# Patient Record
Sex: Female | Born: 1965
Health system: Southern US, Community
[De-identification: ages and names within clinical notes are randomized; demographics above are authoritative.]

## PROBLEM LIST (undated history)

## (undated) DIAGNOSIS — D214 Benign neoplasm of connective and other soft tissue of abdomen: Secondary | ICD-10-CM

## (undated) DIAGNOSIS — E039 Hypothyroidism, unspecified: Secondary | ICD-10-CM

## (undated) DIAGNOSIS — D499 Neoplasm of unspecified behavior of unspecified site: Secondary | ICD-10-CM

## (undated) HISTORY — DX: Hypothyroidism, unspecified: E03.9

## (undated) HISTORY — PX: RESECTION SMALL BOWEL / CLOSURE ILEOSTOMY: SUR1248

## (undated) HISTORY — DX: Benign neoplasm of connective and other soft tissue of abdomen: D21.4

## (undated) HISTORY — PX: OTHER SURGICAL HISTORY: SHX169

## (undated) HISTORY — PX: TUBAL LIGATION: SHX77

## (undated) HISTORY — PX: OVARIAN CYST REMOVAL: SHX89

## (undated) HISTORY — DX: Neoplasm of unspecified behavior of unspecified site: D49.9

---

## 2003-08-30 HISTORY — PX: AUGMENTATION MAMMAPLASTY: SUR837

## 2018-05-01 ENCOUNTER — Encounter: Payer: Self-pay | Admitting: Nurse Practitioner

## 2018-05-01 ENCOUNTER — Ambulatory Visit: Payer: BLUE CROSS/BLUE SHIELD | Admitting: Nurse Practitioner

## 2018-05-01 VITALS — BP 108/68 | HR 72 | Resp 16 | Ht 63.0 in | Wt 131.2 lb

## 2018-05-01 DIAGNOSIS — F5102 Adjustment insomnia: Secondary | ICD-10-CM

## 2018-05-01 DIAGNOSIS — E039 Hypothyroidism, unspecified: Secondary | ICD-10-CM

## 2018-05-01 DIAGNOSIS — C49A3 Gastrointestinal stromal tumor of small intestine: Secondary | ICD-10-CM

## 2018-05-01 DIAGNOSIS — R112 Nausea with vomiting, unspecified: Secondary | ICD-10-CM

## 2018-05-01 MED ORDER — ONDANSETRON HCL 4 MG PO TABS: 4.0000 mg | ORAL_TABLET | Freq: Three times a day (TID) | ORAL | 3 refills | Status: DC | PRN

## 2018-05-01 MED ORDER — ALPRAZOLAM 1 MG PO TABS: 1.0000 mg | ORAL_TABLET | Freq: Every evening | ORAL | 3 refills | Status: DC | PRN

## 2018-05-01 NOTE — Progress Notes (Signed)
[facility] [place], [place] [address]  Internal MEDICINE  Office Visit Note  Patient Name: Yesenia Ward  [id number]  [id number]  Date of Service: 05/09/2018   Complaints/HPI Pt is here for establishment of PCP. Chief Complaint  Patient presents with  . New Patient (Initial Visit)    pt would like to get thyroids checked   The patient has history of hypothyroid. Was seeing endocrinologist. Was having labs done every 6 months. Is coming up to have labs drawn. Thyroid levels have been stable for past several years. Recently started taking Bijuva per her GYN. Has had improved symptoms and sleep since starting on this medication. Has been on this for past 2 months.  Biggest concern is difficulty sleeping. With new bijuva, valerian root, and unisom, she has been sleeping better the past few months. Will take alprazolam at night only if she is still awake 1.5 hours after trying to fall asleeo   Current Medication: Outpatient Encounter Medications as of 05/01/2018  Medication Sig  . acetaminophen (TYLENOL) 500 MG tablet Take 500 mg by mouth every 6 (six) hours as needed.  . ALPRAZolam (XANAX) 1 MG tablet Take 1 tablet (1 mg total) by mouth at bedtime as needed for anxiety.  Marland Kitchen doxylamine, Sleep, (UNISOM) 25 MG tablet Take 25 mg by mouth at bedtime as needed.  . Estradiol-Progesterone (BIJUVA) 1-100 MG CAPS Take by mouth.  . Fexofenadine-Pseudoephedrine (ALLEGRA-D 12 HOUR PO) Take by mouth.  . imatinib (GLEEVEC) 100 MG tablet Take 100 mg by mouth 2 (two) times daily. Take with meals and large glass of water.Caution:Chemotherapy  . methylPREDNISolone (MEDROL DOSEPAK) 4 MG TBPK tablet Take by mouth.  . ondansetron (ZOFRAN) 4 MG tablet Take 1 tablet (4 mg total) by mouth every 8 (eight) hours as needed for nausea or vomiting.  Cristino Martes Root 500 MG CAPS Take by mouth.  . [DISCONTINUED] ALPRAZolam (XANAX) 1 MG tablet Take 1 mg by mouth at bedtime as needed for anxiety.   . [DISCONTINUED] levothyroxine (SYNTHROID) 112 MCG tablet Take 112 mcg by mouth daily before breakfast.  . [DISCONTINUED] ondansetron (ZOFRAN) 4 MG tablet Take 4 mg by mouth every 8 (eight) hours as needed for nausea or vomiting.   No facility-administered encounter medications on file as of 05/01/2018.     Surgical History: Past Surgical History:  Procedure Laterality Date  . gist tumor remooval    . OVARIAN CYST REMOVAL Left   . TUBAL LIGATION      Medical History: Past Medical History:  Diagnosis Date  . GIST (gastrointestinal stromal tumor), non-malignant   . Hypothyroid   . Tumor     Family History: Family History  Problem Relation Age of Onset  . Diabetes Mother   . Coronary artery disease Mother   . Rectal cancer Paternal Aunt   . Cancer Paternal Aunt   . Cirrhosis Father   . Cancer Maternal Uncle     Social History   Socioeconomic History  . Marital status: Married    Spouse name: Not on file  . Number of children: Not on file  . Years of education: Not on file  . Highest education level: Not on file  Occupational History  . Not on file  Social Needs  . Financial resource strain: Not on file  . Food insecurity:    Worry: Not on file    Inability: Not on file  . Transportation needs:    Medical: Not on file    Non-medical:  Not on file  Tobacco Use  . Smoking status: Former Research scientist (life sciences)  . Smokeless tobacco: Never Used  Substance and Sexual Activity  . Alcohol use: Yes    Comment: occasionally  . Drug use: Never  . Sexual activity: Not on file  Lifestyle  . Physical activity:    Days per week: Not on file    Minutes per session: Not on file  . Stress: Not on file  Relationships  . Social connections:    Talks on phone: Not on file    Gets together: Not on file    Attends religious service: Not on file    Active member of club or organization: Not on file    Attends meetings of clubs or organizations: Not on file    Relationship status: Not on  file  . Intimate partner violence:    Fear of current or ex partner: Not on file    Emotionally abused: Not on file    Physically abused: Not on file    Forced sexual activity: Not on file  Other Topics Concern  . Not on file  Social History Narrative  . Not on file     Review of Systems  Constitutional: Negative for activity change, chills, fatigue and unexpected weight change.  HENT: Negative for congestion, postnasal drip, rhinorrhea, sneezing and sore throat.   Eyes: Negative.  Negative for redness.  Respiratory: Negative for cough, chest tightness, shortness of breath and wheezing.   Cardiovascular: Negative for chest pain and palpitations.  Gastrointestinal: Negative for abdominal pain, constipation, diarrhea, nausea and vomiting.  Endocrine: Negative for cold intolerance, heat intolerance, polydipsia, polyphagia and polyuria.       Chronic hypothyroid which has been well-managed.   Genitourinary: Negative.  Negative for dysuria and frequency.  Musculoskeletal: Negative for arthralgias, back pain, joint swelling and neck pain.  Skin: Negative for rash.  Allergic/Immunologic: Positive for environmental allergies.  Neurological: Negative for dizziness, tremors, numbness and headaches.  Hematological: Negative for adenopathy. Does not bruise/bleed easily.  Psychiatric/Behavioral: Positive for sleep disturbance. Negative for behavioral problems (Depression) and suicidal ideas. The patient is nervous/anxious.     Today's Vitals   05/01/18 1140  BP: 108/68  Pulse: 72  Resp: 16  SpO2: 99%  Weight: 131 lb 3.2 oz (59.5 kg)  Height: 5\' 3"  (1.6 m)    Physical Exam  Constitutional: She is oriented to person, place, and time. She appears well-developed and well-nourished. No distress.  HENT:  Head: Normocephalic and atraumatic.  Nose: Nose normal.  Mouth/Throat: Oropharynx is clear and moist. No oropharyngeal exudate.  Eyes: Pupils are equal, round, and reactive to light.  Conjunctivae and EOM are normal.  Neck: Normal range of motion. Neck supple. No JVD present. No tracheal deviation present. No thyromegaly present.  Cardiovascular: Normal rate, regular rhythm and normal heart sounds. Exam reveals no gallop and no friction rub.  No murmur heard. Pulmonary/Chest: Effort normal and breath sounds normal. No respiratory distress. She has no wheezes. She has no rales. She exhibits no tenderness.  Abdominal: Soft. Bowel sounds are normal.  Musculoskeletal: Normal range of motion.  Lymphadenopathy:    She has no cervical adenopathy.  Neurological: She is alert and oriented to person, place, and time. No cranial nerve deficit.  Skin: Skin is warm and dry. She is not diaphoretic.  Psychiatric: She has a normal mood and affect. Her behavior is normal. Judgment and thought content normal.  Nursing note and vitals reviewed.  Assessment/Plan: 1. Acquired hypothyroidism  Will check thyroid panel and adjust levothyroxine as indicated.   2. Adjustment insomnia May continue to take alprazolam 1mg  at bedtime, only when needed for insomnia. New prescription provided today.  - ALPRAZolam (XANAX) 1 MG tablet; Take 1 tablet (1 mg total) by mouth at bedtime as needed for anxiety.  Dispense: 30 tablet; Refill: 3  3. Nausea and vomiting, intractability of vomiting not specified, unspecified vomiting type Associated with taking Gleevac - continue zofran 4mg  tablets daily when needed for nausea. Refills provided today.  - ondansetron (ZOFRAN) 4 MG tablet; Take 1 tablet (4 mg total) by mouth every 8 (eight) hours as needed for nausea or vomiting.  Dispense: 30 tablet; Refill: 3  4. 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   General Counseling: larose batres understanding of the findings of todays visit and agrees with plan of treatment. I have discussed any further  diagnostic evaluation that may be needed or ordered today. We also reviewed her medications today. she has been encouraged to call the office with any questions or concerns that should arise related to todays visit.    Counseling:   This patient was seen by [place] with Dr Lavera Guise as a part of collaborative care agreement   Meds ordered this encounter  Medications  . ondansetron (ZOFRAN) 4 MG tablet    Sig: Take 1 tablet (4 mg total) by mouth every 8 (eight) hours as needed for nausea or vomiting.    Dispense:  30 tablet    Refill:  3    Order Specific Question:   Supervising Provider    Answer:   Lavera Guise [[id number]]  . ALPRAZolam (XANAX) 1 MG tablet    Sig: Take 1 tablet (1 mg total) by mouth at bedtime as needed for anxiety.    Dispense:  30 tablet    Refill:  3    Order Specific Question:   Supervising Provider    Answer:   Lavera Guise [[id number]]    Time spent: 20 Minutes

## 2018-05-04 ENCOUNTER — Other Ambulatory Visit: Payer: Self-pay | Admitting: Nurse Practitioner

## 2018-05-05 LAB — BASIC METABOLIC PANEL
BUN/Creatinine Ratio: 22 (ref 9–23)
BUN: 17 mg/dL (ref 6–24)
CO2: 22 mmol/L (ref 20–29)
CREATININE: 0.76 mg/dL (ref 0.57–1.00)
Calcium: 10 mg/dL (ref 8.7–10.2)
Chloride: 102 mmol/L (ref 96–106)
GFR calc Af Amer: 104 mL/min/{1.73_m2} (ref >59)
GFR calc non Af Amer: 90 mL/min/{1.73_m2} (ref >59)
GLUCOSE: 95 mg/dL (ref 65–99)
Potassium: 4.4 mmol/L (ref 3.5–5.2)
Sodium: 142 mmol/L (ref 134–144)

## 2018-05-05 LAB — CBC
HEMOGLOBIN: 13.4 g/dL (ref 11.1–15.9)
Hematocrit: 40.2 % (ref 34.0–46.6)
MCH: 31.3 pg (ref 26.6–33.0)
MCHC: 33.3 g/dL (ref 31.5–35.7)
MCV: 94 fL (ref 79–97)
Platelets: 306 10*3/uL (ref 150–450)
RBC: 4.28 x10E6/uL (ref 3.77–5.28)
RDW: 11.7 % — ABNORMAL LOW (ref 12.3–15.4)
WBC: 6.9 10*3/uL (ref 3.4–10.8)

## 2018-05-05 LAB — TSH: TSH: 1.33 u[IU]/mL (ref 0.450–4.500)

## 2018-05-05 LAB — T4, FREE: FREE T4: 1.49 ng/dL (ref 0.82–1.77)

## 2018-05-05 LAB — T4: T4, Total: 8.7 ug/dL (ref 4.5–12.0)

## 2018-05-08 ENCOUNTER — Other Ambulatory Visit: Payer: Self-pay

## 2018-05-09 ENCOUNTER — Telehealth: Payer: Self-pay

## 2018-05-09 ENCOUNTER — Other Ambulatory Visit: Payer: Self-pay

## 2018-05-09 ENCOUNTER — Encounter: Payer: Self-pay | Admitting: Nurse Practitioner

## 2018-05-09 ENCOUNTER — Other Ambulatory Visit: Payer: Self-pay | Admitting: Nurse Practitioner

## 2018-05-09 DIAGNOSIS — E039 Hypothyroidism, unspecified: Secondary | ICD-10-CM

## 2018-05-09 DIAGNOSIS — C49A3 Gastrointestinal stromal tumor of small intestine: Secondary | ICD-10-CM | POA: Insufficient documentation

## 2018-05-09 DIAGNOSIS — F5102 Adjustment insomnia: Secondary | ICD-10-CM | POA: Insufficient documentation

## 2018-05-09 DIAGNOSIS — R112 Nausea with vomiting, unspecified: Secondary | ICD-10-CM | POA: Insufficient documentation

## 2018-05-09 DIAGNOSIS — G4709 Other insomnia: Secondary | ICD-10-CM | POA: Insufficient documentation

## 2018-05-09 MED ORDER — LEVOTHYROXINE SODIUM 112 MCG PO TABS: 112.0000 ug | ORAL_TABLET | Freq: Every day | ORAL | 5 refills | Status: DC

## 2018-05-09 MED ORDER — LEVOTHYROXINE SODIUM 112 MCG PO TABS: 112.0000 ug | ORAL_TABLET | Freq: Every day | ORAL | 1 refills | Status: DC

## 2018-05-09 NOTE — Telephone Encounter (Signed)
Labs looked great. Thyroid panel perfect. Renewed her levothyroxine 118mcg and sent to her pharmacy. Recheck in 6 months.

## 2018-05-09 NOTE — Telephone Encounter (Signed)
PT ADVISED BY JODY ABOUT LABS RESULT LOOKS GOOD AND SEND HER MED TO [facility]

## 2018-05-09 NOTE — Progress Notes (Signed)
Labs looked great. Thyroid panel perfect. Renewed her levothyroxine 177mcg and sent to her pharmacy. Recheck in 6 months.

## 2018-06-01 ENCOUNTER — Ambulatory Visit (INDEPENDENT_AMBULATORY_CARE_PROVIDER_SITE_OTHER): Payer: BLUE CROSS/BLUE SHIELD

## 2018-06-01 DIAGNOSIS — Z23 Encounter for immunization: Secondary | ICD-10-CM

## 2018-06-11 ENCOUNTER — Telehealth: Payer: Self-pay | Admitting: Nurse Practitioner

## 2018-06-11 NOTE — Telephone Encounter (Signed)
PT IS WANTING A REFERRAL TO A GOOD RHEUMOTOLOGIST SHE STARTS WORKING IN TWO WEEKS FOR [employer] AND WOULD LIKE TO BE SEEN IF POSSIBLE BEFORE SHE STARTS WORKING. PATIENT WAS SEEN AT [facility] FOR A SPOT ON EYE THAT WAS CALLED SCLERITIS

## 2018-06-11 NOTE — Telephone Encounter (Signed)
Ok. Can you give this to beth. Let her know she needs referral to rheumatology. 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 thanks

## 2018-08-15 ENCOUNTER — Telehealth: Payer: Self-pay | Admitting: Nurse Practitioner

## 2018-08-15 NOTE — Telephone Encounter (Signed)
Putting lab slip checking bmp, cbc, iron panel with b12 and folate, magnesium, and phosphorus, on your desk.

## 2018-08-15 NOTE — Telephone Encounter (Signed)
She would like to have labs drawn , she is having weird warm sensations that started in foot then went away and now on cheekbone that goes and comes, she has had b12 injections in past and is wondering if this could be a symptom of deficiency

## 2018-08-21 ENCOUNTER — Other Ambulatory Visit: Payer: Self-pay | Admitting: Nurse Practitioner

## 2018-08-22 LAB — BASIC METABOLIC PANEL
BUN/Creatinine Ratio: 32 — ABNORMAL HIGH (ref 9–23)
BUN: 19 mg/dL (ref 6–24)
CO2: 22 mmol/L (ref 20–29)
Calcium: 9.1 mg/dL (ref 8.7–10.2)
Chloride: 102 mmol/L (ref 96–106)
Creatinine, Ser: 0.6 mg/dL (ref 0.57–1.00)
GFR calc Af Amer: 121 mL/min/{1.73_m2} (ref >59)
GFR calc non Af Amer: 105 mL/min/{1.73_m2} (ref >59)
Glucose: 83 mg/dL (ref 65–99)
Potassium: 4.3 mmol/L (ref 3.5–5.2)
Sodium: 137 mmol/L (ref 134–144)

## 2018-08-22 LAB — MAGNESIUM: Magnesium: 1.7 mg/dL (ref 1.6–2.3)

## 2018-08-22 LAB — IRON AND TIBC
IRON SATURATION: 21 % (ref 15–55)
Iron: 76 ug/dL (ref 27–159)
Total Iron Binding Capacity: 360 ug/dL (ref 250–450)
UIBC: 284 ug/dL (ref 131–425)

## 2018-08-22 LAB — CBC
Hematocrit: 38.4 % (ref 34.0–46.6)
Hemoglobin: 13 g/dL (ref 11.1–15.9)
MCH: 31.9 pg (ref 26.6–33.0)
MCHC: 33.9 g/dL (ref 31.5–35.7)
MCV: 94 fL (ref 79–97)
Platelets: 303 10*3/uL (ref 150–450)
RBC: 4.07 x10E6/uL (ref 3.77–5.28)
RDW: 12.5 % (ref 12.3–15.4)
WBC: 4.2 10*3/uL (ref 3.4–10.8)

## 2018-08-22 LAB — FERRITIN: Ferritin: 40 ng/mL (ref 15–150)

## 2018-08-22 LAB — B12 AND FOLATE PANEL
Folate: 7.8 ng/mL (ref >3.0)
Vitamin B-12: 480 pg/mL (ref 232–1245)

## 2018-08-22 LAB — PHOSPHORUS: Phosphorus: 3 mg/dL (ref 2.5–4.5)

## 2018-09-26 ENCOUNTER — Other Ambulatory Visit: Payer: Self-pay | Admitting: Nurse Practitioner

## 2018-09-26 DIAGNOSIS — Z20828 Contact with and (suspected) exposure to other viral communicable diseases: Secondary | ICD-10-CM

## 2018-09-26 MED ORDER — OSELTAMIVIR PHOSPHATE 75 MG PO CAPS: 75.0000 mg | ORAL_CAPSULE | Freq: Two times a day (BID) | ORAL | 0 refills | Status: DC

## 2018-09-26 NOTE — Progress Notes (Signed)
Patient's husband diagnosed with flu. Sent in tamiflu 75mg  twice daily for 5 days to [facility].

## 2018-10-12 ENCOUNTER — Ambulatory Visit: Payer: Self-pay | Admitting: Adult Health

## 2018-10-12 DIAGNOSIS — Z411 Encounter for cosmetic surgery: Secondary | ICD-10-CM

## 2018-10-15 ENCOUNTER — Other Ambulatory Visit: Payer: Self-pay

## 2018-10-15 DIAGNOSIS — E039 Hypothyroidism, unspecified: Secondary | ICD-10-CM

## 2018-10-15 MED ORDER — LEVOTHYROXINE SODIUM 112 MCG PO TABS: 112.0000 ug | ORAL_TABLET | Freq: Every day | ORAL | 0 refills | Status: DC

## 2018-10-25 ENCOUNTER — Ambulatory Visit: Payer: 59 | Admitting: Adult Health

## 2018-10-25 DIAGNOSIS — Z9229 Personal history of other drug therapy: Secondary | ICD-10-CM

## 2018-10-26 NOTE — Progress Notes (Signed)
Patient treated with Botox Cosmetic/ OnabotulinumtoxinA  Risks and Benefits explained to patient, Written consent obtained and signed.   Lot#: [id number] Exp: 03/2021  Glabellar: 6 Units Frontalis: 12 units Crows Feet: 0 Units Other: 4 Units  Total: 21 Units  Aftercare instructions given to patient.  Patient denied any questions.  Will follow up in 14 days for results review.

## 2018-11-01 ENCOUNTER — Ambulatory Visit: Payer: Self-pay | Admitting: Nurse Practitioner

## 2018-11-22 ENCOUNTER — Ambulatory Visit: Payer: 59 | Admitting: Nurse Practitioner

## 2018-11-22 ENCOUNTER — Encounter: Payer: Self-pay | Admitting: Nurse Practitioner

## 2018-11-22 ENCOUNTER — Other Ambulatory Visit: Payer: Self-pay

## 2018-11-22 VITALS — BP 130/81 | HR 82 | Resp 16 | Ht 63.0 in | Wt 126.5 lb

## 2018-11-22 DIAGNOSIS — E039 Hypothyroidism, unspecified: Secondary | ICD-10-CM

## 2018-11-22 DIAGNOSIS — F5102 Adjustment insomnia: Secondary | ICD-10-CM

## 2018-11-22 DIAGNOSIS — R112 Nausea with vomiting, unspecified: Secondary | ICD-10-CM

## 2018-11-22 MED ORDER — ALPRAZOLAM 1 MG PO TABS: 1.0000 mg | ORAL_TABLET | Freq: Every evening | ORAL | 3 refills | Status: DC | PRN

## 2018-11-22 MED ORDER — LEVOTHYROXINE SODIUM 112 MCG PO TABS: 112.0000 ug | ORAL_TABLET | Freq: Every day | ORAL | 1 refills | Status: DC

## 2018-11-22 MED ORDER — ONDANSETRON HCL 4 MG PO TABS: 4.0000 mg | ORAL_TABLET | Freq: Three times a day (TID) | ORAL | 3 refills | Status: DC | PRN

## 2018-11-22 NOTE — Progress Notes (Signed)
[facility] [place], [place] [address]  Internal MEDICINE  Office Visit Note  Patient Name: Yesenia Ward  [id number]  [id number]  Date of Service: 12/03/2018  Chief Complaint  Patient presents with  . Medical Management of Chronic Issues    6 month follow up, medication refills  . Labs Only    pt printed out CT results would like to discuss them    The patient presents for routine follow up visit. Recent thyroid panel was within normal limits. She does continue to use alprazolam at night as needed to reduce anxiety and help her sleep well.  She recently had CT scan for surveillance of GIST tumor. No new or acute findings were present. Did show minimal bibasilar atelectasis. Will continue to have yearly CT scans done for monitoring. She has no symptoms, such as chest pain, chest pressure, or shortness of breath. She does not smoke.       Current Medication: Outpatient Encounter Medications as of 11/22/2018  Medication Sig  . acetaminophen (TYLENOL) 500 MG tablet Take 500 mg by mouth every 6 (six) hours as needed.  . ALPRAZolam (XANAX) 1 MG tablet Take 1 tablet (1 mg total) by mouth at bedtime as needed for anxiety.  Marland Kitchen doxylamine, Sleep, (UNISOM) 25 MG tablet Take 25 mg by mouth at bedtime as needed.  . Estradiol-Progesterone (BIJUVA) 1-100 MG CAPS Take by mouth.  . Fexofenadine-Pseudoephedrine (ALLEGRA-D 12 HOUR PO) Take by mouth.  . imatinib (GLEEVEC) 100 MG tablet Take 100 mg by mouth 2 (two) times daily. Take with meals and large glass of water.Caution:Chemotherapy  . levothyroxine (SYNTHROID) 112 MCG tablet Take 1 tablet (112 mcg total) by mouth daily before breakfast.  . methylPREDNISolone (MEDROL DOSEPAK) 4 MG TBPK tablet Take by mouth.  . ondansetron (ZOFRAN) 4 MG tablet Take 1 tablet (4 mg total) by mouth every 8 (eight) hours as needed for nausea or vomiting.  Marland Kitchen oseltamivir (TAMIFLU) 75 MG capsule Take 1 capsule (75 mg total) by mouth 2 (two) times  daily.  Cristino Martes Root 500 MG CAPS Take by mouth.  . [DISCONTINUED] ALPRAZolam (XANAX) 1 MG tablet Take 1 tablet (1 mg total) by mouth at bedtime as needed for anxiety.  . [DISCONTINUED] levothyroxine (SYNTHROID) 112 MCG tablet Take 1 tablet (112 mcg total) by mouth daily before breakfast.  . [DISCONTINUED] ondansetron (ZOFRAN) 4 MG tablet Take 1 tablet (4 mg total) by mouth every 8 (eight) hours as needed for nausea or vomiting.   No facility-administered encounter medications on file as of 11/22/2018.     Surgical History: Past Surgical History:  Procedure Laterality Date  . gist tumor remooval    . OVARIAN CYST REMOVAL Left   . TUBAL LIGATION      Medical History: Past Medical History:  Diagnosis Date  . GIST (gastrointestinal stromal tumor), non-malignant   . Hypothyroid   . Tumor     Family History: Family History  Problem Relation Age of Onset  . Diabetes Mother   . Coronary artery disease Mother   . Rectal cancer Paternal Aunt   . Cancer Paternal Aunt   . Cirrhosis Father   . Cancer Maternal Uncle     Social History   Socioeconomic History  . Marital status: Married    Spouse name: Not on file  . Number of children: Not on file  . Years of education: Not on file  . Highest education level: Not on file  Occupational History  . Not on  file  Social Needs  . Financial resource strain: Not on file  . Food insecurity:    Worry: Not on file    Inability: Not on file  . Transportation needs:    Medical: Not on file    Non-medical: Not on file  Tobacco Use  . Smoking status: Former Research scientist (life sciences)  . Smokeless tobacco: Never Used  Substance and Sexual Activity  . Alcohol use: Yes    Comment: occasionally  . Drug use: Never  . Sexual activity: Not on file  Lifestyle  . Physical activity:    Days per week: Not on file    Minutes per session: Not on file  . Stress: Not on file  Relationships  . Social connections:    Talks on phone: Not on file    Gets  together: Not on file    Attends religious service: Not on file    Active member of club or organization: Not on file    Attends meetings of clubs or organizations: Not on file    Relationship status: Not on file  . Intimate partner violence:    Fear of current or ex partner: Not on file    Emotionally abused: Not on file    Physically abused: Not on file    Forced sexual activity: Not on file  Other Topics Concern  . Not on file  Social History Narrative  . Not on file      Review of Systems  Constitutional: Negative for activity change, chills, fatigue and unexpected weight change.  HENT: Negative for congestion, postnasal drip, rhinorrhea, sneezing and sore throat.   Respiratory: Negative for cough, chest tightness, shortness of breath and wheezing.   Cardiovascular: Negative for chest pain and palpitations.  Gastrointestinal: Positive for nausea. Negative for abdominal pain, constipation, diarrhea and vomiting.  Endocrine: Negative for cold intolerance, heat intolerance, polydipsia and polyuria.       Chronic hypothyroid which has been well-managed.   Musculoskeletal: Negative for arthralgias, back pain, joint swelling and neck pain.  Skin: Negative for rash.  Allergic/Immunologic: Positive for environmental allergies.  Neurological: Negative for dizziness, tremors, numbness and headaches.  Hematological: Negative for adenopathy. Does not bruise/bleed easily.  Psychiatric/Behavioral: Positive for sleep disturbance. Negative for behavioral problems (Depression) and suicidal ideas. The patient is nervous/anxious.     Today's Vitals   11/22/18 1615  BP: 130/81  Pulse: 82  Resp: 16  SpO2: 99%  Weight: 126 lb 8 oz (57.4 kg)  Height: 5\' 3"  (1.6 m)   Body mass index is 22.41 kg/m.  Physical Exam Vitals signs and nursing note reviewed.  Constitutional:      General: She is not in acute distress.    Appearance: Normal appearance. She is well-developed. She is not  diaphoretic.  HENT:     Head: Normocephalic and atraumatic.     Nose: Nose normal.     Mouth/Throat:     Pharynx: No oropharyngeal exudate.  Eyes:     Conjunctiva/sclera: Conjunctivae normal.     Pupils: Pupils are equal, round, and reactive to light.  Neck:     Musculoskeletal: Normal range of motion and neck supple.     Thyroid: No thyromegaly.     Vascular: No JVD.     Trachea: No tracheal deviation.  Cardiovascular:     Rate and Rhythm: Normal rate and regular rhythm.     Heart sounds: Normal heart sounds. No murmur. No friction rub. No gallop.   Pulmonary:  Effort: Pulmonary effort is normal. No respiratory distress.     Breath sounds: Normal breath sounds. No wheezing or rales.  Chest:     Chest wall: No tenderness.  Abdominal:     General: Bowel sounds are normal.     Palpations: Abdomen is soft.  Musculoskeletal: Normal range of motion.  Lymphadenopathy:     Cervical: No cervical adenopathy.  Skin:    General: Skin is warm and dry.  Neurological:     Mental Status: She is alert and oriented to person, place, and time.     Cranial Nerves: No cranial nerve deficit.  Psychiatric:        Behavior: Behavior normal.        Thought Content: Thought content normal.        Judgment: Judgment normal.   Assessment/Plan: 1. Acquired hypothyroidism Recent thyroid panel stable. Continue levothyroxine at current dose. Refills provided today.  - levothyroxine (SYNTHROID) 112 MCG tablet; Take 1 tablet (112 mcg total) by mouth daily before breakfast.  Dispense: 90 tablet; Refill: 1  2. Nausea and vomiting, intractability of vomiting not specified, unspecified vomiting type Continue to take Sofran 4mg  as needed and as prescribed for nausea.  - ondansetron (ZOFRAN) 4 MG tablet; Take 1 tablet (4 mg total) by mouth every 8 (eight) hours as needed for nausea or vomiting.  Dispense: 30 tablet; Refill: 3  3. Adjustment insomnia Alprazolam 1mg  may be taken at bedtime as needed for  anxiety/insomnia.  - ALPRAZolam (XANAX) 1 MG tablet; Take 1 tablet (1 mg total) by mouth at bedtime as needed for anxiety.  Dispense: 30 tablet; Refill: 3  General Counseling: Virdell verbalizes understanding of the findings of todays visit and agrees with plan of treatment. I have discussed any further diagnostic evaluation that may be needed or ordered today. We also reviewed her medications today. she has been encouraged to call the office with any questions or concerns that should arise related to todays visit.  Reviewed risks and possible side effects associated with taking opiates, benzodiazepines and other CNS depressants. Combination of these could cause dizziness and drowsiness. Advised patient not to drive or operate machinery when taking these medications, as patient's and other's life can be at risk and will have consequences. Patient verbalized understanding in this matter. Dependence and abuse for these drugs will be monitored closely. A Controlled substance policy and procedure is on file which allows [facility] to order a urine drug screen test at any visit. Patient understands and agrees with the plan  This patient was seen by Leretha Pol FNP Collaboration with Dr Lavera Guise as a part of collaborative care agreement  Meds ordered this encounter  Medications  . ALPRAZolam (XANAX) 1 MG tablet    Sig: Take 1 tablet (1 mg total) by mouth at bedtime as needed for anxiety.    Dispense:  30 tablet    Refill:  3    Order Specific Question:   Supervising Provider    Answer:   Lavera Guise [[id number]]  . levothyroxine (SYNTHROID) 112 MCG tablet    Sig: Take 1 tablet (112 mcg total) by mouth daily before breakfast.    Dispense:  90 tablet    Refill:  1    Order Specific Question:   Supervising Provider    Answer:   Lavera Guise [[id number]]  . ondansetron (ZOFRAN) 4 MG tablet    Sig: Take 1 tablet (4 mg total) by mouth every 8 (eight) hours as  needed for nausea or vomiting.     Dispense:  30 tablet    Refill:  3    Order Specific Question:   Supervising Provider    Answer:   Lavera Guise [[id number]]    Time spent: 66 Minutes      Dr Lavera Guise Internal medicine

## 2018-11-28 ENCOUNTER — Encounter: Payer: Self-pay | Admitting: Adult Health

## 2018-11-28 NOTE — Progress Notes (Signed)
Patient treated with Botox Cosmetic/ OnabotulinumtoxinA  Risks and Benefits explained to patient, Written consent obtained and signed.   Lot#: [id number] Exp: 03/2021  Glabellar: 0 Units Frontalis: 26 units Crows Feet: 24 Units Other: 0 Units  Total: 50 Units  Aftercare instructions given to patient.  Patient denied any questions.  Will follow up in 14 days for results review.

## 2018-12-09 ENCOUNTER — Emergency Department: Payer: 59

## 2018-12-09 ENCOUNTER — Other Ambulatory Visit: Payer: Self-pay

## 2018-12-09 ENCOUNTER — Encounter: Payer: Self-pay | Admitting: Emergency Medicine

## 2018-12-09 ENCOUNTER — Emergency Department
Admission: EM | Admit: 2018-12-09 | Discharge: 2018-12-09 | Disposition: A | Discharge status: Home / Self Care | Payer: 59 | Attending: Emergency Medicine | Admitting: Emergency Medicine

## 2018-12-09 DIAGNOSIS — Z23 Encounter for immunization: Secondary | ICD-10-CM | POA: Insufficient documentation

## 2018-12-09 DIAGNOSIS — Y939 Activity, unspecified: Secondary | ICD-10-CM | POA: Diagnosis not present

## 2018-12-09 DIAGNOSIS — W268XXA Contact with other sharp object(s), not elsewhere classified, initial encounter: Secondary | ICD-10-CM | POA: Insufficient documentation

## 2018-12-09 DIAGNOSIS — Z87891 Personal history of nicotine dependence: Secondary | ICD-10-CM | POA: Insufficient documentation

## 2018-12-09 DIAGNOSIS — Y999 Unspecified external cause status: Secondary | ICD-10-CM | POA: Insufficient documentation

## 2018-12-09 DIAGNOSIS — Z79899 Other long term (current) drug therapy: Secondary | ICD-10-CM | POA: Insufficient documentation

## 2018-12-09 DIAGNOSIS — S61211A Laceration without foreign body of left index finger without damage to nail, initial encounter: Secondary | ICD-10-CM

## 2018-12-09 DIAGNOSIS — Y92000 Kitchen of unspecified non-institutional (private) residence as  the place of occurrence of the external cause: Secondary | ICD-10-CM | POA: Insufficient documentation

## 2018-12-09 MED ORDER — TETANUS-DIPHTH-ACELL PERTUSSIS 5-2.5-18.5 LF-MCG/0.5 IM SUSP
0.5000 mL | Freq: Once | INTRAMUSCULAR | Status: AC
  Administered 2018-12-09: 12:00:00 0.5 mL via INTRAMUSCULAR
  Filled 2018-12-09: qty 0.5

## 2018-12-09 MED ORDER — CEPHALEXIN 500 MG PO CAPS: 500.0000 mg | ORAL_CAPSULE | Freq: Three times a day (TID) | ORAL | 0 refills | Status: DC

## 2018-12-09 MED ORDER — FLUCONAZOLE 150 MG PO TABS: 150.0000 mg | ORAL_TABLET | Freq: Every day | ORAL | 0 refills | Status: DC

## 2018-12-09 NOTE — ED Notes (Signed)

## 2018-12-09 NOTE — ED Triage Notes (Signed)
Pt presents to ED via POV with laceration to L index finger. Pt states laceration happened at approx midnight last night. Bleeding controlled with bandage placed prior to arrival.

## 2018-12-09 NOTE — Discharge Instructions (Signed)
Follow-up with your primary care provider if any continued problems or concerns.  Watch the area for any signs of infection and remove the Steri-Strips if you see any drainage.  A prescription for Keflex was sent to your pharmacy as well as a Diflucan in case you get a yeast infection.  This should keep the area from getting infected.  Take Tylenol if needed for pain.  You may also return to the emergency department if any urgent concerns.

## 2018-12-09 NOTE — ED Provider Notes (Signed)
[facility] Emergency Department Provider Note   ____________________________________________   First MD Initiated Contact with Patient 12/09/18 1113     (approximate)  I have reviewed the triage vital signs and the nursing notes.   HISTORY  Chief Complaint Laceration   HPI Yesenia Ward is a 53 y.o. female presents to the ED with complaint of laceration to her left finger that occurred at approximately midnight last evening when she broke a dish.  She states that there was a lot of bleeding but she was able to control the bleeding with pressure dressing until today.  She is unaware of her tetanus immunizations.  She rates her pain as 6 out of 10.       Past Medical History:  Diagnosis Date  . GIST (gastrointestinal stromal tumor), non-malignant   . Hypothyroid   . Tumor     Patient Active Problem List   Diagnosis Date Noted  . Acquired hypothyroidism 05/09/2018  . Adjustment insomnia 05/09/2018  . Nausea and vomiting 05/09/2018  . 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 gist tumor remooval    . OVARIAN CYST REMOVAL Left   . TUBAL LIGATION      Prior to Admission medications   Medication Sig Start Date End Date Taking? Authorizing Provider  acetaminophen (TYLENOL) 500 MG tablet Take 500 mg by mouth every 6 (six) hours as needed.    [provider]  ALPRAZolam Duanne Moron) 1 MG tablet Take 1 tablet (1 mg total) by mouth at bedtime as needed for anxiety. 11/22/18   Ronnell Freshwater, NP  cephALEXin (KEFLEX) 500 MG capsule Take 1 capsule (500 mg total) by mouth 3 (three) times daily. 12/09/18   Johnn Hai, PA-C  doxylamine, Sleep, (UNISOM) 25 MG tablet Take 25 mg by mouth at bedtime as needed.    [provider]  Estradiol-Progesterone (BIJUVA) 1-100 MG CAPS Take by mouth.    [provider]   Fexofenadine-Pseudoephedrine (ALLEGRA-D 12 HOUR PO) Take by mouth.    [provider]  fluconazole (DIFLUCAN) 150 MG tablet Take 1 tablet (150 mg total) by mouth daily. 12/09/18   Johnn Hai, PA-C  imatinib (GLEEVEC) 100 MG tablet Take 100 mg by mouth 2 (two) times daily. Take with meals and large glass of water.Caution:Chemotherapy    [provider]  levothyroxine (SYNTHROID) 112 MCG tablet Take 1 tablet (112 mcg total) by mouth daily before breakfast. 11/22/18   Ronnell Freshwater, NP  Valerian Root 500 MG CAPS Take by mouth.    [provider]    Allergies Gabapentin; Amoxicillin-pot clavulanate; Other; Soy allergy; and Sulfamethoxazole-trimethoprim  Family History  Problem Relation Age of Onset  . Diabetes Mother   . Coronary artery disease Mother   . Rectal cancer Paternal Aunt   . Cancer Paternal Aunt   . Cirrhosis Father   . Cancer Maternal Uncle     Social History Social History   Tobacco Use  . Smoking status: Former Research scientist (life sciences)  . Smokeless tobacco: Never Used  Substance Use Topics  . Alcohol use: Yes    Comment: occasionally  . Drug use: Never    Review of Systems Constitutional: No fever/chills Cardiovascular: Denies chest pain. Respiratory: Denies shortness of breath. Musculoskeletal: Positive pain left index finger. Skin: Laceration left index finger. Neurological: Negative for  focal weakness or numbness. ____________________________________________   PHYSICAL EXAM:  VITAL SIGNS: ED  Triage Vitals  Enc Vitals Group     BP 12/09/18 1110 (!) 136/93     Pulse Rate 12/09/18 1110 76     Resp 12/09/18 1110 18     Temp 12/09/18 1110 97.9 F (36.6 C)     Temp Source 12/09/18 1110 Oral     SpO2 12/09/18 1110 100 %     Weight 12/09/18 1104 126 lb (57.2 kg)     Height 12/09/18 1104 5\' 3"  (1.6 m)     Head Circumference --      Peak Flow --      Pain Score 12/09/18 1104 6     Pain Loc --      Pain Edu? --      Excl. in [place]? --      Constitutional: Alert and oriented. Well appearing and in no acute distress. Eyes: Conjunctivae are normal.  Head: Atraumatic. Neck: No stridor.   Cardiovascular:   Good peripheral circulation. Respiratory: Normal respiratory effort.  No retractions. Musculoskeletal: Examination of left index finger there is no gross deformity and minimal soft tissue edema is noted at the site of laceration.  No foreign body is noted.  No involvement of the nail.  No active bleeding.  There is a superficial 1 cm laceration to the lateral aspect of the left index finger.  Patient is able to flex and extend.  Laceration does not gape with flexion extension.  Peripheral circulation intact.  Motor sensory function intact. Neurologic:  Normal speech and language. No gross focal neurologic deficits are appreciated.  Skin:  Skin is warm, dry.  Laceration as noted above. Psychiatric: Mood and affect are normal. Speech and behavior are normal.  ____________________________________________   LABS (all labs ordered are listed, but only abnormal results are displayed)  Labs Reviewed - No data to display  RADIOLOGY  ED MD interpretation:   Left index finger is negative for opaque foreign body or bony abnormality.  Official radiology report(s): Dg Finger Index Left  Result Date: 12/09/2018 CLINICAL DATA:  Laceration.  Rule out foreign body. EXAM: LEFT INDEX FINGER 2+V COMPARISON:  None. FINDINGS: There is no evidence of fracture or dislocation. There is no evidence of arthropathy or other focal bone abnormality. Soft tissues are unremarkable. IMPRESSION: Negative. Electronically Signed   By: Franchot Gallo M.D.   On: 12/09/2018 12:11    ____________________________________________   PROCEDURES  Procedure(s) performed (including Critical Care):  Marland KitchenMarland KitchenLaceration Repair Date/Time: 12/09/2018 12:53 PM Performed by: Johnn Hai, PA-C Authorized by: Johnn Hai, PA-C   Consent:    Consent obtained:   Verbal   Consent given by:  Patient   Risks discussed:  Infection Anesthesia (see MAR for exact dosages):    Anesthesia method:  None Laceration details:    Location:  Finger   Finger location:  L index finger   Length (cm):  1 Pre-procedure details:    Preparation:  Imaging obtained to evaluate for foreign bodies Exploration:    Contaminated: no   Treatment:    Area cleansed with:  Betadine and saline   Amount of cleaning:  Standard   Visualized foreign bodies/material removed: no   Skin repair:    Repair method:  Steri-Strips Approximation:    Approximation:  Loose Post-procedure details:    Dressing:  Non-adherent dressing and splint for protection   Patient tolerance of procedure:  Tolerated well, no immediate complications     ____________________________________________   INITIAL IMPRESSION / ASSESSMENT AND PLAN / ED COURSE  As part of my medical decision making, I reviewed the following data within the electronic MEDICAL RECORD NUMBER Notes from prior ED visits and [place] Controlled Substance Database  53 year old female presents to the ED with a laceration to her left index finger that occurred approximately 12 hours ago.  Patient was given a tetanus booster.  X-ray was negative for any opaque foreign bodies.  On exam area is very superficial and there is no active bleeding.  Patient is able to flex and extend her digit without any difficulties other than discomfort.  Hand was soaked in saline and Betadine for approximately 20 minutes.  Area was cleaned and wound was again inspected with no foreign body noted.  Benzoin and Steri-Strip was placed.  Patient is aware that if there is any signs of infection she should remove this.  She was placed on Keflex 500 mg 3 times daily for 7 days for infection prevention.  She was also given a Diflucan if needed for yeast infection.  Patient is a Marine scientist at [employer] and was given a note stating that she would need to have limited use of her left hand  and also to keep the area clean and dry.  ____________________________________________   FINAL CLINICAL IMPRESSION(S) / ED DIAGNOSES  Final diagnoses:  Laceration of left index finger without foreign body without damage to nail, initial encounter     ED Discharge Orders         Ordered    cephALEXin (KEFLEX) 500 MG capsule  3 times daily     12/09/18 1249    fluconazole (DIFLUCAN) 150 MG tablet  Daily     12/09/18 1249           Note:  This document was prepared using Dragon voice recognition software and may include unintentional dictation errors.    Johnn Hai, PA-C 12/09/18 1326    Lavonia Drafts, MD 12/09/18 1327

## 2019-01-28 ENCOUNTER — Telehealth: Payer: Self-pay

## 2019-01-28 ENCOUNTER — Other Ambulatory Visit: Payer: Self-pay | Admitting: Internal Medicine

## 2019-01-28 DIAGNOSIS — W57XXXA Bitten or stung by nonvenomous insect and other nonvenomous arthropods, initial encounter: Secondary | ICD-10-CM

## 2019-01-28 NOTE — Telephone Encounter (Signed)
Left a message for patient to call back regarding a lab test order. Per Dr Humphrey Rolls ok to fax lab slip to patient as long as she has all the cpt ordering codes needed. Beth

## 2019-02-20 ENCOUNTER — Ambulatory Visit: Payer: Self-pay | Admitting: Adult Health

## 2019-02-20 ENCOUNTER — Other Ambulatory Visit: Payer: Self-pay

## 2019-02-20 DIAGNOSIS — Z411 Encounter for cosmetic surgery: Secondary | ICD-10-CM

## 2019-02-20 NOTE — Progress Notes (Signed)
Patient treated with Botox Cosmetic/ OnabotulinumtoxinA  Risks and Benefits explained to patient, Written consent obtained and signed.   Lot#: [id number] Exp: 08/2021  Glabellar: 6 Units Frontalis: 29 units Crows Feet: 24 Units Other: 0 Units  Total: 55 Units  Aftercare instructions given to patient.  Patient denied any questions.  Will follow up in 14 days for results review.

## 2019-03-28 ENCOUNTER — Ambulatory Visit: Payer: Managed Care, Other (non HMO) | Admitting: Nurse Practitioner

## 2019-04-22 ENCOUNTER — Telehealth: Payer: Self-pay

## 2019-04-22 NOTE — Telephone Encounter (Signed)
Called patient in regards to refill request received from pharmacy. Lmom informing patient they needed an appointment to receive further refills.

## 2019-05-08 ENCOUNTER — Other Ambulatory Visit: Payer: Self-pay

## 2019-05-08 ENCOUNTER — Ambulatory Visit: Payer: Self-pay | Admitting: Adult Health

## 2019-05-08 ENCOUNTER — Other Ambulatory Visit: Payer: Self-pay | Admitting: Nurse Practitioner

## 2019-05-08 DIAGNOSIS — E039 Hypothyroidism, unspecified: Secondary | ICD-10-CM

## 2019-05-08 DIAGNOSIS — R112 Nausea with vomiting, unspecified: Secondary | ICD-10-CM

## 2019-05-08 DIAGNOSIS — D509 Iron deficiency anemia, unspecified: Secondary | ICD-10-CM

## 2019-05-08 DIAGNOSIS — R5383 Other fatigue: Secondary | ICD-10-CM

## 2019-05-08 DIAGNOSIS — Z411 Encounter for cosmetic surgery: Secondary | ICD-10-CM

## 2019-05-08 DIAGNOSIS — F5102 Adjustment insomnia: Secondary | ICD-10-CM

## 2019-05-08 MED ORDER — LEVOTHYROXINE SODIUM 112 MCG PO TABS: 112.0000 ug | ORAL_TABLET | Freq: Every day | ORAL | 0 refills | Status: DC

## 2019-05-08 MED ORDER — ONDANSETRON HCL 4 MG PO TABS: 4.0000 mg | ORAL_TABLET | Freq: Three times a day (TID) | ORAL | 1 refills | Status: DC | PRN

## 2019-05-08 MED ORDER — ALPRAZOLAM 1 MG PO TABS: 1.0000 mg | ORAL_TABLET | Freq: Every evening | ORAL | 1 refills | Status: DC | PRN

## 2019-05-08 NOTE — Progress Notes (Signed)
Patient treated with Botox Cosmetic/ OnabotulinumtoxinA  Risks and Benefits explained to patient, Written consent obtained and signed.   Lot#: [id number]  Exp: 11/2021  Glabellar: 6 Units Frontalis: 10 units Crows Feet: 24Units Other: 0 Units  Total: 30 Units  Aftercare instructions given to patient.  Patient denied any questions.  Will follow up in 14 days for results review.

## 2019-05-08 NOTE — Progress Notes (Signed)
Refilled prescriptions for zofran PRN, levothyroxine, and alprazolam 1mg  at bedtime. Sent new prescriptions to [facility] in [place]. Patient to follow up in 1 month to 6 weeks.

## 2019-05-09 ENCOUNTER — Ambulatory Visit: Payer: Managed Care, Other (non HMO) | Admitting: Nurse Practitioner

## 2019-05-17 ENCOUNTER — Other Ambulatory Visit: Payer: Self-pay

## 2019-05-17 DIAGNOSIS — E039 Hypothyroidism, unspecified: Secondary | ICD-10-CM

## 2019-05-22 ENCOUNTER — Other Ambulatory Visit: Payer: Self-pay

## 2019-05-22 DIAGNOSIS — E039 Hypothyroidism, unspecified: Secondary | ICD-10-CM

## 2019-05-22 MED ORDER — SYNTHROID 112 MCG PO TABS: 112.0000 ug | ORAL_TABLET | Freq: Every day | ORAL | 0 refills | Status: DC

## 2019-06-05 ENCOUNTER — Other Ambulatory Visit: Payer: Self-pay | Admitting: Nurse Practitioner

## 2019-06-06 LAB — BASIC METABOLIC PANEL
BUN/Creatinine Ratio: 26 — ABNORMAL HIGH (ref 9–23)
BUN: 19 mg/dL (ref 6–24)
CO2: 24 mmol/L (ref 20–29)
Calcium: 10.1 mg/dL (ref 8.7–10.2)
Chloride: 103 mmol/L (ref 96–106)
Creatinine, Ser: 0.72 mg/dL (ref 0.57–1.00)
GFR calc Af Amer: 111 mL/min/{1.73_m2} (ref >59)
GFR calc non Af Amer: 96 mL/min/{1.73_m2} (ref >59)
Glucose: 80 mg/dL (ref 65–99)
Potassium: 4.5 mmol/L (ref 3.5–5.2)
Sodium: 140 mmol/L (ref 134–144)

## 2019-06-06 LAB — CBC
Hematocrit: 40.2 % (ref 34.0–46.6)
Hemoglobin: 13.4 g/dL (ref 11.1–15.9)
MCH: 31.2 pg (ref 26.6–33.0)
MCHC: 33.3 g/dL (ref 31.5–35.7)
MCV: 94 fL (ref 79–97)
Platelets: 369 10*3/uL (ref 150–450)
RBC: 4.29 x10E6/uL (ref 3.77–5.28)
RDW: 12.4 % (ref 11.7–15.4)
WBC: 4.7 10*3/uL (ref 3.4–10.8)

## 2019-06-06 LAB — FERRITIN: Ferritin: 51 ng/mL (ref 15–150)

## 2019-06-06 LAB — IRON AND TIBC
Iron Saturation: 26 % (ref 15–55)
Iron: 92 ug/dL (ref 27–159)
Total Iron Binding Capacity: 360 ug/dL (ref 250–450)
UIBC: 268 ug/dL (ref 131–425)

## 2019-06-06 LAB — VITAMIN D 25 HYDROXY (VIT D DEFICIENCY, FRACTURES): Vit D, 25-Hydroxy: 24.9 ng/mL — ABNORMAL LOW (ref 30.0–100.0)

## 2019-06-06 LAB — B12 AND FOLATE PANEL
Folate: 11.7 ng/mL (ref >3.0)
Vitamin B-12: 707 pg/mL (ref 232–1245)

## 2019-06-06 LAB — T4, FREE: Free T4: 1.5 ng/dL (ref 0.82–1.77)

## 2019-06-06 LAB — TSH: TSH: 1.19 u[IU]/mL (ref 0.450–4.500)

## 2019-06-14 ENCOUNTER — Telehealth: Payer: Self-pay | Admitting: Nurse Practitioner

## 2019-06-14 ENCOUNTER — Other Ambulatory Visit: Payer: Self-pay | Admitting: Nurse Practitioner

## 2019-06-14 DIAGNOSIS — E559 Vitamin D deficiency, unspecified: Secondary | ICD-10-CM

## 2019-06-14 MED ORDER — ERGOCALCIFEROL 1.25 MG (50000 UT) PO CAPS: 50000.0000 [IU] | ORAL_CAPSULE | ORAL | 5 refills | Status: DC

## 2019-06-14 NOTE — Progress Notes (Signed)
Start drisdol 50000 iu weekly for vitamin d deficiency. All other labs were good.

## 2019-06-14 NOTE — Telephone Encounter (Signed)
Pt notified and has upcoming follow up

## 2019-06-14 NOTE — Progress Notes (Signed)
Drisdol for vitamin d deficiency. All other labs were good.

## 2019-06-14 NOTE — Telephone Encounter (Signed)
Just did. Only problem I see is the mildly low vitamin id. I sent drisdol 50000 iu weekly for vitamin d deficiency. All other labs were good.

## 2019-07-03 ENCOUNTER — Encounter: Payer: Self-pay | Admitting: Nurse Practitioner

## 2019-07-03 ENCOUNTER — Other Ambulatory Visit: Payer: Self-pay

## 2019-07-03 ENCOUNTER — Ambulatory Visit: Payer: Managed Care, Other (non HMO) | Admitting: Nurse Practitioner

## 2019-07-03 VITALS — BP 118/86 | HR 75 | Temp 97.7°F | Resp 16 | Ht 63.0 in | Wt 134.4 lb

## 2019-07-03 DIAGNOSIS — F5102 Adjustment insomnia: Secondary | ICD-10-CM

## 2019-07-03 DIAGNOSIS — R112 Nausea with vomiting, unspecified: Secondary | ICD-10-CM

## 2019-07-03 DIAGNOSIS — D485 Neoplasm of uncertain behavior of skin: Secondary | ICD-10-CM

## 2019-07-03 DIAGNOSIS — E039 Hypothyroidism, unspecified: Secondary | ICD-10-CM

## 2019-07-03 DIAGNOSIS — Z79899 Other long term (current) drug therapy: Secondary | ICD-10-CM

## 2019-07-03 LAB — POCT URINE DRUG SCREEN
POC Amphetamine UR: NOT DETECTED
POC BENZODIAZEPINES UR: POSITIVE — AB
POC Barbiturate UR: NOT DETECTED
POC Cocaine UR: NOT DETECTED
POC Ecstasy UR: NOT DETECTED
POC Marijuana UR: NOT DETECTED
POC Methadone UR: NOT DETECTED
POC Methamphetamine UR: NOT DETECTED
POC Opiate Ur: NOT DETECTED
POC Oxycodone UR: NOT DETECTED
POC PHENCYCLIDINE UR: NOT DETECTED
POC TRICYCLICS UR: NOT DETECTED

## 2019-07-03 MED ORDER — ALPRAZOLAM 1 MG PO TABS: 1.0000 mg | ORAL_TABLET | Freq: Every evening | ORAL | 2 refills | Status: DC | PRN

## 2019-07-03 MED ORDER — ONDANSETRON HCL 4 MG PO TABS: 4.0000 mg | ORAL_TABLET | Freq: Three times a day (TID) | ORAL | 2 refills | Status: DC | PRN

## 2019-07-03 NOTE — Progress Notes (Signed)
[facility] [place], [place] [address]  Internal MEDICINE  Office Visit Note  Patient Name: Yesenia Ward  [id number]  [id number]  Date of Service: 07/17/2019  Chief Complaint  Patient presents with  . Hypothyroidism    The patient is here for routine follow up. She is concerned about lesion on the left forearm. Getting larger and coarse in texture. She has seen dermatologist for this in the past. Has been quite some time. Would like to see another one as the lesion is getting larger. Recent thyroid panel was within normal limits. She does continue to use alprazolam at night as needed to reduce anxiety and help her sleep well. She needs to have this refilled today.       Current Medication: Outpatient Encounter Medications as of 07/03/2019  Medication Sig  . acetaminophen (TYLENOL) 500 MG tablet Take 500 mg by mouth every 6 (six) hours as needed.  . ALPRAZolam (XANAX) 1 MG tablet Take 1 tablet (1 mg total) by mouth at bedtime as needed for anxiety.  . ergocalciferol (DRISDOL) 1.25 MG (50000 UT) capsule Take 1 capsule (50,000 Units total) by mouth once a week.  . Estradiol-Progesterone (BIJUVA) 1-100 MG CAPS Take by mouth.  . Fexofenadine-Pseudoephedrine (ALLEGRA-D 12 HOUR PO) Take by mouth.  . imatinib (GLEEVEC) 100 MG tablet Take 100 mg by mouth 2 (two) times daily. Take with meals and large glass of water.Caution:Chemotherapy  . ondansetron (ZOFRAN) 4 MG tablet Take 1 tablet (4 mg total) by mouth every 8 (eight) hours as needed for nausea or vomiting.  Marland Kitchen SYNTHROID 112 MCG tablet Take 1 tablet (112 mcg total) by mouth daily before breakfast.  . tiZANidine (ZANAFLEX) 2 MG tablet Take 2 mg by mouth at bedtime.  . [DISCONTINUED] ALPRAZolam (XANAX) 1 MG tablet Take 1 tablet (1 mg total) by mouth at bedtime as needed for anxiety.  . [DISCONTINUED] ondansetron (ZOFRAN) 4 MG tablet Take 1 tablet (4 mg total) by mouth every 8 (eight) hours as needed for nausea or  vomiting.  . [DISCONTINUED] cephALEXin (KEFLEX) 500 MG capsule Take 1 capsule (500 mg total) by mouth 3 (three) times daily. (Patient not taking: Reported on 07/03/2019)  . [DISCONTINUED] doxylamine, Sleep, (UNISOM) 25 MG tablet Take 25 mg by mouth at bedtime as needed.  . [DISCONTINUED] fluconazole (DIFLUCAN) 150 MG tablet Take 1 tablet (150 mg total) by mouth daily. (Patient not taking: Reported on 07/03/2019)  . [DISCONTINUED] Valerian Root 500 MG CAPS Take by mouth.   No facility-administered encounter medications on file as of 07/03/2019.     Surgical History: Past Surgical History:  Procedure Laterality Date  . gist tumor remooval    . OVARIAN CYST REMOVAL Left   . TUBAL LIGATION      Medical History: Past Medical History:  Diagnosis Date  . GIST (gastrointestinal stromal tumor), non-malignant   . Hypothyroid   . Tumor     Family History: Family History  Problem Relation Age of Onset  . Diabetes Mother   . Coronary artery disease Mother   . Rectal cancer Paternal Aunt   . Cancer Paternal Aunt   . Cirrhosis Father   . Cancer Maternal Uncle     Social History   Socioeconomic History  . Marital status: Married    Spouse name: Not on file  . Number of children: Not on file  . Years of education: Not on file  . Highest education level: Not on file  Occupational History  . Not  on file  Social Needs  . Financial resource strain: Not on file  . Food insecurity    Worry: Not on file    Inability: Not on file  . Transportation needs    Medical: Not on file    Non-medical: Not on file  Tobacco Use  . Smoking status: Former Research scientist (life sciences)  . Smokeless tobacco: Never Used  Substance and Sexual Activity  . Alcohol use: Yes    Comment: occasionally  . Drug use: Never  . Sexual activity: Not on file  Lifestyle  . Physical activity    Days per week: Not on file    Minutes per session: Not on file  . Stress: Not on file  Relationships  . Social Herbalist on  phone: Not on file    Gets together: Not on file    Attends religious service: Not on file    Active member of club or organization: Not on file    Attends meetings of clubs or organizations: Not on file    Relationship status: Not on file  . Intimate partner violence    Fear of current or ex partner: Not on file    Emotionally abused: Not on file    Physically abused: Not on file    Forced sexual activity: Not on file  Other Topics Concern  . Not on file  Social History Narrative  . Not on file      Review of Systems  Constitutional: Negative for activity change, chills, fatigue and unexpected weight change.  HENT: Negative for congestion, postnasal drip, rhinorrhea, sneezing and sore throat.   Respiratory: Negative for cough, chest tightness, shortness of breath and wheezing.   Cardiovascular: Negative for chest pain and palpitations.  Gastrointestinal: Positive for nausea. Negative for abdominal pain, constipation, diarrhea and vomiting.  Endocrine: Negative for cold intolerance, heat intolerance, polydipsia and polyuria.       Chronic hypothyroid which has been well-managed.   Musculoskeletal: Negative for arthralgias, back pain, joint swelling and neck pain.  Skin: Negative for rash.       Skin lesion on left forearm which she feels is getting bigger. Does not hurt or itch. Is not irritating.   Allergic/Immunologic: Positive for environmental allergies.  Neurological: Negative for dizziness, tremors, numbness and headaches.  Hematological: Negative for adenopathy. Does not bruise/bleed easily.  Psychiatric/Behavioral: Positive for sleep disturbance. Negative for behavioral problems (Depression) and suicidal ideas. The patient is nervous/anxious.     Today's Vitals   07/03/19 1151  BP: 118/86  Pulse: 75  Resp: 16  Temp: 97.7 F (36.5 C)  SpO2: 98%  Weight: 134 lb 6.4 oz (61 kg)  Height: 5\' 3"  (1.6 m)   Body mass index is 23.81 kg/m.  Physical Exam Vitals signs  and nursing note reviewed.  Constitutional:      General: She is not in acute distress.    Appearance: Normal appearance. She is well-developed. She is not diaphoretic.  HENT:     Head: Normocephalic and atraumatic.     Nose: Nose normal.     Mouth/Throat:     Pharynx: No oropharyngeal exudate.  Eyes:     Conjunctiva/sclera: Conjunctivae normal.     Pupils: Pupils are equal, round, and reactive to light.  Neck:     Musculoskeletal: Normal range of motion and neck supple.     Thyroid: No thyromegaly.     Vascular: No JVD.     Trachea: No tracheal deviation.  Cardiovascular:  Rate and Rhythm: Normal rate and regular rhythm.     Heart sounds: Normal heart sounds. No murmur. No friction rub. No gallop.   Pulmonary:     Effort: Pulmonary effort is normal. No respiratory distress.     Breath sounds: Normal breath sounds. No wheezing or rales.  Chest:     Chest wall: No tenderness.  Abdominal:     Palpations: Abdomen is soft.  Musculoskeletal: Normal range of motion.  Lymphadenopathy:     Cervical: No cervical adenopathy.  Skin:    General: Skin is warm and dry.       Neurological:     Mental Status: She is alert and oriented to person, place, and time.     Cranial Nerves: No cranial nerve deficit.  Psychiatric:        Mood and Affect: Mood normal.        Behavior: Behavior normal.        Thought Content: Thought content normal.        Judgment: Judgment normal.    Assessment/Plan: 1. Acquired hypothyroidism Stable. Continue levothyroxine as prescribed   2. Neoplasm of uncertain behavior of skin of forearm Refer to dermatology for further evaluation.  - Ambulatory referral to Dermatology  3. Adjustment insomnia May continue alprazolam 1mg  at bedtime as needed for insomnia/anxiety. New prescription sent to her pharmacy.  - ALPRAZolam Duanne Moron) 1 MG tablet; Take 1 tablet (1 mg total) by mouth at bedtime as needed for anxiety.  Dispense: 30 tablet; Refill: 2  4. Nausea  and vomiting, intractability of vomiting not specified, unspecified vomiting type zofran may be taken as needed and as prescribed  - ondansetron (ZOFRAN) 4 MG tablet; Take 1 tablet (4 mg total) by mouth every 8 (eight) hours as needed for nausea or vomiting.  Dispense: 30 tablet; Refill: 2  5. Encounter for long-term (current) use of high-risk medication - POCT Urine Drug Screen appropriately positive for BZO  General Counseling: Onell verbalizes understanding of the findings of todays visit and agrees with plan of treatment. I have discussed any further diagnostic evaluation that may be needed or ordered today. We also reviewed her medications today. she has been encouraged to call the office with any questions or concerns that should arise related to todays visit.  Reviewed risks and possible side effects associated with taking opiates, benzodiazepines and other CNS depressants. Combination of these could cause dizziness and drowsiness. Advised patient not to drive or operate machinery when taking these medications, as patient's and other's life can be at risk and will have consequences. Patient verbalized understanding in this matter. Dependence and abuse for these drugs will be monitored closely. A Controlled substance policy and procedure is on file which allows [facility] to order a urine drug screen test at any visit. Patient understands and agrees with the plan  This patient was seen by Leretha Pol FNP Collaboration with Dr Lavera Guise as a part of collaborative care agreement  Orders Placed This Encounter  Procedures  . Ambulatory referral to Dermatology  . POCT Urine Drug Screen    Meds ordered this encounter  Medications  . ALPRAZolam (XANAX) 1 MG tablet    Sig: Take 1 tablet (1 mg total) by mouth at bedtime as needed for anxiety.    Dispense:  30 tablet    Refill:  2    Order Specific Question:   Supervising Provider    Answer:   Lavera Guise [id number]  .  ondansetron (ZOFRAN)  4 MG tablet    Sig: Take 1 tablet (4 mg total) by mouth every 8 (eight) hours as needed for nausea or vomiting.    Dispense:  30 tablet    Refill:  2    Order Specific Question:   Supervising Provider    Answer:   Lavera Guise [id number]    Time spent: 51 Minutes      Dr Lavera Guise Internal medicine

## 2019-07-10 ENCOUNTER — Other Ambulatory Visit: Payer: Self-pay | Admitting: Obstetrics and Gynecology

## 2019-07-10 DIAGNOSIS — Z1231 Encounter for screening mammogram for malignant neoplasm of breast: Secondary | ICD-10-CM

## 2019-07-15 ENCOUNTER — Telehealth: Payer: Self-pay

## 2019-07-15 NOTE — Telephone Encounter (Signed)
Called lmom asking patient to call to let us know where she would like to go for  Dermatology referral. Jerrell Mylar

## 2019-07-17 DIAGNOSIS — D485 Neoplasm of uncertain behavior of skin: Secondary | ICD-10-CM | POA: Insufficient documentation

## 2019-07-17 DIAGNOSIS — Z79899 Other long term (current) drug therapy: Secondary | ICD-10-CM | POA: Insufficient documentation

## 2019-08-26 ENCOUNTER — Other Ambulatory Visit: Payer: Self-pay

## 2019-08-26 DIAGNOSIS — E039 Hypothyroidism, unspecified: Secondary | ICD-10-CM

## 2019-08-26 MED ORDER — SYNTHROID 112 MCG PO TABS: 112.0000 ug | ORAL_TABLET | Freq: Every day | ORAL | 1 refills | Status: DC

## 2019-10-30 ENCOUNTER — Telehealth: Payer: Self-pay

## 2019-10-30 NOTE — Telephone Encounter (Signed)
LMOM TO CONFIRM AND SCREEN FOR 11-01-19 OV.

## 2019-11-01 ENCOUNTER — Other Ambulatory Visit: Payer: Self-pay

## 2019-11-01 ENCOUNTER — Encounter: Payer: Self-pay | Admitting: Nurse Practitioner

## 2019-11-01 ENCOUNTER — Ambulatory Visit: Payer: Self-pay | Admitting: Nurse Practitioner

## 2019-11-01 VITALS — BP 124/74 | HR 81 | Temp 97.4°F | Resp 16 | Ht 63.0 in | Wt 132.0 lb

## 2019-11-01 DIAGNOSIS — K219 Gastro-esophageal reflux disease without esophagitis: Secondary | ICD-10-CM

## 2019-11-01 DIAGNOSIS — F5102 Adjustment insomnia: Secondary | ICD-10-CM

## 2019-11-01 DIAGNOSIS — Z9189 Other specified personal risk factors, not elsewhere classified: Secondary | ICD-10-CM

## 2019-11-01 DIAGNOSIS — E039 Hypothyroidism, unspecified: Secondary | ICD-10-CM

## 2019-11-01 DIAGNOSIS — Z79899 Other long term (current) drug therapy: Secondary | ICD-10-CM

## 2019-11-01 LAB — POCT URINE DRUG SCREEN
POC Amphetamine UR: NOT DETECTED
POC BENZODIAZEPINES UR: NOT DETECTED
POC Barbiturate UR: NOT DETECTED
POC Cocaine UR: NOT DETECTED
POC Ecstasy UR: NOT DETECTED
POC Marijuana UR: NOT DETECTED
POC Methadone UR: NOT DETECTED
POC Methamphetamine UR: NOT DETECTED
POC Opiate Ur: NOT DETECTED
POC Oxycodone UR: NOT DETECTED
POC PHENCYCLIDINE UR: NOT DETECTED
POC TRICYCLICS UR: NOT DETECTED

## 2019-11-01 MED ORDER — ALPRAZOLAM 1 MG PO TABS: 1.0000 mg | ORAL_TABLET | Freq: Every evening | ORAL | 2 refills | Status: DC | PRN

## 2019-11-01 MED ORDER — ESOMEPRAZOLE MAGNESIUM 40 MG PO PACK: 40.0000 mg | PACK | Freq: Every day | ORAL | 12 refills | Status: DC

## 2019-11-01 NOTE — Progress Notes (Signed)
[facility] [place], [place] [address]  Internal MEDICINE  Office Visit Note  Patient Name: Yesenia Ward  [id number]  [id number]  Date of Service: 11/04/2019  Chief Complaint  Patient presents with  . Hypothyroidism  . Medication Refill    The patient is here for follow up. She states that she is having some trouble with GERD. Did help with intermittent fasting. After trying multiple medications to help with this, Nexium finally worked, but had to take 40mg . States that this is very expensive. Would like to try prescription for this. Has had UGI and endoscopy in the past due to same symptoms and all were normal. She does continue to use alprazolam at night as needed to reduce anxiety and help her sleep well. She needs to have this refilled today.        Current Medication: Outpatient Encounter Medications as of 11/01/2019  Medication Sig  . acetaminophen (TYLENOL) 500 MG tablet Take 500 mg by mouth every 6 (six) hours as needed.  . ALPRAZolam (XANAX) 1 MG tablet Take 1 tablet (1 mg total) by mouth at bedtime as needed for anxiety.  . ergocalciferol (DRISDOL) 1.25 MG (50000 UT) capsule Take 1 capsule (50,000 Units total) by mouth once a week.  . Estradiol-Progesterone (BIJUVA) 1-100 MG CAPS Take by mouth.  . Fexofenadine-Pseudoephedrine (ALLEGRA-D 12 HOUR PO) Take by mouth.  . imatinib (GLEEVEC) 100 MG tablet Take 100 mg by mouth 2 (two) times daily. Take with meals and large glass of water.Caution:Chemotherapy  . ondansetron (ZOFRAN) 4 MG tablet Take 1 tablet (4 mg total) by mouth every 8 (eight) hours as needed for nausea or vomiting.  Marland Kitchen SYNTHROID 112 MCG tablet Take 1 tablet (112 mcg total) by mouth daily before breakfast.  . [DISCONTINUED] ALPRAZolam (XANAX) 1 MG tablet Take 1 tablet (1 mg total) by mouth at bedtime as needed for anxiety.  Marland Kitchen esomeprazole (NEXIUM) 40 MG packet Take 40 mg by mouth daily before breakfast.  . tiZANidine (ZANAFLEX) 2 MG tablet  Take 2 mg by mouth at bedtime.   No facility-administered encounter medications on file as of 11/01/2019.    Surgical History: Past Surgical History:  Procedure Laterality Date  . gist tumor remooval    . OVARIAN CYST REMOVAL Left   . TUBAL LIGATION      Medical History: Past Medical History:  Diagnosis Date  . GIST (gastrointestinal stromal tumor), non-malignant   . Hypothyroid   . Tumor     Family History: Family History  Problem Relation Age of Onset  . Diabetes Mother   . Coronary artery disease Mother   . Rectal cancer Paternal Aunt   . Cancer Paternal Aunt   . Cirrhosis Father   . Cancer Maternal Uncle     Social History   Socioeconomic History  . Marital status: Married    Spouse name: Not on file  . Number of children: Not on file  . Years of education: Not on file  . Highest education level: Not on file  Occupational History  . Not on file  Tobacco Use  . Smoking status: Former Research scientist (life sciences)  . Smokeless tobacco: Never Used  Substance and Sexual Activity  . Alcohol use: Yes    Comment: occasionally  . Drug use: Never  . Sexual activity: Not on file  Other Topics Concern  . Not on file  Social History Narrative  . Not on file   Social Determinants of Health   Financial Resource Strain:   .  Difficulty of Paying Living Expenses: Not on file  Food Insecurity:   . Worried About Charity fundraiser in the Last Year: Not on file  . Ran Out of Food in the Last Year: Not on file  Transportation Needs:   . Lack of Transportation (Medical): Not on file  . Lack of Transportation (Non-Medical): Not on file  Physical Activity:   . Days of Exercise per Week: Not on file  . Minutes of Exercise per Session: Not on file  Stress:   . Feeling of Stress : Not on file  Social Connections:   . Frequency of Communication with Friends and Family: Not on file  . Frequency of Social Gatherings with Friends and Family: Not on file  . Attends Religious Services: Not on  file  . Active Member of Clubs or Organizations: Not on file  . Attends Archivist Meetings: Not on file  . Marital Status: Not on file  Intimate Partner Violence:   . Fear of Current or Ex-Partner: Not on file  . Emotionally Abused: Not on file  . Physically Abused: Not on file  . Sexually Abused: Not on file      Review of Systems  Constitutional: Negative for activity change, chills, fatigue and unexpected weight change.  HENT: Negative for congestion, postnasal drip, rhinorrhea, sneezing and sore throat.   Respiratory: Negative for cough, chest tightness, shortness of breath and wheezing.   Cardiovascular: Negative for chest pain and palpitations.  Gastrointestinal: Positive for nausea. Negative for abdominal pain, constipation, diarrhea and vomiting.       Acid reflux.   Endocrine: Negative for cold intolerance, heat intolerance, polydipsia and polyuria.       Chronic hypothyroid which has been well-managed.   Musculoskeletal: Negative for arthralgias, back pain, joint swelling and neck pain.  Skin: Negative for rash.       Skin lesion on left forearm which she feels is getting bigger. Does not hurt or itch. Is not irritating. Did have referral to dermatology but has not been able to go yet.   Allergic/Immunologic: Positive for environmental allergies.  Neurological: Negative for dizziness, tremors, numbness and headaches.  Hematological: Negative for adenopathy. Does not bruise/bleed easily.  Psychiatric/Behavioral: Positive for sleep disturbance. Negative for behavioral problems (Depression) and suicidal ideas. The patient is nervous/anxious.     Today's Vitals   11/01/19 1141  BP: 124/74  Pulse: 81  Resp: 16  Temp: (!) 97.4 F (36.3 C)  SpO2: 98%  Weight: 132 lb (59.9 kg)  Height: 5\' 3"  (1.6 m)   Body mass index is 23.38 kg/m.  Physical Exam Vitals and nursing note reviewed.  Constitutional:      General: She is not in acute distress.    Appearance:  Normal appearance. She is well-developed. She is not diaphoretic.  HENT:     Head: Normocephalic and atraumatic.     Nose: Nose normal.     Mouth/Throat:     Pharynx: No oropharyngeal exudate.  Eyes:     Pupils: Pupils are equal, round, and reactive to light.  Neck:     Thyroid: No thyromegaly.     Vascular: No carotid bruit or JVD.     Trachea: No tracheal deviation.  Cardiovascular:     Rate and Rhythm: Normal rate and regular rhythm.     Heart sounds: Normal heart sounds. No murmur. No friction rub. No gallop.   Pulmonary:     Effort: Pulmonary effort is normal. No respiratory distress.  Breath sounds: Normal breath sounds. No wheezing or rales.  Chest:     Chest wall: No tenderness.  Abdominal:     General: Bowel sounds are normal.     Palpations: Abdomen is soft.     Tenderness: There is no abdominal tenderness.  Musculoskeletal:        General: Normal range of motion.     Cervical back: Normal range of motion and neck supple.  Lymphadenopathy:     Cervical: No cervical adenopathy.  Skin:    General: Skin is warm and dry.  Neurological:     Mental Status: She is alert and oriented to person, place, and time.     Cranial Nerves: No cranial nerve deficit.  Psychiatric:        Behavior: Behavior normal.        Thought Content: Thought content normal.        Judgment: Judgment normal.    Assessment/Plan: 1. Gastroesophageal reflux disease without esophagitis Add rx for nexium 40mg  daily. Recommend she avoid trigger foods and sleep with head of bead elevated to 30 degrees at night.  - esomeprazole (NEXIUM) 40 MG packet; Take 40 mg by mouth daily before breakfast.  Dispense: 30 each; Refill: 12  2. Acquired hypothyroidism Check thyroid panel and adjust levothyroxien dose as indicated.   3. Adjustment insomnia May take alprazolam 1mg  at bedtime as needed for anxiety/insomnia.  - ALPRAZolam (XANAX) 1 MG tablet; Take 1 tablet (1 mg total) by mouth at bedtime as needed  for anxiety.  Dispense: 30 tablet; Refill: 2  4. At increased risk of exposure to COVID-19 virus Check COVID 19 antibody levels.   5. Encounter for long-term (current) use of medications - POCT Urine Drug Screen negative for controlled substances today.   General Counseling: lonnetta haen understanding of the findings of todays visit and agrees with plan of treatment. I have discussed any further diagnostic evaluation that may be needed or ordered today. We also reviewed her medications today. she has been encouraged to call the office with any questions or concerns that should arise related to todays visit.  This patient was seen by Leretha Pol FNP Collaboration with Dr Lavera Guise as a part of collaborative care agreement  Orders Placed This Encounter  Procedures  . POCT Urine Drug Screen    Meds ordered this encounter  Medications  . esomeprazole (NEXIUM) 40 MG packet    Sig: Take 40 mg by mouth daily before breakfast.    Dispense:  30 each    Refill:  12    Order Specific Question:   Supervising Provider    Answer:   Lavera Guise [place]  . ALPRAZolam (XANAX) 1 MG tablet    Sig: Take 1 tablet (1 mg total) by mouth at bedtime as needed for anxiety.    Dispense:  30 tablet    Refill:  2    Order Specific Question:   Supervising Provider    Answer:   Lavera Guise [id number]    Total time spent: 30 Minutes   Time spent includes review of chart, medications, test results, and follow up plan with the patient.      Dr Lavera Guise Internal medicine

## 2019-11-04 ENCOUNTER — Telehealth: Payer: Self-pay

## 2019-11-04 DIAGNOSIS — Z9189 Other specified personal risk factors, not elsewhere classified: Secondary | ICD-10-CM | POA: Insufficient documentation

## 2019-11-04 DIAGNOSIS — K219 Gastro-esophageal reflux disease without esophagitis: Secondary | ICD-10-CM | POA: Insufficient documentation

## 2019-11-04 NOTE — Telephone Encounter (Signed)
PRIOR AUTHORIZATION FOR ESOMEPRAZOLE 40MG  PACKET HAS BEEN APPROVED.Marland KitchenVALID UNTIL 11/03/20.TAT

## 2019-11-13 ENCOUNTER — Other Ambulatory Visit: Payer: Self-pay | Admitting: Nurse Practitioner

## 2019-11-14 LAB — CBC
Hematocrit: 39.9 % (ref 34.0–46.6)
Hemoglobin: 13.6 g/dL (ref 11.1–15.9)
MCH: 32.1 pg (ref 26.6–33.0)
MCHC: 34.1 g/dL (ref 31.5–35.7)
MCV: 94 fL (ref 79–97)
Platelets: 306 10*3/uL (ref 150–450)
RBC: 4.24 x10E6/uL (ref 3.77–5.28)
RDW: 12.2 % (ref 11.7–15.4)
WBC: 5.6 10*3/uL (ref 3.4–10.8)

## 2019-11-14 LAB — TSH: TSH: 1.47 u[IU]/mL (ref 0.450–4.500)

## 2019-11-14 LAB — VITAMIN D 25 HYDROXY (VIT D DEFICIENCY, FRACTURES): Vit D, 25-Hydroxy: 37.8 ng/mL (ref 30.0–100.0)

## 2019-11-14 LAB — T4, FREE: Free T4: 1.25 ng/dL (ref 0.82–1.77)

## 2019-11-14 LAB — SAR COV2 SEROLOGY (COVID19)AB(IGG),IA: DiaSorin SARS-CoV-2 Ab, IgG: NEGATIVE

## 2019-11-21 NOTE — Progress Notes (Signed)
Labs good. Negative for COVID antibodies.

## 2019-11-25 ENCOUNTER — Other Ambulatory Visit: Payer: Self-pay

## 2019-11-25 DIAGNOSIS — E559 Vitamin D deficiency, unspecified: Secondary | ICD-10-CM

## 2019-11-25 MED ORDER — ERGOCALCIFEROL 1.25 MG (50000 UT) PO CAPS: 50000.0000 [IU] | ORAL_CAPSULE | ORAL | 5 refills | Status: DC

## 2019-12-11 ENCOUNTER — Other Ambulatory Visit: Payer: Self-pay | Admitting: Obstetrics and Gynecology

## 2019-12-11 ENCOUNTER — Other Ambulatory Visit: Payer: Self-pay

## 2019-12-11 ENCOUNTER — Ambulatory Visit
Admission: RE | Admit: 2019-12-11 | Discharge: 2019-12-11 | Disposition: A | Discharge status: Home / Self Care | Payer: 59 | Source: Ambulatory Visit | Attending: Obstetrics and Gynecology | Admitting: Obstetrics and Gynecology

## 2019-12-11 DIAGNOSIS — Z1231 Encounter for screening mammogram for malignant neoplasm of breast: Secondary | ICD-10-CM | POA: Diagnosis not present

## 2019-12-11 DIAGNOSIS — R112 Nausea with vomiting, unspecified: Secondary | ICD-10-CM

## 2019-12-11 MED ORDER — ONDANSETRON HCL 4 MG PO TABS: 4.0000 mg | ORAL_TABLET | Freq: Three times a day (TID) | ORAL | 2 refills | Status: DC | PRN

## 2020-02-28 ENCOUNTER — Ambulatory Visit: Payer: Managed Care, Other (non HMO) | Admitting: Nurse Practitioner

## 2020-03-03 ENCOUNTER — Other Ambulatory Visit: Payer: Self-pay

## 2020-03-03 DIAGNOSIS — E039 Hypothyroidism, unspecified: Secondary | ICD-10-CM

## 2020-03-03 MED ORDER — SYNTHROID 112 MCG PO TABS: 112.0000 ug | ORAL_TABLET | Freq: Every day | ORAL | 1 refills | Status: DC

## 2020-03-06 ENCOUNTER — Telehealth: Payer: Self-pay

## 2020-03-06 NOTE — Telephone Encounter (Signed)
Confirmed and screened for 03-10-20 ov.

## 2020-03-06 NOTE — Telephone Encounter (Signed)
Error

## 2020-03-10 ENCOUNTER — Ambulatory Visit: Payer: 59 | Admitting: Nurse Practitioner

## 2020-03-10 ENCOUNTER — Encounter: Payer: Self-pay | Admitting: Nurse Practitioner

## 2020-03-10 ENCOUNTER — Other Ambulatory Visit: Payer: Self-pay

## 2020-03-10 VITALS — BP 131/89 | HR 75 | Temp 97.5°F | Resp 16 | Ht 63.0 in | Wt 132.0 lb

## 2020-03-10 DIAGNOSIS — K219 Gastro-esophageal reflux disease without esophagitis: Secondary | ICD-10-CM

## 2020-03-10 DIAGNOSIS — D509 Iron deficiency anemia, unspecified: Secondary | ICD-10-CM

## 2020-03-10 DIAGNOSIS — E039 Hypothyroidism, unspecified: Secondary | ICD-10-CM | POA: Diagnosis not present

## 2020-03-10 DIAGNOSIS — E559 Vitamin D deficiency, unspecified: Secondary | ICD-10-CM

## 2020-03-10 DIAGNOSIS — Z79899 Other long term (current) drug therapy: Secondary | ICD-10-CM

## 2020-03-10 DIAGNOSIS — F5102 Adjustment insomnia: Secondary | ICD-10-CM | POA: Diagnosis not present

## 2020-03-10 LAB — POCT URINE DRUG SCREEN
POC Amphetamine UR: NOT DETECTED
POC BENZODIAZEPINES UR: POSITIVE — AB
POC Barbiturate UR: NOT DETECTED
POC Cocaine UR: NOT DETECTED
POC Ecstasy UR: NOT DETECTED
POC Marijuana UR: NOT DETECTED
POC Methadone UR: NOT DETECTED
POC Methamphetamine UR: NOT DETECTED
POC Opiate Ur: NOT DETECTED
POC Oxycodone UR: NOT DETECTED
POC PHENCYCLIDINE UR: NOT DETECTED
POC TRICYCLICS UR: NOT DETECTED

## 2020-03-10 MED ORDER — ALPRAZOLAM 1 MG PO TABS: 1.0000 mg | ORAL_TABLET | Freq: Every evening | ORAL | 3 refills | Status: DC | PRN

## 2020-03-10 NOTE — Progress Notes (Signed)
[facility] [address] [place], [place] [address]  Internal MEDICINE  Office Visit Note  Patient Name: Yesenia Ward  [id number]  [id number]  Date of Service: 03/18/2020  Chief Complaint  Patient presents with  . Follow-up  . Quality Metric Gaps    covid vaccine    The patient presents for routine follow up visit. Recent thyroid panel was within normal limits. She does continue to use alprazolam at night as needed to reduce anxiety and help her sleep well.  She recently had CT scan for surveillance of GIST tumor. No new or acute findings were present.Will continue to have yearly CT scans done for monitoring. She has no symptoms, such as chest pain, chest pressure, or shortness of breath. She does not smoke.       Current Medication: Outpatient Encounter Medications as of 03/10/2020  Medication Sig  . acetaminophen (TYLENOL) 500 MG tablet Take 500 mg by mouth every 6 (six) hours as needed.  . ALPRAZolam (XANAX) 1 MG tablet Take 1 tablet (1 mg total) by mouth at bedtime as needed for anxiety.  . ergocalciferol (DRISDOL) 1.25 MG (50000 UT) capsule Take 1 capsule (50,000 Units total) by mouth once a week.  . esomeprazole (NEXIUM) 40 MG packet Take 40 mg by mouth daily before breakfast.  . Estradiol-Progesterone (BIJUVA) 1-100 MG CAPS Take by mouth.  . Fexofenadine-Pseudoephedrine (ALLEGRA-D 12 HOUR PO) Take by mouth.  . imatinib (GLEEVEC) 100 MG tablet Take 100 mg by mouth 2 (two) times daily. Take with meals and large glass of water.Caution:Chemotherapy  . ondansetron (ZOFRAN) 4 MG tablet Take 1 tablet (4 mg total) by mouth every 8 (eight) hours as needed for nausea or vomiting.  Marland Kitchen SYNTHROID 112 MCG tablet Take 1 tablet (112 mcg total) by mouth daily before breakfast.  . tiZANidine (ZANAFLEX) 2 MG tablet Take 2 mg by mouth at bedtime.  . [DISCONTINUED] ALPRAZolam (XANAX) 1 MG tablet Take 1 tablet (1 mg total) by mouth at bedtime as needed for anxiety.  . [DISCONTINUED]  ergocalciferol (DRISDOL) 1.25 MG (50000 UT) capsule Take 1 capsule (50,000 Units total) by mouth once a week.  . [DISCONTINUED] esomeprazole (NEXIUM) 40 MG packet Take 40 mg by mouth daily before breakfast.   No facility-administered encounter medications on file as of 03/10/2020.    Surgical History: Past Surgical History:  Procedure Laterality Date  . AUGMENTATION MAMMAPLASTY Bilateral 2005  . gist tumor remooval    . OVARIAN CYST REMOVAL Left   . TUBAL LIGATION      Medical History: Past Medical History:  Diagnosis Date  . GIST (gastrointestinal stromal tumor), non-malignant   . Hypothyroid   . Tumor     Family History: Family History  Problem Relation Age of Onset  . Diabetes Mother   . Coronary artery disease Mother   . Rectal cancer Paternal Aunt   . Cancer Paternal Aunt   . Cirrhosis Father   . Cancer Maternal Uncle     Social History   Socioeconomic History  . Marital status: Married    Spouse name: Not on file  . Number of children: Not on file  . Years of education: Not on file  . Highest education level: Not on file  Occupational History  . Not on file  Tobacco Use  . Smoking status: Former Research scientist (life sciences)  . Smokeless tobacco: Never Used  Vaping Use  . Vaping Use: Never used  Substance and Sexual Activity  . Alcohol use: Yes    Comment:  occasionally  . Drug use: Never  . Sexual activity: Not on file  Other Topics Concern  . Not on file  Social History Narrative  . Not on file   Social Determinants of Health   Financial Resource Strain:   . Difficulty of Paying Living Expenses:   Food Insecurity:   . Worried About Charity fundraiser in the Last Year:   . Arboriculturist in the Last Year:   Transportation Needs:   . Film/video editor (Medical):   Marland Kitchen Lack of Transportation (Non-Medical):   Physical Activity:   . Days of Exercise per Week:   . Minutes of Exercise per Session:   Stress:   . Feeling of Stress :   Social Connections:   .  Frequency of Communication with Friends and Family:   . Frequency of Social Gatherings with Friends and Family:   . Attends Religious Services:   . Active Member of Clubs or Organizations:   . Attends Archivist Meetings:   Marland Kitchen Marital Status:   Intimate Partner Violence:   . Fear of Current or Ex-Partner:   . Emotionally Abused:   Marland Kitchen Physically Abused:   . Sexually Abused:       Review of Systems  Constitutional: Negative for activity change, chills, fatigue and unexpected weight change.  HENT: Negative for congestion, postnasal drip, rhinorrhea, sneezing and sore throat.   Respiratory: Negative for cough, chest tightness, shortness of breath and wheezing.   Cardiovascular: Negative for chest pain and palpitations.  Gastrointestinal: Positive for nausea. Negative for abdominal pain, constipation, diarrhea and vomiting.       Acid reflux.   Endocrine: Negative for cold intolerance, heat intolerance, polydipsia and polyuria.       Chronic hypothyroid which has been well-managed.   Musculoskeletal: Negative for arthralgias, back pain, joint swelling and neck pain.  Skin: Negative for rash.  Allergic/Immunologic: Positive for environmental allergies.  Neurological: Negative for dizziness, tremors, numbness and headaches.  Hematological: Negative for adenopathy. Does not bruise/bleed easily.  Psychiatric/Behavioral: Positive for sleep disturbance. Negative for behavioral problems (Depression) and suicidal ideas. The patient is nervous/anxious.     Today's Vitals   03/10/20 1125  BP: 131/89  Pulse: 75  Resp: 16  Temp: (!) 97.5 F (36.4 C)  SpO2: 98%  Weight: 132 lb (59.9 kg)  Height: 5\' 3"  (1.6 m)   Body mass index is 23.38 kg/m.  Physical Exam Vitals and nursing note reviewed.  Constitutional:      General: She is not in acute distress.    Appearance: Normal appearance. She is well-developed. She is not diaphoretic.  HENT:     Head: Normocephalic and atraumatic.      Nose: Nose normal.     Mouth/Throat:     Pharynx: No oropharyngeal exudate.  Eyes:     Pupils: Pupils are equal, round, and reactive to light.  Neck:     Thyroid: No thyromegaly.     Vascular: No carotid bruit or JVD.     Trachea: No tracheal deviation.  Cardiovascular:     Rate and Rhythm: Normal rate and regular rhythm.     Heart sounds: Normal heart sounds. No murmur heard.  No friction rub. No gallop.   Pulmonary:     Effort: Pulmonary effort is normal. No respiratory distress.     Breath sounds: Normal breath sounds. No wheezing or rales.  Chest:     Chest wall: No tenderness.  Abdominal:  Palpations: Abdomen is soft.  Musculoskeletal:        General: Normal range of motion.     Cervical back: Normal range of motion and neck supple.  Lymphadenopathy:     Cervical: No cervical adenopathy.  Skin:    General: Skin is warm and dry.  Neurological:     Mental Status: She is alert and oriented to person, place, and time.     Cranial Nerves: No cranial nerve deficit.  Psychiatric:        Mood and Affect: Mood normal.        Behavior: Behavior normal.        Thought Content: Thought content normal.        Judgment: Judgment normal.   Assessment/Plan:  1. Acquired hypothyroidism Check thyroid panel and adjust levothyroxine as indicated.  - Thyroid Panel With TSH; Future - Thyroid Panel With TSH  2. Gastroesophageal reflux disease without esophagitis Continue nexium as prescribed - esomeprazole (NEXIUM) 40 MG packet; Take 40 mg by mouth daily before breakfast.  Dispense: 90 each; Refill: 3  3. Iron deficiency anemia, unspecified iron deficiency anemia type Check CBC with diff. Treat as indicated.  - CBC w/Diff/Platelet; Future - CBC w/Diff/Platelet  4. Adjustment insomnia May take alprazolam 1mg  at bedtime as needed for acute anxiety/insomnia as needed. New prescription sent to her pharmacy today.  - ALPRAZolam (XANAX) 1 MG tablet; Take 1 tablet (1 mg total) by  mouth at bedtime as needed for anxiety.  Dispense: 30 tablet; Refill: 3  5. Vitamin D deficiency Drisdol should be taken once week.  - ergocalciferol (DRISDOL) 1.25 MG (50000 UT) capsule; Take 1 capsule (50,000 Units total) by mouth once a week.  Dispense: 12 capsule; Refill: 3  6. Encounter for long-term (current) use of medications - POCT Urine Drug Screen appropriately positive for BZO only.   General Counseling: nakeya adinolfi understanding of the findings of todays visit and agrees with plan of treatment. I have discussed any further diagnostic evaluation that may be needed or ordered today. We also reviewed her medications today. she has been encouraged to call the office with any questions or concerns that should arise related to todays visit.  This patient was seen by Leretha Pol FNP Collaboration with Dr Lavera Guise as a part of collaborative care agreement  Orders Placed This Encounter  Procedures  . CBC w/Diff/Platelet  . Thyroid Panel With TSH  . POCT Urine Drug Screen    Meds ordered this encounter  Medications  . ALPRAZolam (XANAX) 1 MG tablet    Sig: Take 1 tablet (1 mg total) by mouth at bedtime as needed for anxiety.    Dispense:  30 tablet    Refill:  3    Order Specific Question:   Supervising Provider    Answer:   Lavera Guise [[id number]]  . ergocalciferol (DRISDOL) 1.25 MG (50000 UT) capsule    Sig: Take 1 capsule (50,000 Units total) by mouth once a week.    Dispense:  12 capsule    Refill:  3    Order Specific Question:   Supervising Provider    Answer:   Lavera Guise [[id number]]  . esomeprazole (NEXIUM) 40 MG packet    Sig: Take 40 mg by mouth daily before breakfast.    Dispense:  90 each    Refill:  3    Order Specific Question:   Supervising Provider    Answer:   Lavera Guise [place]  Total time spent: 30 Minutes   Time spent includes review of chart, medications, test results, and follow up plan with the patient.      Dr Lavera Guise Internal medicine

## 2020-03-11 MED ORDER — ESOMEPRAZOLE MAGNESIUM 40 MG PO PACK: 40.0000 mg | PACK | Freq: Every day | ORAL | 3 refills | Status: DC

## 2020-03-11 MED ORDER — ERGOCALCIFEROL 1.25 MG (50000 UT) PO CAPS: 50000.0000 [IU] | ORAL_CAPSULE | ORAL | 3 refills | Status: DC

## 2020-03-18 DIAGNOSIS — E559 Vitamin D deficiency, unspecified: Secondary | ICD-10-CM | POA: Insufficient documentation

## 2020-03-18 DIAGNOSIS — D509 Iron deficiency anemia, unspecified: Secondary | ICD-10-CM | POA: Insufficient documentation

## 2020-05-06 ENCOUNTER — Other Ambulatory Visit: Payer: Self-pay

## 2020-05-06 DIAGNOSIS — E039 Hypothyroidism, unspecified: Secondary | ICD-10-CM

## 2020-05-06 MED ORDER — SYNTHROID 112 MCG PO TABS: 112.0000 ug | ORAL_TABLET | Freq: Every day | ORAL | 1 refills | Status: DC

## 2020-05-28 ENCOUNTER — Encounter: Payer: Self-pay | Admitting: Nurse Practitioner

## 2020-05-28 ENCOUNTER — Ambulatory Visit: Payer: 59 | Admitting: Nurse Practitioner

## 2020-05-28 ENCOUNTER — Other Ambulatory Visit: Payer: Self-pay

## 2020-05-28 VITALS — BP 112/74 | HR 81 | Temp 97.6°F | Resp 16 | Ht 63.0 in | Wt 133.0 lb

## 2020-05-28 DIAGNOSIS — K219 Gastro-esophageal reflux disease without esophagitis: Secondary | ICD-10-CM

## 2020-05-28 DIAGNOSIS — F411 Generalized anxiety disorder: Secondary | ICD-10-CM | POA: Diagnosis not present

## 2020-05-28 DIAGNOSIS — R079 Chest pain, unspecified: Secondary | ICD-10-CM

## 2020-05-28 DIAGNOSIS — F5102 Adjustment insomnia: Secondary | ICD-10-CM | POA: Diagnosis not present

## 2020-05-28 DIAGNOSIS — F988 Other specified behavioral and emotional disorders with onset usually occurring in childhood and adolescence: Secondary | ICD-10-CM

## 2020-05-28 MED ORDER — ESOMEPRAZOLE MAGNESIUM 40 MG PO CPDR: 40.0000 mg | DELAYED_RELEASE_CAPSULE | Freq: Two times a day (BID) | ORAL | 1 refills | Status: DC

## 2020-05-28 MED ORDER — FLUOXETINE HCL 20 MG PO TABS: 20.0000 mg | ORAL_TABLET | Freq: Every day | ORAL | 1 refills | Status: DC

## 2020-05-28 MED ORDER — LISDEXAMFETAMINE DIMESYLATE 30 MG PO CAPS: 30.0000 mg | ORAL_CAPSULE | Freq: Every day | ORAL | 0 refills | Status: DC

## 2020-05-28 NOTE — Progress Notes (Signed)
[facility] [place], [place] [address]  Internal MEDICINE  Office Visit Note  Patient Name: Yesenia Ward  [id number]  [id number]  Date of Service: 06/14/2020  Chief Complaint  Patient presents with  . Follow-up    anti depressant    The patient is here for routine follow up. She states that she has recently started back to school. 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 She states that she recently took five prerequisite classes. She states that she is having a difficult time staying on track in classes. She states that she has to read the same passages in text books multiple times and is still not retaining the information she read. She states that this new stressor is causing her to have increased anxiety. It is stressing her marriage. The patient was given a self-assessment for adult onset ADHD. She has scored 4/6 on Conner's self-assessment tool, indicating that she has a high likelihood that she has adult ADD.       Current Medication: Outpatient Encounter Medications as of 05/28/2020  Medication Sig Note  . acetaminophen (TYLENOL) 500 MG tablet Take 500 mg by mouth every 6 (six) hours as needed.   . ALPRAZolam (XANAX) 1 MG tablet Take 1 tablet (1 mg total) by mouth at bedtime as needed for anxiety.   . ergocalciferol (DRISDOL) 1.25 MG (50000 UT) capsule Take 1 capsule (50,000 Units total) by mouth once a week.   . Estradiol-Progesterone (BIJUVA) 1-100 MG CAPS Take by mouth.   . Fexofenadine-Pseudoephedrine (ALLEGRA-D 12 HOUR PO) Take by mouth.   . imatinib (GLEEVEC) 100 MG tablet Take 100 mg by mouth 2 (two) times daily. Take with meals and large glass of water.Caution:Chemotherapy   . ondansetron (ZOFRAN) 4 MG tablet Take 1 tablet (4 mg total) by mouth every 8 (eight) hours as needed for nausea or vomiting.   Marland Kitchen tiZANidine (ZANAFLEX) 2 MG tablet Take 2 mg by mouth at bedtime.   . [DISCONTINUED] esomeprazole (NEXIUM) 40 MG packet Take 40 mg  by mouth daily before breakfast. 05/28/2020: changed to capsule  . [DISCONTINUED] SYNTHROID 112 MCG tablet Take 1 tablet (112 mcg total) by mouth daily before breakfast.   . amphetamine-dextroamphetamine (ADDERALL) 10 MG tablet Take 1 tablet (10 mg total) by mouth 2 (two) times daily as needed.   Marland Kitchen esomeprazole (NEXIUM) 40 MG capsule Take 1 capsule (40 mg total) by mouth 2 (two) times daily before a meal.   . [DISCONTINUED] FLUoxetine (PROZAC) 20 MG tablet Take 1 tablet (20 mg total) by mouth daily. 06/03/2020: patient's insurance prefers capsules   . [DISCONTINUED] lisdexamfetamine (VYVANSE) 30 MG capsule Take 1 capsule (30 mg total) by mouth daily.    No facility-administered encounter medications on file as of 05/28/2020.    Surgical History: Past Surgical History:  Procedure Laterality Date  . AUGMENTATION MAMMAPLASTY Bilateral 2005  . gist tumor remooval    . OVARIAN CYST REMOVAL Left   . TUBAL LIGATION      Medical History: Past Medical History:  Diagnosis Date  . GIST (gastrointestinal stromal tumor), non-malignant   . Hypothyroid   . Tumor     Family History: Family History  Problem Relation Age of Onset  . Diabetes Mother   . Coronary artery disease Mother   . Rectal cancer Paternal Aunt   . Cancer Paternal Aunt   . Cirrhosis Father   . Cancer Maternal Uncle     Social History   Socioeconomic History  .  Marital status: Married    Spouse name: Not on file  . Number of children: Not on file  . Years of education: Not on file  . Highest education level: Not on file  Occupational History  . Not on file  Tobacco Use  . Smoking status: Former Research scientist (life sciences)  . Smokeless tobacco: Never Used  Vaping Use  . Vaping Use: Never used  Substance and Sexual Activity  . Alcohol use: Yes    Comment: occasionally  . Drug use: Never  . Sexual activity: Not on file  Other Topics Concern  . Not on file  Social History Narrative  . Not on file   Social Determinants of Health    Financial Resource Strain:   . Difficulty of Paying Living Expenses: Not on file  Food Insecurity:   . Worried About Charity fundraiser in the Last Year: Not on file  . Ran Out of Food in the Last Year: Not on file  Transportation Needs:   . Lack of Transportation (Medical): Not on file  . Lack of Transportation (Non-Medical): Not on file  Physical Activity:   . Days of Exercise per Week: Not on file  . Minutes of Exercise per Session: Not on file  Stress:   . Feeling of Stress : Not on file  Social Connections:   . Frequency of Communication with Friends and Family: Not on file  . Frequency of Social Gatherings with Friends and Family: Not on file  . Attends Religious Services: Not on file  . Active Member of Clubs or Organizations: Not on file  . Attends Archivist Meetings: Not on file  . Marital Status: Not on file  Intimate Partner Violence:   . Fear of Current or Ex-Partner: Not on file  . Emotionally Abused: Not on file  . Physically Abused: Not on file  . Sexually Abused: Not on file      Review of Systems  Constitutional: Negative for activity change, chills, fatigue and unexpected weight change.  HENT: Negative for congestion, postnasal drip, rhinorrhea, sneezing and sore throat.   Respiratory: Negative for cough, chest tightness, shortness of breath and wheezing.   Cardiovascular: Negative for chest pain and palpitations.       Has had some chest discomfort when having episodes of anxiety.   Gastrointestinal: Positive for nausea. Negative for abdominal pain, constipation, diarrhea and vomiting.       Acid reflux.   Endocrine: Negative for cold intolerance, heat intolerance, polydipsia and polyuria.       Chronic hypothyroid which has been well-managed.   Musculoskeletal: Negative for arthralgias, back pain, joint swelling and neck pain.  Skin: Negative for rash.  Allergic/Immunologic: Positive for environmental allergies.  Neurological: Negative for  dizziness, tremors, numbness and headaches.  Hematological: Negative for adenopathy. Does not bruise/bleed easily.  Psychiatric/Behavioral: Positive for decreased concentration and sleep disturbance. Negative for behavioral problems (Depression) and suicidal ideas. The patient is nervous/anxious.    Today's Vitals   05/28/20 0906  BP: 112/74  Pulse: 81  Resp: 16  Temp: 97.6 F (36.4 C)  SpO2: 93%  Weight: 133 lb (60.3 kg)  Height: 5\' 3"  (1.6 m)   Body mass index is 23.56 kg/m.  Physical Exam Vitals and nursing note reviewed.  Constitutional:      General: She is not in acute distress.    Appearance: Normal appearance. She is well-developed. She is not diaphoretic.  HENT:     Head: Normocephalic and atraumatic.  Nose: Nose normal.     Mouth/Throat:     Pharynx: No oropharyngeal exudate.  Eyes:     Pupils: Pupils are equal, round, and reactive to light.  Neck:     Thyroid: No thyromegaly.     Vascular: No carotid bruit or JVD.     Trachea: No tracheal deviation.  Cardiovascular:     Rate and Rhythm: Normal rate and regular rhythm.     Heart sounds: Normal heart sounds. No murmur heard.  No friction rub. No gallop.      Comments: ECG done in the office today is within normal limits.  Pulmonary:     Effort: Pulmonary effort is normal. No respiratory distress.     Breath sounds: Normal breath sounds. No wheezing or rales.  Chest:     Chest wall: No tenderness.  Abdominal:     Palpations: Abdomen is soft.  Musculoskeletal:        General: Normal range of motion.     Cervical back: Normal range of motion and neck supple.  Lymphadenopathy:     Cervical: No cervical adenopathy.  Skin:    General: Skin is warm and dry.  Neurological:     Mental Status: She is alert and oriented to person, place, and time.     Cranial Nerves: No cranial nerve deficit.  Psychiatric:        Attention and Perception: Attention and perception normal.        Mood and Affect: Affect  normal. Mood is anxious.        Speech: Speech normal.        Behavior: Behavior normal. Behavior is cooperative.        Thought Content: Thought content normal.        Cognition and Memory: Cognition and memory normal.        Judgment: Judgment normal.    Assessment/Plan: 1. Chest pain, unspecified type - EKG 12-Lead in the office today is within normal limits. Suspect episodes of chest pain due to anxiety. Will continue to monitor.   2. Adult attention deficit disorder Conner's self-assessment for ADHD administered today. She scored 4/6 indicating presence of adult ADD. Initially prescribed vyvanse daily as needed. Insurance does not cover this. Change to adderall 10mg  up to twice daily as needed. Single 30 day prescription sent to her pharmacy.  - amphetamine-dextroamphetamine (ADDERALL) 10 MG tablet; Take 1 tablet (10 mg total) by mouth 2 (two) times daily as needed.  Dispense: 60 tablet; Refill: 0  3. Generalized anxiety disorder Start proxac at 20mg  daily. May continue to take previously prescribed alprazolam on as needed basis only.   4. Adjustment insomnia May continue to take previously prescribed alprazolam on as needed basis only.    5. Gastroesophageal reflux disease without esophagitis Continue nexium 40mg  twice daily as needed.  - esomeprazole (NEXIUM) 40 MG capsule; Take 1 capsule (40 mg total) by mouth 2 (two) times daily before a meal.  Dispense: 180 capsule; Refill: 1  General Counseling: Kellye verbalizes understanding of the findings of todays visit and agrees with plan of treatment. I have discussed any further diagnostic evaluation that may be needed or ordered today. We also reviewed her medications today. she has been encouraged to call the office with any questions or concerns that should arise related to todays visit.  Refilled Controlled medications today. Reviewed risks and possible side effects associated with taking Stimulants. Combination of these drugs with  other psychotropic medications could cause dizziness and drowsiness. Pt  needs to Monitor symptoms and exercise caution in driving and operating heavy machinery to avoid damages to oneself, to others and to the surroundings. Patient verbalized understanding in this matter. Dependence and abuse for these drugs will be monitored closely. A Controlled substance policy and procedure is on file which allows [facility] to order a urine drug screen test at any visit. Patient understands and agrees with the plan..  This patient was seen by Leretha Pol FNP Collaboration with Dr Lavera Guise as a part of collaborative care agreement  Orders Placed This Encounter  Procedures  . EKG 12-Lead    Meds ordered this encounter  Medications  . esomeprazole (NEXIUM) 40 MG capsule    Sig: Take 1 capsule (40 mg total) by mouth 2 (two) times daily before a meal.    Dispense:  180 capsule    Refill:  1    Order Specific Question:   Supervising Provider    Answer:   Lavera Guise [place]  . DISCONTD: lisdexamfetamine (VYVANSE) 30 MG capsule    Sig: Take 1 capsule (30 mg total) by mouth daily.    Dispense:  30 capsule    Refill:  0    Order Specific Question:   Supervising Provider    Answer:   Lavera Guise [[id number]]  . DISCONTD: FLUoxetine (PROZAC) 20 MG tablet    Sig: Take 1 tablet (20 mg total) by mouth daily.    Dispense:  30 tablet    Refill:  1    Order Specific Question:   Supervising Provider    Answer:   Lavera Guise [[id number]]  . amphetamine-dextroamphetamine (ADDERALL) 10 MG tablet    Sig: Take 1 tablet (10 mg total) by mouth 2 (two) times daily as needed.    Dispense:  60 tablet    Refill:  0    Please d/c prescription for vyvanse as it is not covered by her insurance    Order Specific Question:   Supervising Provider    Answer:   Lavera Guise [[id number]]    Total time spent: 38 Minutes   Time spent includes review of chart, medications, test results, and follow up plan with the  patient.      Dr Lavera Guise Internal medicine

## 2020-05-29 ENCOUNTER — Telehealth: Payer: Self-pay

## 2020-06-02 MED ORDER — AMPHETAMINE-DEXTROAMPHETAMINE 10 MG PO TABS: 10.0000 mg | ORAL_TABLET | Freq: Two times a day (BID) | ORAL | 0 refills | Status: DC | PRN

## 2020-06-03 ENCOUNTER — Other Ambulatory Visit: Payer: Self-pay | Admitting: Nurse Practitioner

## 2020-06-03 ENCOUNTER — Telehealth: Payer: Self-pay

## 2020-06-03 DIAGNOSIS — F411 Generalized anxiety disorder: Secondary | ICD-10-CM

## 2020-06-03 DIAGNOSIS — F988 Other specified behavioral and emotional disorders with onset usually occurring in childhood and adolescence: Secondary | ICD-10-CM

## 2020-06-03 MED ORDER — AMPHETAMINE-DEXTROAMPHET ER 20 MG PO CP24: 20.0000 mg | ORAL_CAPSULE | ORAL | 0 refills | Status: DC

## 2020-06-03 MED ORDER — FLUOXETINE HCL 20 MG PO CAPS: 20.0000 mg | ORAL_CAPSULE | Freq: Every day | ORAL | 1 refills | Status: DC

## 2020-06-03 NOTE — Telephone Encounter (Signed)
Changed fluoxetine 20mg  to capsules per patient's insurance preference. Also changed aderall to 20mg  ER. Take 1 time po QD when needed. Should skip dose when not working. If needed, she can use the previously prescribed 10mg  tablets in afternoons. New prescriptions were sent to [facility] in [place]

## 2020-06-03 NOTE — Telephone Encounter (Signed)
Informed pt that we sent in new prescription for the fluoxitine as capsules and heather sent in adderall ER to pharmacy.  I also explained to pt on instructions heather gave.  dbs

## 2020-06-03 NOTE — Progress Notes (Signed)
Changed fluoxetine 20mg  to capsules per patient's insurance preference. Also changed aderall to 20mg  ER. Take 1 time po QD when needed. Should skip dose when not working. If needed, she can use the previously prescribed 10mg  tablets in afternoons. New prescriptions were sent to [facility] in [place].

## 2020-06-08 ENCOUNTER — Other Ambulatory Visit: Payer: Self-pay

## 2020-06-08 DIAGNOSIS — E039 Hypothyroidism, unspecified: Secondary | ICD-10-CM

## 2020-06-09 ENCOUNTER — Other Ambulatory Visit: Payer: Self-pay

## 2020-06-09 DIAGNOSIS — E039 Hypothyroidism, unspecified: Secondary | ICD-10-CM

## 2020-06-09 MED ORDER — SYNTHROID 112 MCG PO TABS: 112.0000 ug | ORAL_TABLET | Freq: Every day | ORAL | 1 refills | Status: DC

## 2020-06-09 NOTE — Telephone Encounter (Signed)
For this pt, Vyvanse 30mg  capsules is not covered, they sent a list of alternative meds: guanfacine HCI ER, atomoxetine HCI, methylphenidate HCI, amphetamine-dextroamphet ER, amphetamine-dextroamphetamine. Heather received list.

## 2020-06-14 DIAGNOSIS — R079 Chest pain, unspecified: Secondary | ICD-10-CM | POA: Insufficient documentation

## 2020-06-14 DIAGNOSIS — F988 Other specified behavioral and emotional disorders with onset usually occurring in childhood and adolescence: Secondary | ICD-10-CM | POA: Insufficient documentation

## 2020-06-14 DIAGNOSIS — F411 Generalized anxiety disorder: Secondary | ICD-10-CM | POA: Insufficient documentation

## 2020-06-25 ENCOUNTER — Ambulatory Visit: Payer: Self-pay | Admitting: Nurse Practitioner

## 2020-07-09 ENCOUNTER — Ambulatory Visit: Payer: 59 | Admitting: Hospice and Palliative Medicine

## 2020-07-09 ENCOUNTER — Encounter: Payer: Self-pay | Admitting: Hospice and Palliative Medicine

## 2020-07-09 ENCOUNTER — Other Ambulatory Visit: Payer: Self-pay

## 2020-07-09 ENCOUNTER — Ambulatory Visit: Payer: Self-pay | Admitting: Hospice and Palliative Medicine

## 2020-07-09 DIAGNOSIS — K219 Gastro-esophageal reflux disease without esophagitis: Secondary | ICD-10-CM | POA: Diagnosis not present

## 2020-07-09 DIAGNOSIS — F5102 Adjustment insomnia: Secondary | ICD-10-CM

## 2020-07-09 DIAGNOSIS — F988 Other specified behavioral and emotional disorders with onset usually occurring in childhood and adolescence: Secondary | ICD-10-CM

## 2020-07-09 DIAGNOSIS — Z79899 Other long term (current) drug therapy: Secondary | ICD-10-CM

## 2020-07-09 LAB — POCT URINE DRUG SCREEN
Methylenedioxyamphetamine: NOT DETECTED
POC Amphetamine UR: NOT DETECTED
POC BENZODIAZEPINES UR: POSITIVE — AB
POC Barbiturate UR: NOT DETECTED
POC Cocaine UR: NOT DETECTED
POC Ecstasy UR: NOT DETECTED
POC Marijuana UR: NOT DETECTED
POC Methadone UR: NOT DETECTED
POC Methamphetamine UR: NOT DETECTED
POC Opiate Ur: NOT DETECTED
POC Oxycodone UR: NOT DETECTED
POC PHENCYCLIDINE UR: NOT DETECTED
POC TRICYCLICS UR: NOT DETECTED

## 2020-07-09 MED ORDER — ALPRAZOLAM 1 MG PO TABS: 1.0000 mg | ORAL_TABLET | Freq: Every evening | ORAL | 0 refills | Status: DC | PRN

## 2020-07-09 MED ORDER — AMPHETAMINE-DEXTROAMPHET ER 20 MG PO CP24: 20.0000 mg | ORAL_CAPSULE | ORAL | 0 refills | Status: DC

## 2020-07-09 MED ORDER — AMPHETAMINE-DEXTROAMPHETAMINE 10 MG PO TABS: 10.0000 mg | ORAL_TABLET | Freq: Every day | ORAL | 0 refills | Status: DC | PRN

## 2020-07-09 NOTE — Progress Notes (Signed)
[facility] [place], [place] [address]  Internal MEDICINE  Office Visit Note  Patient Name: Yesenia Ward  [id number]  [id number]  Date of Service: 07/09/2020  Chief Complaint  Patient presents with  . Follow-up    refill request   . policy update form    received    HPI Patient is here for routine follow-up Was recently started on Adderall to help with focus and concentration-she recently enrolled in a BSN program and found herself struggling with paying attention to lectures and ability to work on assignments and finished them on time Since starting Adderall she has noticed a significant improvement in her focus and ability to complete assignments  She was also started on fluoxetine for increased anxiety and also continues to notice significant improvement in her anxiety levels Occasionally feels the need to take alprazolam at night for sleep--mostly on nights prior to exam  Denies any negative side effects from medications, denies chest pain or palpitations, no increase in sleep disturbances   Current Medication: Outpatient Encounter Medications as of 07/09/2020  Medication Sig  . acetaminophen (TYLENOL) 500 MG tablet Take 500 mg by mouth every 6 (six) hours as needed.  . ALPRAZolam (XANAX) 1 MG tablet Take 1 tablet (1 mg total) by mouth at bedtime as needed for anxiety.  Marland Kitchen amphetamine-dextroamphetamine (ADDERALL XR) 20 MG 24 hr capsule Take 1 capsule (20 mg total) by mouth every morning.  . ergocalciferol (DRISDOL) 1.25 MG (50000 UT) capsule Take 1 capsule (50,000 Units total) by mouth once a week.  . esomeprazole (NEXIUM) 40 MG capsule Take 1 capsule (40 mg total) by mouth 2 (two) times daily before a meal.  . Estradiol-Progesterone (BIJUVA) 1-100 MG CAPS Take by mouth.  . Fexofenadine-Pseudoephedrine (ALLEGRA-D 12 HOUR PO) Take by mouth.  Marland Kitchen FLUoxetine (PROZAC) 20 MG capsule Take 1 capsule (20 mg total) by mouth daily.  Marland Kitchen imatinib (GLEEVEC) 100  MG tablet Take 100 mg by mouth 2 (two) times daily. Take with meals and large glass of water.Caution:Chemotherapy  . ondansetron (ZOFRAN) 4 MG tablet Take 1 tablet (4 mg total) by mouth every 8 (eight) hours as needed for nausea or vomiting.  Marland Kitchen SYNTHROID 112 MCG tablet Take 1 tablet (112 mcg total) by mouth daily before breakfast.  . [DISCONTINUED] amphetamine-dextroamphetamine (ADDERALL XR) 20 MG 24 hr capsule Take 1 capsule (20 mg total) by mouth every morning.  . [DISCONTINUED] amphetamine-dextroamphetamine (ADDERALL) 10 MG tablet Take 1 tablet (10 mg total) by mouth 2 (two) times daily as needed.  Marland Kitchen amphetamine-dextroamphetamine (ADDERALL) 10 MG tablet Take 1 tablet (10 mg total) by mouth daily as needed.  . [DISCONTINUED] tiZANidine (ZANAFLEX) 2 MG tablet Take 2 mg by mouth at bedtime. (Patient not taking: Reported on 07/09/2020)   No facility-administered encounter medications on file as of 07/09/2020.    Surgical History: Past Surgical History:  Procedure Laterality Date  . AUGMENTATION MAMMAPLASTY Bilateral 2005  . gist tumor remooval    . OVARIAN CYST REMOVAL Left   . TUBAL LIGATION      Medical History: Past Medical History:  Diagnosis Date  . GIST (gastrointestinal stromal tumor), non-malignant   . Hypothyroid   . Tumor     Family History: Family History  Problem Relation Age of Onset  . Diabetes Mother   . Coronary artery disease Mother   . Rectal cancer Paternal Aunt   . Cancer Paternal Aunt   . Cirrhosis Father   . Cancer Maternal Uncle  Social History   Socioeconomic History  . Marital status: Married    Spouse name: Not on file  . Number of children: Not on file  . Years of education: Not on file  . Highest education level: Not on file  Occupational History  . Not on file  Tobacco Use  . Smoking status: Former Research scientist (life sciences)  . Smokeless tobacco: Never Used  Vaping Use  . Vaping Use: Never used  Substance and Sexual Activity  . Alcohol use: Yes     Comment: occasionally  . Drug use: Never  . Sexual activity: Not on file  Other Topics Concern  . Not on file  Social History Narrative  . Not on file   Social Determinants of Health   Financial Resource Strain:   . Difficulty of Paying Living Expenses: Not on file  Food Insecurity:   . Worried About Charity fundraiser in the Last Year: Not on file  . Ran Out of Food in the Last Year: Not on file  Transportation Needs:   . Lack of Transportation (Medical): Not on file  . Lack of Transportation (Non-Medical): Not on file  Physical Activity:   . Days of Exercise per Week: Not on file  . Minutes of Exercise per Session: Not on file  Stress:   . Feeling of Stress : Not on file  Social Connections:   . Frequency of Communication with Friends and Family: Not on file  . Frequency of Social Gatherings with Friends and Family: Not on file  . Attends Religious Services: Not on file  . Active Member of Clubs or Organizations: Not on file  . Attends Archivist Meetings: Not on file  . Marital Status: Not on file  Intimate Partner Violence:   . Fear of Current or Ex-Partner: Not on file  . Emotionally Abused: Not on file  . Physically Abused: Not on file  . Sexually Abused: Not on file      Review of Systems  Constitutional: Negative for chills, diaphoresis and fatigue.  HENT: Negative for ear pain, postnasal drip and sinus pressure.   Eyes: Negative for photophobia, discharge, redness, itching and visual disturbance.  Respiratory: Negative for cough, shortness of breath and wheezing.   Cardiovascular: Negative for chest pain, palpitations and leg swelling.  Gastrointestinal: Negative for abdominal pain, constipation, diarrhea, nausea and vomiting.  Genitourinary: Negative for dysuria and flank pain.  Musculoskeletal: Negative for arthralgias, back pain, gait problem and neck pain.  Skin: Negative for color change.  Allergic/Immunologic: Negative for environmental  allergies and food allergies.  Neurological: Negative for dizziness and headaches.  Hematological: Does not bruise/bleed easily.  Psychiatric/Behavioral: Negative for agitation, behavioral problems (depression) and hallucinations.    Vital Signs: There were no vitals taken for this visit.   Physical Exam Vitals reviewed.  Constitutional:      Appearance: Normal appearance. She is normal weight.  Cardiovascular:     Rate and Rhythm: Normal rate and regular rhythm.     Pulses: Normal pulses.     Heart sounds: Normal heart sounds.  Pulmonary:     Effort: Pulmonary effort is normal.     Breath sounds: Normal breath sounds.  Abdominal:     General: Abdomen is flat.  Musculoskeletal:        General: Normal range of motion.     Cervical back: Normal range of motion.  Skin:    General: Skin is warm.  Neurological:     General: No focal deficit  present.     Mental Status: She is alert and oriented to person, place, and time. Mental status is at baseline.  Psychiatric:        Mood and Affect: Mood normal.        Behavior: Behavior normal.        Thought Content: Thought content normal.    Assessment/Plan: 1. Gastroesophageal reflux disease without esophagitis Symptoms remain stable at this time, continue with monitoring  2. Adult attention deficit disorder Continue taking 20 mg XR daily and as needed may take second dose of 10 mg--has only been taking the second dose on class days Discussed adderall will not be an appropriate long term treatment and once she has completed BSN program will need to wean off and discontinue medication - amphetamine-dextroamphetamine (ADDERALL XR) 20 MG 24 hr capsule; Take 1 capsule (20 mg total) by mouth every morning.  Dispense: 30 capsule; Refill: 0 - amphetamine-dextroamphetamine (ADDERALL) 10 MG tablet; Take 1 tablet (10 mg total) by mouth daily as needed.  Dispense: 30 tablet; Refill: 0  3. Adjustment insomnia Take alprazolam as needed for  insomnia--monitor frequency of use--may indicate a need to lower stimulant dosage - ALPRAZolam (XANAX) 1 MG tablet; Take 1 tablet (1 mg total) by mouth at bedtime as needed for anxiety.  Dispense: 30 tablet; Refill: 0  4. Encounter for long-term (current) use of medications - POCT Urine Drug Screen  General Counseling: Shalyn verbalizes understanding of the findings of todays visit and agrees with plan of treatment. I have discussed any further diagnostic evaluation that may be needed or ordered today. We also reviewed her medications today. she has been encouraged to call the office with any questions or concerns that should arise related to todays visit.    Orders Placed This Encounter  Procedures  . POCT Urine Drug Screen    Meds ordered this encounter  Medications  . amphetamine-dextroamphetamine (ADDERALL XR) 20 MG 24 hr capsule    Sig: Take 1 capsule (20 mg total) by mouth every morning.    Dispense:  30 capsule    Refill:  0  . amphetamine-dextroamphetamine (ADDERALL) 10 MG tablet    Sig: Take 1 tablet (10 mg total) by mouth daily as needed.    Dispense:  30 tablet    Refill:  0    Time spent:30 Minutes Time spent includes review of chart, medications, test results and follow-up plan with the patient.  This patient was seen by Theodoro Grist AGNP-C in Collaboration with Dr Lavera Guise as a part of collaborative care agreement     Tanna Furry. Min Tunnell AGNP-C Internal medicine

## 2020-07-10 ENCOUNTER — Ambulatory Visit: Payer: Self-pay | Admitting: Nurse Practitioner

## 2020-07-14 ENCOUNTER — Encounter: Payer: Self-pay | Admitting: Hospice and Palliative Medicine

## 2020-08-10 ENCOUNTER — Other Ambulatory Visit: Payer: Self-pay

## 2020-08-10 DIAGNOSIS — F411 Generalized anxiety disorder: Secondary | ICD-10-CM

## 2020-08-10 MED ORDER — FLUOXETINE HCL 20 MG PO CAPS: 20.0000 mg | ORAL_CAPSULE | Freq: Every day | ORAL | 1 refills | Status: DC

## 2020-08-18 ENCOUNTER — Encounter: Payer: Self-pay | Admitting: Nurse Practitioner

## 2020-08-24 ENCOUNTER — Other Ambulatory Visit: Payer: Self-pay

## 2020-08-24 ENCOUNTER — Ambulatory Visit: Payer: 59 | Admitting: Nurse Practitioner

## 2020-08-24 VITALS — BP 133/98 | HR 96 | Temp 98.0°F | Resp 16 | Ht 63.0 in | Wt 132.0 lb

## 2020-08-24 DIAGNOSIS — K219 Gastro-esophageal reflux disease without esophagitis: Secondary | ICD-10-CM | POA: Diagnosis not present

## 2020-08-24 DIAGNOSIS — F5102 Adjustment insomnia: Secondary | ICD-10-CM

## 2020-08-24 DIAGNOSIS — E039 Hypothyroidism, unspecified: Secondary | ICD-10-CM | POA: Diagnosis not present

## 2020-08-24 DIAGNOSIS — F988 Other specified behavioral and emotional disorders with onset usually occurring in childhood and adolescence: Secondary | ICD-10-CM

## 2020-08-24 MED ORDER — AMPHETAMINE-DEXTROAMPHETAMINE 10 MG PO TABS: 10.0000 mg | ORAL_TABLET | Freq: Every day | ORAL | 0 refills | Status: DC | PRN

## 2020-08-24 MED ORDER — AMPHETAMINE-DEXTROAMPHET ER 20 MG PO CP24: 20.0000 mg | ORAL_CAPSULE | ORAL | 0 refills | Status: DC

## 2020-08-24 MED ORDER — ALPRAZOLAM 1 MG PO TABS: 1.0000 mg | ORAL_TABLET | Freq: Every evening | ORAL | 1 refills | Status: DC | PRN

## 2020-08-24 NOTE — Progress Notes (Signed)
[facility] [address] [place], [place] [address]  Internal MEDICINE  Office Visit Note  Patient Name: Yesenia Ward  [id number]  [id number]  Date of Service: 09/17/2020  Chief Complaint  Patient presents with  . controlled substance    Reviewed   . Follow-up    The patient is here for routine follow up. She has now started on prozac. She states that this has helped her tremendously while handling the stress of balancing school and full time work and family.  Was also started on Adderall to help with focus and concentration-she recently enrolled in a BSN program and found herself struggling with paying attention to lectures and ability to work on assignments and finished them on time We found that taking single adderall XR 20mg  in the morning was very helpful. She does have prn prescription for adderall 10mg  which she does take on particularly long days, Since starting Adderall she has noticed a significant improvement in her focus and ability to complete assignments. She has done well and has had no negative side effects from taking this combination.       Current Medication: Outpatient Encounter Medications as of 08/24/2020  Medication Sig  . acetaminophen (TYLENOL) 500 MG tablet Take 500 mg by mouth every 6 (six) hours as needed.  . ergocalciferol (DRISDOL) 1.25 MG (50000 UT) capsule Take 1 capsule (50,000 Units total) by mouth once a week.  . esomeprazole (NEXIUM) 40 MG capsule Take 1 capsule (40 mg total) by mouth 2 (two) times daily before a meal.  . Estradiol-Progesterone 1-100 MG CAPS Take by mouth.  . Fexofenadine-Pseudoephedrine (ALLEGRA-D 12 HOUR PO) Take by mouth.  FLUoxetine (PROZAC) 20 MG capsule Take 1 capsule (20 mg total) by mouth daily.  08/26/2020 imatinib (GLEEVEC) 100 MG tablet Take 100 mg by mouth 2 (two) times daily. Take with meals and large glass of water.Caution:Chemotherapy  . ondansetron (ZOFRAN) 4 MG tablet Take 1 tablet (4 mg total) by mouth  every 8 (eight) hours as needed for nausea or vomiting.  Marland Kitchen SYNTHROID 112 MCG tablet Take 1 tablet (112 mcg total) by mouth daily before breakfast.  . [DISCONTINUED] ALPRAZolam (XANAX) 1 MG tablet Take 1 tablet (1 mg total) by mouth at bedtime as needed for anxiety.  . [DISCONTINUED] amphetamine-dextroamphetamine (ADDERALL XR) 20 MG 24 hr capsule Take 1 capsule (20 mg total) by mouth every morning.  . [DISCONTINUED] amphetamine-dextroamphetamine (ADDERALL) 10 MG tablet Take 1 tablet (10 mg total) by mouth daily as needed.  . ALPRAZolam (XANAX) 1 MG tablet Take 1 tablet (1 mg total) by mouth at bedtime as needed for anxiety.  Marland Kitchen amphetamine-dextroamphetamine (ADDERALL XR) 20 MG 24 hr capsule Take 1 capsule (20 mg total) by mouth every morning.  Marland Kitchen amphetamine-dextroamphetamine (ADDERALL) 10 MG tablet Take 1 tablet (10 mg total) by mouth daily as needed.  . [DISCONTINUED] amphetamine-dextroamphetamine (ADDERALL XR) 20 MG 24 hr capsule Take 1 capsule (20 mg total) by mouth every morning.  . [DISCONTINUED] amphetamine-dextroamphetamine (ADDERALL) 10 MG tablet Take 1 tablet (10 mg total) by mouth daily as needed.   No facility-administered encounter medications on file as of 08/24/2020.    Surgical History: Past Surgical History:  Procedure Laterality Date  . AUGMENTATION MAMMAPLASTY Bilateral 2005  . gist tumor remooval    . OVARIAN CYST REMOVAL Left   . TUBAL LIGATION      Medical History: Past Medical History:  Diagnosis Date  . GIST (gastrointestinal stromal tumor), non-malignant   . Hypothyroid   .  Tumor     Family History: Family History  Problem Relation Age of Onset  . Diabetes Mother   . Coronary artery disease Mother   . Rectal cancer Paternal Aunt   . Cancer Paternal Aunt   . Cirrhosis Father   . Cancer Maternal Uncle     Social History   Socioeconomic History  . Marital status: Married    Spouse name: Not on file  . Number of children: Not on file  . Years of  education: Not on file  . Highest education level: Not on file  Occupational History  . Not on file  Tobacco Use  . Smoking status: Former Research scientist (life sciences)  . Smokeless tobacco: Never Used  Vaping Use  . Vaping Use: Never used  Substance and Sexual Activity  . Alcohol use: Yes    Comment: occasionally  . Drug use: Never  . Sexual activity: Not on file  Other Topics Concern  . Not on file  Social History Narrative  . Not on file   Social Determinants of Health   Financial Resource Strain: Not on file  Food Insecurity: Not on file  Transportation Needs: Not on file  Physical Activity: Not on file  Stress: Not on file  Social Connections: Not on file  Intimate Partner Violence: Not on file      Review of Systems  Constitutional: Negative for activity change, chills, fatigue and unexpected weight change.  HENT: Negative for congestion, postnasal drip, rhinorrhea, sneezing and sore throat.   Respiratory: Negative for cough, chest tightness, shortness of breath and wheezing.   Cardiovascular: Negative for chest pain and palpitations.  Gastrointestinal: Positive for nausea. Negative for abdominal pain, constipation, diarrhea and vomiting.  Endocrine: Negative for cold intolerance, heat intolerance, polydipsia and polyuria.       Chronic hypothyroid which has been well-managed.   Musculoskeletal: Negative for arthralgias, back pain, joint swelling and neck pain.  Skin: Negative for rash.  Allergic/Immunologic: Positive for environmental allergies.  Neurological: Negative for dizziness, tremors, numbness and headaches.  Hematological: Negative for adenopathy. Does not bruise/bleed easily.  Psychiatric/Behavioral: Positive for decreased concentration and sleep disturbance. Negative for behavioral problems (Depression) and suicidal ideas. The patient is nervous/anxious.        Doing very well with addition of prozac and adderall as needed.   Today's Vitals   08/24/20 1059  BP: (!) 133/98   Pulse: 96  Resp: 16  Temp: 98 F (36.7 C)  SpO2: 98%  Weight: 132 lb (59.9 kg)  Height: 5\' 3"  (1.6 m)   Body mass index is 23.38 kg/m.  Physical Exam Vitals and nursing note reviewed.  Constitutional:      General: She is not in acute distress.    Appearance: Normal appearance. She is well-developed and well-nourished. She is not diaphoretic.  HENT:     Head: Normocephalic and atraumatic.     Mouth/Throat:     Mouth: Oropharynx is clear and moist.     Pharynx: No oropharyngeal exudate.  Eyes:     Extraocular Movements: EOM normal.     Pupils: Pupils are equal, round, and reactive to light.  Neck:     Thyroid: No thyromegaly.     Vascular: No JVD.     Trachea: No tracheal deviation.  Cardiovascular:     Rate and Rhythm: Normal rate and regular rhythm.     Heart sounds: Normal heart sounds. No murmur heard. No friction rub. No gallop.   Pulmonary:     Effort:  Pulmonary effort is normal. No respiratory distress.     Breath sounds: Normal breath sounds. No wheezing or rales.  Chest:     Chest wall: No tenderness.  Abdominal:     Palpations: Abdomen is soft.  Musculoskeletal:        General: Normal range of motion.     Cervical back: Normal range of motion and neck supple.  Lymphadenopathy:     Cervical: No cervical adenopathy.  Skin:    General: Skin is warm and dry.  Neurological:     General: No focal deficit present.     Mental Status: She is alert and oriented to person, place, and time.     Cranial Nerves: No cranial nerve deficit.  Psychiatric:        Mood and Affect: Mood and affect and mood normal.        Behavior: Behavior normal.        Thought Content: Thought content normal.        Judgment: Judgment normal.    Assessment/Plan: 1. Acquired hypothyroidism Stable. Thyroid panel. Continue levothyroxine as prescribed   2. Gastroesophageal reflux disease without esophagitis Continue nexium as precribed   3. Adult attention deficit disorder Doing  well with adderall as prescribed. Will continue adderall XR 20mg  daily when needed. May take adderall 10mg  in the afternoon as needed for focus/concentration. Two 30 day prescriptions were sent to her pharmacy for both medications. Dates are 08/24/2020 and 09/22/2020.  - amphetamine-dextroamphetamine (ADDERALL XR) 20 MG 24 hr capsule; Take 1 capsule (20 mg total) by mouth every morning.  Dispense: 30 capsule; Refill: 0 - amphetamine-dextroamphetamine (ADDERALL) 10 MG tablet; Take 1 tablet (10 mg total) by mouth daily as needed.  Dispense: 30 tablet; Refill: 0  4. Adjustment insomnia May continue to take alprazolam 1mg  at bedtime only when needed. New prescription sent to her pharmacy today.  - ALPRAZolam (XANAX) 1 MG tablet; Take 1 tablet (1 mg total) by mouth at bedtime as needed for anxiety.  Dispense: 30 tablet; Refill: 1  General Counseling: Yesenia Ward verbalizes understanding of the findings of todays visit and agrees with plan of treatment. I have discussed any further diagnostic evaluation that may be needed or ordered today. We also reviewed her medications today. she has been encouraged to call the office with any questions or concerns that should arise related to todays visit.   This patient was seen by [place] with Dr Lavera Guise as a part of collaborative care agreement  Meds ordered this encounter  Medications  . DISCONTD: amphetamine-dextroamphetamine (ADDERALL XR) 20 MG 24 hr capsule    Sig: Take 1 capsule (20 mg total) by mouth every morning.    Dispense:  30 capsule    Refill:  0    Order Specific Question:   Supervising Provider    Answer:   Lavera Guise [id number]  . DISCONTD: amphetamine-dextroamphetamine (ADDERALL) 10 MG tablet    Sig: Take 1 tablet (10 mg total) by mouth daily as needed.    Dispense:  30 tablet    Refill:  0    Order Specific Question:   Supervising Provider    Answer:   Lavera Guise [id number]  . ALPRAZolam (XANAX) 1 MG tablet    Sig:  Take 1 tablet (1 mg total) by mouth at bedtime as needed for anxiety.    Dispense:  30 tablet    Refill:  1    Order Specific Question:   Supervising Provider  Answer:   Lavera Guise [place]  . amphetamine-dextroamphetamine (ADDERALL XR) 20 MG 24 hr capsule    Sig: Take 1 capsule (20 mg total) by mouth every morning.    Dispense:  30 capsule    Refill:  0    Fill after 09/22/2020    Order Specific Question:   Supervising Provider    Answer:   Lavera Guise [id number]  . amphetamine-dextroamphetamine (ADDERALL) 10 MG tablet    Sig: Take 1 tablet (10 mg total) by mouth daily as needed.    Dispense:  30 tablet    Refill:  0    Fill after 09/22/2020    Order Specific Question:   Supervising Provider    Answer:   Lavera Guise [id number]    Total time spent: 30 Minutes    Time spent includes review of chart, medications, test results, and follow up plan with the patient.      Dr Lavera Guise Internal medicine

## 2020-09-17 ENCOUNTER — Encounter: Payer: Self-pay | Admitting: Nurse Practitioner

## 2020-09-25 ENCOUNTER — Telehealth: Payer: Self-pay

## 2020-09-25 ENCOUNTER — Other Ambulatory Visit: Payer: Self-pay

## 2020-09-25 MED ORDER — AZITHROMYCIN 250 MG PO TABS: ORAL_TABLET | ORAL | 0 refills | Status: DC

## 2020-09-25 MED ORDER — PREDNISONE 10 MG PO TABS: 10.0000 mg | ORAL_TABLET | Freq: Every day | ORAL | 0 refills | Status: DC

## 2020-09-25 MED ORDER — ALBUTEROL SULFATE HFA 108 (90 BASE) MCG/ACT IN AERS: 2.0000 | INHALATION_SPRAY | Freq: Four times a day (QID) | RESPIRATORY_TRACT | 0 refills | Status: DC | PRN

## 2020-09-25 NOTE — Telephone Encounter (Signed)
Pt called that she tested positive for covid she is wheezing,dry cough and sinusitis as per taylor send antibiotic,prednisone and albuterol inhaler and advised her to drink plenty of water and rest and if she she is not feeling better we can she her as virtual visit

## 2020-09-28 ENCOUNTER — Other Ambulatory Visit: Payer: Self-pay

## 2020-09-28 MED ORDER — PREDNISONE 10 MG PO TABS: ORAL_TABLET | ORAL | 0 refills | Status: DC

## 2020-10-05 ENCOUNTER — Other Ambulatory Visit: Payer: Self-pay

## 2020-10-05 DIAGNOSIS — R112 Nausea with vomiting, unspecified: Secondary | ICD-10-CM

## 2020-10-05 MED ORDER — ONDANSETRON HCL 4 MG PO TABS: 4.0000 mg | ORAL_TABLET | Freq: Three times a day (TID) | ORAL | 0 refills | Status: DC | PRN

## 2020-10-19 ENCOUNTER — Other Ambulatory Visit: Payer: Self-pay | Admitting: Hospice and Palliative Medicine

## 2020-10-22 ENCOUNTER — Other Ambulatory Visit: Payer: Self-pay

## 2020-10-22 ENCOUNTER — Ambulatory Visit (INDEPENDENT_AMBULATORY_CARE_PROVIDER_SITE_OTHER): Payer: 59 | Admitting: Hospice and Palliative Medicine

## 2020-10-22 ENCOUNTER — Encounter: Payer: Self-pay | Admitting: Hospice and Palliative Medicine

## 2020-10-22 VITALS — BP 120/80 | HR 84 | Temp 97.5°F | Resp 16 | Ht 63.0 in | Wt 131.6 lb

## 2020-10-22 DIAGNOSIS — J310 Chronic rhinitis: Secondary | ICD-10-CM | POA: Diagnosis not present

## 2020-10-22 DIAGNOSIS — F988 Other specified behavioral and emotional disorders with onset usually occurring in childhood and adolescence: Secondary | ICD-10-CM

## 2020-10-22 DIAGNOSIS — F5102 Adjustment insomnia: Secondary | ICD-10-CM

## 2020-10-22 DIAGNOSIS — F411 Generalized anxiety disorder: Secondary | ICD-10-CM

## 2020-10-22 DIAGNOSIS — Z1231 Encounter for screening mammogram for malignant neoplasm of breast: Secondary | ICD-10-CM

## 2020-10-22 DIAGNOSIS — R5383 Other fatigue: Secondary | ICD-10-CM

## 2020-10-22 DIAGNOSIS — J329 Chronic sinusitis, unspecified: Secondary | ICD-10-CM

## 2020-10-22 MED ORDER — FLUOXETINE HCL 20 MG PO CAPS
20.0000 mg | ORAL_CAPSULE | Freq: Every day | ORAL | 1 refills | Status: DC
Start: 2020-10-22 — End: 2021-04-23

## 2020-10-22 MED ORDER — AMPHETAMINE-DEXTROAMPHET ER 20 MG PO CP24: 20.0000 mg | ORAL_CAPSULE | ORAL | 0 refills | Status: DC

## 2020-10-22 MED ORDER — AMPHETAMINE-DEXTROAMPHETAMINE 10 MG PO TABS: 10.0000 mg | ORAL_TABLET | Freq: Every day | ORAL | 0 refills | Status: DC | PRN

## 2020-10-22 MED ORDER — ALPRAZOLAM 0.5 MG PO TABS: ORAL_TABLET | ORAL | 0 refills | Status: DC

## 2020-10-22 MED ORDER — AZELASTINE HCL 0.1 % NA SOLN: 2.0000 | Freq: Two times a day (BID) | NASAL | 12 refills | Status: DC

## 2020-10-22 MED ORDER — MONTELUKAST SODIUM 10 MG PO TABS: 10.0000 mg | ORAL_TABLET | Freq: Every day | ORAL | 3 refills | Status: DC

## 2020-10-22 NOTE — Progress Notes (Signed)
[facility], [place] [address]  Internal MEDICINE  Office Visit Note  Patient Name: Yesenia Ward  [id number]  [id number]  Date of Service: 10/25/2020  Chief Complaint  Patient presents with  . 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, has recently had to drop down her work hours due to feeling overwhelmed with multiple school assignments Continues to take Adderall for ADHD while completing her BSN--during work and school days takes 20 mg daily, on off days from work and on the weekends takes 10 mg--this current regimen is working well for her, still wanting to discontinue medication once she has completed her schooling  C/o ongoing allergy issues, since moving her from [place] she has struggled with allergies, s/p COVID a few months ago and since viral infection she struggles to control her allergies Rhinorrhea, eyes watering, PND  History of GIST--followed by oncology in [place] annual female exams with GYN Colonoscopy up to date until 2029 Needs updated mammogram  Due for routine fasting labs  Current Medication: Outpatient Encounter Medications as of 10/22/2020  Medication Sig  . acetaminophen (TYLENOL) 500 MG tablet Take 500 mg by mouth every 6 (six) hours as needed.  . ALPRAZolam (XANAX) 0.5 MG tablet Take 1-2 tablets as needed at bedtime.  Marland Kitchen azelastine (ASTELIN) 0.1 % nasal spray Place 2 sprays into both nostrils 2 (two) times daily. Use in each nostril as directed  . ergocalciferol (DRISDOL) 1.25 MG (50000 UT) capsule Take 1 capsule (50,000 Units total) by mouth once a week.  . esomeprazole (NEXIUM) 40 MG capsule Take 1 capsule (40 mg total) by mouth 2 (two) times daily before a meal.  . Estradiol-Progesterone 1-100 MG CAPS Take by mouth.  . Fexofenadine-Pseudoephedrine (ALLEGRA-D 12 HOUR PO) Take by mouth.  . imatinib  (GLEEVEC) 100 MG tablet Take 100 mg by mouth 2 (two) times daily. Take with meals and large glass of water.Caution:Chemotherapy  . montelukast (SINGULAIR) 10 MG tablet Take 1 tablet (10 mg total) by mouth at bedtime.  . ondansetron (ZOFRAN) 4 MG tablet Take 1 tablet (4 mg total) by mouth every 8 (eight) hours as needed for nausea or vomiting.  Marland Kitchen SYNTHROID 112 MCG tablet Take 1 tablet (112 mcg total) by mouth daily before breakfast.  . [DISCONTINUED] albuterol (VENTOLIN HFA) 108 (90 Base) MCG/ACT inhaler INHALE 2 PUFFS INTO THE LUNGS EVERY 6 HOURS AS NEEDED FOR WHEEZING OR SHORTNESS OF BREATH  . [DISCONTINUED] ALPRAZolam (XANAX) 1 MG tablet Take 1 tablet (1 mg total) by mouth at bedtime as needed for anxiety.  . [DISCONTINUED] amphetamine-dextroamphetamine (ADDERALL XR) 20 MG 24 hr capsule Take 1 capsule (20 mg total) by mouth every morning.  . [DISCONTINUED] amphetamine-dextroamphetamine (ADDERALL) 10 MG tablet Take 1 tablet (10 mg total) by mouth daily as needed.  . [DISCONTINUED] azithromycin (ZITHROMAX) 250 MG tablet Use as directed for 5 days  . [DISCONTINUED] FLUoxetine (PROZAC) 20 MG capsule Take 1 capsule (20 mg total) by mouth daily.  . [DISCONTINUED] predniSONE (DELTASONE) 10 MG tablet Take 1 tab po 3 times a day for 3 days ,then take one tab po 2 times a day for 3 days and then take 1 tab a day for 3 days  . amphetamine-dextroamphetamine (ADDERALL XR) 20 MG 24 hr capsule Take 1 capsule (20 mg total) by mouth every morning.  Marland Kitchen amphetamine-dextroamphetamine (ADDERALL)  10 MG tablet Take 1 tablet (10 mg total) by mouth daily as needed.  Marland Kitchen FLUoxetine (PROZAC) 20 MG capsule Take 1 capsule (20 mg total) by mouth daily.  . [DISCONTINUED] amphetamine-dextroamphetamine (ADDERALL XR) 20 MG 24 hr capsule Take 1 capsule (20 mg total) by mouth every morning.  . [DISCONTINUED] amphetamine-dextroamphetamine (ADDERALL) 10 MG tablet Take 1 tablet (10 mg total) by mouth daily as needed.   No  facility-administered encounter medications on file as of 10/22/2020.    Surgical History: Past Surgical History:  Procedure Laterality Date  . AUGMENTATION MAMMAPLASTY Bilateral 2005  . gist tumor remooval    . OVARIAN CYST REMOVAL Left   . TUBAL LIGATION      Medical History: Past Medical History:  Diagnosis Date  . GIST (gastrointestinal stromal tumor), non-malignant   . Hypothyroid   . Tumor     Family History: Family History  Problem Relation Age of Onset  . Diabetes Mother   . Coronary artery disease Mother   . Rectal cancer Paternal Aunt   . Cancer Paternal Aunt   . Cirrhosis Father   . Cancer Maternal Uncle     Social History   Socioeconomic History  . Marital status: Married    Spouse name: Not on file  . Number of children: Not on file  . Years of education: Not on file  . Highest education level: Not on file  Occupational History  . Not on file  Tobacco Use  . Smoking status: Former Research scientist (life sciences)  . Smokeless tobacco: Never Used  Vaping Use  . Vaping Use: Never used  Substance and Sexual Activity  . Alcohol use: Yes    Comment: occasionally  . Drug use: Never  . Sexual activity: Not on file  Other Topics Concern  . Not on file  Social History Narrative  . Not on file   Social Determinants of Health   Financial Resource Strain: Not on file  Food Insecurity: Not on file  Transportation Needs: Not on file  Physical Activity: Not on file  Stress: Not on file  Social Connections: Not on file  Intimate Partner Violence: Not on file      Review of Systems  Constitutional: Negative for chills, diaphoresis and fatigue.  HENT: Positive for postnasal drip, rhinorrhea and sneezing. Negative for ear pain and sinus pressure.   Eyes: Positive for discharge and itching. Negative for photophobia, redness and visual disturbance.  Respiratory: Negative for cough, shortness of breath and wheezing.   Cardiovascular: Negative for chest pain, palpitations and leg  swelling.  Gastrointestinal: Negative for abdominal pain, constipation, diarrhea, nausea and vomiting.  Genitourinary: Negative for dysuria and flank pain.  Musculoskeletal: Negative for arthralgias, back pain, gait problem and neck pain.  Skin: Negative for color change.  Allergic/Immunologic: Positive for environmental allergies. Negative for food allergies.  Neurological: Negative for dizziness and headaches.  Hematological: Does not bruise/bleed easily.  Psychiatric/Behavioral: Negative for agitation, behavioral problems (depression) and hallucinations.    Vital Signs: BP 120/80   Pulse 84   Temp (!) 97.5 F (36.4 C)   Resp 16   Ht 5\' 3"  (1.6 m)   Wt 131 lb 9.6 oz (59.7 kg)   SpO2 99%   BMI 23.31 kg/m    Physical Exam Vitals reviewed.  Constitutional:      Appearance: Normal appearance. She is normal weight.  HENT:     Nose: Rhinorrhea present.     Mouth/Throat:     Mouth: Mucous membranes are moist.  Pharynx: Oropharynx is clear.  Cardiovascular:     Rate and Rhythm: Normal rate and regular rhythm.     Pulses: Normal pulses.     Heart sounds: Normal heart sounds.  Pulmonary:     Effort: Pulmonary effort is normal.     Breath sounds: Normal breath sounds.  Abdominal:     General: Abdomen is flat.     Palpations: Abdomen is soft.  Musculoskeletal:        General: Normal range of motion.     Cervical back: Normal range of motion.  Skin:    General: Skin is warm.  Neurological:     General: No focal deficit present.     Mental Status: She is alert and oriented to person, place, and time. Mental status is at baseline.  Psychiatric:        Mood and Affect: Mood normal.        Behavior: Behavior normal.        Thought Content: Thought content normal.        Judgment: Judgment normal.    Assessment/Plan: 1. Encounter for screening mammogram for malignant neoplasm of breast - MM Digital Screening; Future  2. Adult attention deficit disorder Continue with  current dosing regimen, will discuss weaning off once schooling is completed - amphetamine-dextroamphetamine (ADDERALL XR) 20 MG 24 hr capsule; Take 1 capsule (20 mg total) by mouth every morning.  Dispense: 30 capsule; Refill: 0 - amphetamine-dextroamphetamine (ADDERALL) 10 MG tablet; Take 1 tablet (10 mg total) by mouth daily as needed.  Dispense: 30 tablet; Refill: 0  3. Generalized anxiety disorder Symptoms remain well controlled, requesting refills [place] Controlled Substance Database was reviewed by me for overdose risk score (ORS) - FLUoxetine (PROZAC) 20 MG capsule; Take 1 capsule (20 mg total) by mouth daily.  Dispense: 90 capsule; Refill: 1 - ALPRAZolam (XANAX) 0.5 MG tablet; Take 1-2 tablets as needed at bedtime.  Dispense: 60 tablet; Refill: 0  4. Chronic rhinosinusitis Continue with Zyrtec Add Singulair as well as Astelin Discussed allergy testing--once completed with BSN she would like to consider this - azelastine (ASTELIN) 0.1 % nasal spray; Place 2 sprays into both nostrils 2 (two) times daily. Use in each nostril as directed  Dispense: 30 mL; Refill: 12 - montelukast (SINGULAIR) 10 MG tablet; Take 1 tablet (10 mg total) by mouth at bedtime.  Dispense: 30 tablet; Refill: 3  5. Other fatigue - CBC w/Diff/Platelet - Comprehensive Metabolic Panel (CMET) - Lipid Panel With LDL/HDL Ratio - TSH + free T4 - Vitamin D (25 hydroxy) - B12  General Counseling: Zema verbalizes understanding of the findings of todays visit and agrees with plan of treatment. I have discussed any further diagnostic evaluation that may be needed or ordered today. We also reviewed her medications today. she has been encouraged to call the office with any questions or concerns that should arise related to todays visit.    Orders Placed This Encounter  Procedures  . MM Digital Screening  . CBC w/Diff/Platelet  . Comprehensive Metabolic Panel (CMET)  . Lipid Panel With LDL/HDL Ratio  . TSH + free T4  .  Vitamin D (25 hydroxy)  . B12    Meds ordered this encounter  Medications  . azelastine (ASTELIN) 0.1 % nasal spray    Sig: Place 2 sprays into both nostrils 2 (two) times daily. Use in each nostril as directed    Dispense:  30 mL    Refill:  12  . montelukast (SINGULAIR) 10  MG tablet    Sig: Take 1 tablet (10 mg total) by mouth at bedtime.    Dispense:  30 tablet    Refill:  3  . FLUoxetine (PROZAC) 20 MG capsule    Sig: Take 1 capsule (20 mg total) by mouth daily.    Dispense:  90 capsule    Refill:  1    Please do not send to mail order pharmacy  . DISCONTD: amphetamine-dextroamphetamine (ADDERALL XR) 20 MG 24 hr capsule    Sig: Take 1 capsule (20 mg total) by mouth every morning.    Dispense:  30 capsule    Refill:  0  . DISCONTD: amphetamine-dextroamphetamine (ADDERALL) 10 MG tablet    Sig: Take 1 tablet (10 mg total) by mouth daily as needed.    Dispense:  30 tablet    Refill:  0  . amphetamine-dextroamphetamine (ADDERALL XR) 20 MG 24 hr capsule    Sig: Take 1 capsule (20 mg total) by mouth every morning.    Dispense:  30 capsule    Refill:  0    Do not fill prior to 3/25  . amphetamine-dextroamphetamine (ADDERALL) 10 MG tablet    Sig: Take 1 tablet (10 mg total) by mouth daily as needed.    Dispense:  30 tablet    Refill:  0    Do not fill prior to 3/25  . ALPRAZolam (XANAX) 0.5 MG tablet    Sig: Take 1-2 tablets as needed at bedtime.    Dispense:  60 tablet    Refill:  0    Time spent: 30 Minutes Time spent includes review of chart, medications, test results and follow-up plan with the patient.  This patient was seen by Theodoro Grist AGNP-C in Collaboration with Dr Lavera Guise as a part of collaborative care agreement     Tanna Furry. Chinedu Agustin AGNP-C Internal medicine

## 2020-10-23 LAB — CBC WITH DIFFERENTIAL/PLATELET
Basophils Absolute: 0 10*3/uL (ref 0.0–0.2)
Basos: 0 %
EOS (ABSOLUTE): 0 10*3/uL (ref 0.0–0.4)
Eos: 1 %
Hematocrit: 42.2 % (ref 34.0–46.6)
Hemoglobin: 14.1 g/dL (ref 11.1–15.9)
Immature Grans (Abs): 0 10*3/uL (ref 0.0–0.1)
Immature Granulocytes: 1 %
Lymphocytes Absolute: 1.6 10*3/uL (ref 0.7–3.1)
Lymphs: 25 %
MCH: 31.7 pg (ref 26.6–33.0)
MCHC: 33.4 g/dL (ref 31.5–35.7)
MCV: 95 fL (ref 79–97)
Monocytes Absolute: 0.4 10*3/uL (ref 0.1–0.9)
Monocytes: 6 %
Neutrophils Absolute: 4.5 10*3/uL (ref 1.4–7.0)
Neutrophils: 67 %
Platelets: 319 10*3/uL (ref 150–450)
RBC: 4.45 x10E6/uL (ref 3.77–5.28)
RDW: 13 % (ref 11.7–15.4)
WBC: 6.6 10*3/uL (ref 3.4–10.8)

## 2020-10-23 LAB — COMPREHENSIVE METABOLIC PANEL
ALT: 24 IU/L (ref 0–32)
AST: 20 IU/L (ref 0–40)
Albumin/Globulin Ratio: 2.1 (ref 1.2–2.2)
Albumin: 5 g/dL — ABNORMAL HIGH (ref 3.8–4.9)
Alkaline Phosphatase: 71 IU/L (ref 44–121)
BUN/Creatinine Ratio: 18 (ref 9–23)
BUN: 12 mg/dL (ref 6–24)
Bilirubin Total: 0.5 mg/dL (ref 0.0–1.2)
CO2: 22 mmol/L (ref 20–29)
Calcium: 9.8 mg/dL (ref 8.7–10.2)
Chloride: 100 mmol/L (ref 96–106)
Creatinine, Ser: 0.67 mg/dL (ref 0.57–1.00)
GFR calc Af Amer: 115 mL/min/{1.73_m2} (ref >59)
GFR calc non Af Amer: 100 mL/min/{1.73_m2} (ref >59)
Globulin, Total: 2.4 g/dL (ref 1.5–4.5)
Glucose: 95 mg/dL (ref 65–99)
Potassium: 4.2 mmol/L (ref 3.5–5.2)
Sodium: 139 mmol/L (ref 134–144)
Total Protein: 7.4 g/dL (ref 6.0–8.5)

## 2020-10-23 LAB — LIPID PANEL WITH LDL/HDL RATIO
Cholesterol, Total: 275 mg/dL — ABNORMAL HIGH (ref 100–199)
HDL: 61 mg/dL (ref >39)
LDL Chol Calc (NIH): 183 mg/dL — ABNORMAL HIGH (ref 0–99)
LDL/HDL Ratio: 3 ratio (ref 0.0–3.2)
Triglycerides: 167 mg/dL — ABNORMAL HIGH (ref 0–149)
VLDL Cholesterol Cal: 31 mg/dL (ref 5–40)

## 2020-10-23 LAB — TSH+FREE T4
Free T4: 1.61 ng/dL (ref 0.82–1.77)
TSH: 1.31 u[IU]/mL (ref 0.450–4.500)

## 2020-10-23 LAB — VITAMIN D 25 HYDROXY (VIT D DEFICIENCY, FRACTURES): Vit D, 25-Hydroxy: 36.7 ng/mL (ref 30.0–100.0)

## 2020-10-23 LAB — VITAMIN B12: Vitamin B-12: 440 pg/mL (ref 232–1245)

## 2020-10-23 NOTE — Progress Notes (Signed)
Please call and let her know that I have reviewed her labs. Her lipid panel is abnormal--can we send her healthy diet plan to her through mail? She needs to avoid fried, fatty foods.

## 2020-10-25 ENCOUNTER — Encounter: Payer: Self-pay | Admitting: Hospice and Palliative Medicine

## 2020-10-28 ENCOUNTER — Telehealth: Payer: Self-pay

## 2020-10-28 NOTE — Telephone Encounter (Signed)
Spoke to pt and informed her of lab results and mailed her prudent diet plan

## 2020-10-28 NOTE — Telephone Encounter (Signed)
Informed of labs

## 2020-11-02 ENCOUNTER — Other Ambulatory Visit: Payer: Self-pay | Admitting: Nurse Practitioner

## 2020-11-02 DIAGNOSIS — K219 Gastro-esophageal reflux disease without esophagitis: Secondary | ICD-10-CM

## 2020-11-18 ENCOUNTER — Other Ambulatory Visit: Payer: Self-pay

## 2020-11-18 DIAGNOSIS — E039 Hypothyroidism, unspecified: Secondary | ICD-10-CM

## 2020-11-18 MED ORDER — SYNTHROID 112 MCG PO TABS: 112.0000 ug | ORAL_TABLET | Freq: Every day | ORAL | 1 refills | Status: DC

## 2020-11-25 ENCOUNTER — Other Ambulatory Visit: Payer: Self-pay

## 2020-11-25 DIAGNOSIS — E039 Hypothyroidism, unspecified: Secondary | ICD-10-CM

## 2020-11-25 MED ORDER — LEVOTHYROXINE SODIUM 112 MCG PO TABS: 112.0000 ug | ORAL_TABLET | Freq: Every day | ORAL | 0 refills | Status: DC

## 2020-11-25 MED ORDER — LEVOTHYROXINE SODIUM 112 MCG PO TABS: 112.0000 ug | ORAL_TABLET | Freq: Every day | ORAL | 1 refills | Status: DC

## 2020-11-25 NOTE — Telephone Encounter (Signed)
Pt called she ok to send generic for synthroid and also esend to [facility] and 10 days to local phar

## 2020-12-23 ENCOUNTER — Other Ambulatory Visit: Payer: Self-pay

## 2020-12-23 ENCOUNTER — Encounter: Payer: Self-pay | Admitting: Hospice and Palliative Medicine

## 2020-12-23 ENCOUNTER — Ambulatory Visit: Payer: 59 | Admitting: Hospice and Palliative Medicine

## 2020-12-23 VITALS — BP 130/74 | HR 84 | Temp 98.3°F | Resp 16 | Ht 63.0 in | Wt 131.2 lb

## 2020-12-23 DIAGNOSIS — K219 Gastro-esophageal reflux disease without esophagitis: Secondary | ICD-10-CM

## 2020-12-23 DIAGNOSIS — F411 Generalized anxiety disorder: Secondary | ICD-10-CM

## 2020-12-23 DIAGNOSIS — F988 Other specified behavioral and emotional disorders with onset usually occurring in childhood and adolescence: Secondary | ICD-10-CM | POA: Diagnosis not present

## 2020-12-23 DIAGNOSIS — E039 Hypothyroidism, unspecified: Secondary | ICD-10-CM

## 2020-12-23 DIAGNOSIS — Z79899 Other long term (current) drug therapy: Secondary | ICD-10-CM | POA: Diagnosis not present

## 2020-12-23 DIAGNOSIS — R112 Nausea with vomiting, unspecified: Secondary | ICD-10-CM

## 2020-12-23 LAB — POCT URINE DRUG SCREEN
Methylenedioxyamphetamine: NOT DETECTED
POC Amphetamine UR: POSITIVE — AB
POC BENZODIAZEPINES UR: POSITIVE — AB
POC Barbiturate UR: NOT DETECTED
POC Cocaine UR: NOT DETECTED
POC Ecstasy UR: NOT DETECTED
POC Marijuana UR: NOT DETECTED
POC Methadone UR: NOT DETECTED
POC Methamphetamine UR: NOT DETECTED
POC Opiate Ur: NOT DETECTED
POC Oxycodone UR: NOT DETECTED
POC PHENCYCLIDINE UR: NOT DETECTED
POC TRICYCLICS UR: NOT DETECTED

## 2020-12-23 MED ORDER — AMPHETAMINE-DEXTROAMPHET ER 20 MG PO CP24
20.0000 mg | ORAL_CAPSULE | ORAL | 0 refills | Status: DC
Start: 2020-12-23 — End: 2021-02-17

## 2020-12-23 MED ORDER — ALPRAZOLAM 0.5 MG PO TABS: 0.5000 mg | ORAL_TABLET | Freq: Every evening | ORAL | 1 refills | Status: DC | PRN

## 2020-12-23 MED ORDER — AMPHETAMINE-DEXTROAMPHETAMINE 10 MG PO TABS: 10.0000 mg | ORAL_TABLET | Freq: Every day | ORAL | 0 refills | Status: DC | PRN

## 2020-12-23 MED ORDER — ONDANSETRON HCL 4 MG PO TABS: 4.0000 mg | ORAL_TABLET | Freq: Three times a day (TID) | ORAL | 0 refills | Status: DC | PRN

## 2020-12-23 MED ORDER — AMPHETAMINE-DEXTROAMPHET ER 20 MG PO CP24: 20.0000 mg | ORAL_CAPSULE | ORAL | 0 refills | Status: DC

## 2020-12-23 MED ORDER — ESOMEPRAZOLE MAGNESIUM 40 MG PO CPDR: 40.0000 mg | DELAYED_RELEASE_CAPSULE | Freq: Two times a day (BID) | ORAL | 1 refills | Status: DC

## 2020-12-23 NOTE — Progress Notes (Signed)
[facility] [place], [place] [address]  Internal MEDICINE  Office Visit Note  Patient Name: Yesenia Ward  [id number]  [id number]  Date of Service: 12/30/2020  Chief Complaint  Patient presents with  . Follow-up    Med refills    HPI Patient is here for routine follow-up Brand of levothyroxine has changed and is requesting to have labs drawn for monitoring  Continues to take Adderall as needed while she is working and going back to school full-time Has recently dropped her work hours down which has been helpful Denies negative side effects from medication such as chest pain, palpitations or insomnia   Current Medication: Outpatient Encounter Medications as of 12/23/2020  Medication Sig  . amphetamine-dextroamphetamine (ADDERALL XR) 20 MG 24 hr capsule Take 1 capsule (20 mg total) by mouth every morning.  Marland Kitchen acetaminophen (TYLENOL) 500 MG tablet Take 500 mg by mouth every 6 (six) hours as needed.  . ALPRAZolam (XANAX) 0.5 MG tablet Take 1 tablet (0.5 mg total) by mouth at bedtime as needed for anxiety. Take 1-2 tablets as needed at bedtime.  Marland Kitchen amphetamine-dextroamphetamine (ADDERALL XR) 20 MG 24 hr capsule Take 1 capsule (20 mg total) by mouth every morning.  Marland Kitchen amphetamine-dextroamphetamine (ADDERALL) 10 MG tablet Take 1 tablet (10 mg total) by mouth daily as needed.  Marland Kitchen azelastine (ASTELIN) 0.1 % nasal spray Place 2 sprays into both nostrils 2 (two) times daily. Use in each nostril as directed  . ergocalciferol (DRISDOL) 1.25 MG (50000 UT) capsule Take 1 capsule (50,000 Units total) by mouth once a week.  . esomeprazole (NEXIUM) 40 MG capsule Take 1 capsule (40 mg total) by mouth 2 (two) times daily before a meal.  . Estradiol-Progesterone 1-100 MG CAPS Take by mouth.  . Fexofenadine-Pseudoephedrine (ALLEGRA-D 12 HOUR PO) Take by mouth.  Marland Kitchen FLUoxetine (PROZAC) 20 MG capsule Take 1 capsule (20 mg total) by mouth daily.  Marland Kitchen imatinib (GLEEVEC) 100 MG tablet Take  100 mg by mouth 2 (two) times daily. Take with meals and large glass of water.Caution:Chemotherapy  . levothyroxine (SYNTHROID) 112 MCG tablet Take 1 tablet (112 mcg total) by mouth daily before breakfast.  . [DISCONTINUED] ALPRAZolam (XANAX) 0.5 MG tablet Take 1-2 tablets as needed at bedtime.  . [DISCONTINUED] amphetamine-dextroamphetamine (ADDERALL XR) 20 MG 24 hr capsule Take 1 capsule (20 mg total) by mouth every morning.  . [DISCONTINUED] amphetamine-dextroamphetamine (ADDERALL) 10 MG tablet Take 1 tablet (10 mg total) by mouth daily as needed.  . [DISCONTINUED] esomeprazole (NEXIUM) 40 MG capsule Take 1 capsule (40 mg total) by mouth 2 (two) times daily before a meal.  . [DISCONTINUED] montelukast (SINGULAIR) 10 MG tablet Take 1 tablet (10 mg total) by mouth at bedtime.  . [DISCONTINUED] ondansetron (ZOFRAN) 4 MG tablet Take 1 tablet (4 mg total) by mouth every 8 (eight) hours as needed for nausea or vomiting.  . [DISCONTINUED] ondansetron (ZOFRAN) 4 MG tablet Take 1 tablet (4 mg total) by mouth every 8 (eight) hours as needed for nausea or vomiting.   No facility-administered encounter medications on file as of 12/23/2020.    Surgical History: Past Surgical History:  Procedure Laterality Date  . AUGMENTATION MAMMAPLASTY Bilateral 2005  . gist tumor remooval    . OVARIAN CYST REMOVAL Left   . TUBAL LIGATION      Medical History: Past Medical History:  Diagnosis Date  . GIST (gastrointestinal stromal tumor), non-malignant   . Hypothyroid   . Tumor     Family  History: Family History  Problem Relation Age of Onset  . Diabetes Mother   . Coronary artery disease Mother   . Rectal cancer Paternal Aunt   . Cancer Paternal Aunt   . Cirrhosis Father   . Cancer Maternal Uncle     Social History   Socioeconomic History  . Marital status: Married    Spouse name: Not on file  . Number of children: Not on file  . Years of education: Not on file  . Highest education level: Not  on file  Occupational History  . Not on file  Tobacco Use  . Smoking status: Former Research scientist (life sciences)  . Smokeless tobacco: Never Used  Vaping Use  . Vaping Use: Never used  Substance and Sexual Activity  . Alcohol use: Yes    Comment: occasionally  . Drug use: Never  . Sexual activity: Not on file  Other Topics Concern  . Not on file  Social History Narrative  . Not on file   Social Determinants of Health   Financial Resource Strain: Not on file  Food Insecurity: Not on file  Transportation Needs: Not on file  Physical Activity: Not on file  Stress: Not on file  Social Connections: Not on file  Intimate Partner Violence: Not on file      Review of Systems  Constitutional: Negative for chills, diaphoresis and fatigue.  HENT: Negative for ear pain, postnasal drip and sinus pressure.   Eyes: Negative for photophobia, discharge, redness, itching and visual disturbance.  Respiratory: Negative for cough, shortness of breath and wheezing.   Cardiovascular: Negative for chest pain, palpitations and leg swelling.  Gastrointestinal: Negative for abdominal pain, constipation, diarrhea, nausea and vomiting.  Genitourinary: Negative for dysuria and flank pain.  Musculoskeletal: Negative for arthralgias, back pain, gait problem and neck pain.  Skin: Negative for color change.  Allergic/Immunologic: Negative for environmental allergies and food allergies.  Neurological: Negative for dizziness and headaches.  Hematological: Does not bruise/bleed easily.  Psychiatric/Behavioral: Negative for agitation, behavioral problems (depression) and hallucinations.    Vital Signs: BP 130/74   Pulse 84   Temp 98.3 F (36.8 C)   Resp 16   Ht 5\' 3"  (1.6 m)   Wt 131 lb 3.2 oz (59.5 kg)   SpO2 99%   BMI 23.24 kg/m    Physical Exam Vitals reviewed.  Constitutional:      Appearance: Normal appearance. She is normal weight.  Cardiovascular:     Rate and Rhythm: Normal rate and regular rhythm.      Pulses: Normal pulses.     Heart sounds: Normal heart sounds.  Pulmonary:     Effort: Pulmonary effort is normal.     Breath sounds: Normal breath sounds.  Abdominal:     General: Abdomen is flat.     Palpations: Abdomen is soft.  Musculoskeletal:        General: Normal range of motion.     Cervical back: Normal range of motion.  Skin:    General: Skin is warm.  Neurological:     General: No focal deficit present.     Mental Status: She is alert and oriented to person, place, and time. Mental status is at baseline.  Psychiatric:        Mood and Affect: Mood normal.        Behavior: Behavior normal.        Thought Content: Thought content normal.        Judgment: Judgment normal.    Assessment/Plan:  1. Acquired hypothyroidism Update labs and adjust dosing as indicated - TSH + free T4  2. Gastroesophageal reflux disease without esophagitis Symptoms remain well controlled, requesting refills - esomeprazole (NEXIUM) 40 MG capsule; Take 1 capsule (40 mg total) by mouth 2 (two) times daily before a meal.  Dispense: 180 capsule; Refill: 1  3. Adult attention deficit disorder May continue with current dosing of Adderall as needed [place] Controlled Substance Database was reviewed by me for overdose risk score (ORS) - amphetamine-dextroamphetamine (ADDERALL XR) 20 MG 24 hr capsule; Take 1 capsule (20 mg total) by mouth every morning.  Dispense: 30 capsule; Refill: 0 - amphetamine-dextroamphetamine (ADDERALL) 10 MG tablet; Take 1 tablet (10 mg total) by mouth daily as needed.  Dispense: 30 tablet; Refill: 0 - amphetamine-dextroamphetamine (ADDERALL XR) 20 MG 24 hr capsule; Take 1 capsule (20 mg total) by mouth every morning.  Dispense: 30 capsule; Refill: 0  4. Generalized anxiety disorder May continue with alprazolam as needed, will need to discuss weaning dosing Reviewed risks and possible side effects associated with taking opiates, benzodiazepines and other CNS depressants. Combination  of these could cause dizziness and drowsiness. Advised patient not to drive or operate machinery when taking these medications, as patient's and other's life can be at risk and will have consequences. Patient verbalized understanding in this matter. Dependence and abuse for these drugs will be monitored closely. A Controlled substance policy and procedure is on file which allows [facility] to order a urine drug screen test at any visit. Patient understands and agrees with the plan - ALPRAZolam (XANAX) 0.5 MG tablet; Take 1 tablet (0.5 mg total) by mouth at bedtime as needed for anxiety. Take 1-2 tablets as needed at bedtime.  Dispense: 30 tablet; Refill: 1  5. Encounter for long-term (current) use of medications - POCT Urine Drug Screen  General Counseling: Yesenia Ward verbalizes understanding of the findings of todays visit and agrees with plan of treatment. I have discussed any further diagnostic evaluation that may be needed or ordered today. We also reviewed her medications today. she has been encouraged to call the office with any questions or concerns that should arise related to todays visit.    Orders Placed This Encounter  Procedures  . TSH + free T4  . POCT Urine Drug Screen    Meds ordered this encounter  Medications  . amphetamine-dextroamphetamine (ADDERALL XR) 20 MG 24 hr capsule    Sig: Take 1 capsule (20 mg total) by mouth every morning.    Dispense:  30 capsule    Refill:  0  . amphetamine-dextroamphetamine (ADDERALL) 10 MG tablet    Sig: Take 1 tablet (10 mg total) by mouth daily as needed.    Dispense:  30 tablet    Refill:  0  . ALPRAZolam (XANAX) 0.5 MG tablet    Sig: Take 1 tablet (0.5 mg total) by mouth at bedtime as needed for anxiety. Take 1-2 tablets as needed at bedtime.    Dispense:  30 tablet    Refill:  1  . amphetamine-dextroamphetamine (ADDERALL XR) 20 MG 24 hr capsule    Sig: Take 1 capsule (20 mg total) by mouth every morning.    Dispense:  30  capsule    Refill:  0    Do not fill prior to 5/27  . esomeprazole (NEXIUM) 40 MG capsule    Sig: Take 1 capsule (40 mg total) by mouth 2 (two) times daily before a meal.    Dispense:  180  capsule    Refill:  1  . DISCONTD: ondansetron (ZOFRAN) 4 MG tablet    Sig: Take 1 tablet (4 mg total) by mouth every 8 (eight) hours as needed for nausea or vomiting.    Dispense:  90 tablet    Refill:  0    Time spent: 30 Minutes Time spent includes review of chart, medications, test results and follow-up plan with the patient.  This patient was seen by Theodoro Grist AGNP-C in Collaboration with Dr Lavera Guise as a part of collaborative care agreement     Tanna Furry. Lillia Lengel AGNP-C Internal medicine

## 2020-12-24 ENCOUNTER — Other Ambulatory Visit: Payer: Self-pay

## 2020-12-24 ENCOUNTER — Encounter: Payer: Self-pay | Admitting: Hospice and Palliative Medicine

## 2020-12-24 DIAGNOSIS — R112 Nausea with vomiting, unspecified: Secondary | ICD-10-CM

## 2020-12-24 MED ORDER — ONDANSETRON HCL 4 MG PO TABS: 4.0000 mg | ORAL_TABLET | Freq: Three times a day (TID) | ORAL | 3 refills | Status: DC | PRN

## 2020-12-30 ENCOUNTER — Encounter: Payer: Self-pay | Admitting: Hospice and Palliative Medicine

## 2021-01-23 ENCOUNTER — Other Ambulatory Visit: Payer: Self-pay

## 2021-01-23 DIAGNOSIS — J329 Chronic sinusitis, unspecified: Secondary | ICD-10-CM

## 2021-01-23 MED ORDER — AZELASTINE HCL 0.1 % NA SOLN: 2.0000 | Freq: Two times a day (BID) | NASAL | 3 refills | Status: DC

## 2021-02-08 ENCOUNTER — Other Ambulatory Visit: Payer: Self-pay

## 2021-02-08 DIAGNOSIS — E039 Hypothyroidism, unspecified: Secondary | ICD-10-CM

## 2021-02-08 MED ORDER — LEVOTHYROXINE SODIUM 112 MCG PO TABS: 112.0000 ug | ORAL_TABLET | Freq: Every day | ORAL | 0 refills | Status: DC

## 2021-02-16 ENCOUNTER — Telehealth: Payer: Self-pay

## 2021-02-16 NOTE — Telephone Encounter (Signed)
Left vm to screen for 02/17/21 appointment-Toni 

## 2021-02-17 ENCOUNTER — Ambulatory Visit: Payer: Self-pay | Admitting: Nurse Practitioner

## 2021-02-17 ENCOUNTER — Encounter: Payer: Self-pay | Admitting: Nurse Practitioner

## 2021-02-17 ENCOUNTER — Other Ambulatory Visit: Payer: Self-pay

## 2021-02-17 VITALS — BP 138/88 | HR 83 | Temp 98.0°F | Resp 16 | Ht 63.0 in | Wt 131.2 lb

## 2021-02-17 DIAGNOSIS — F5102 Adjustment insomnia: Secondary | ICD-10-CM | POA: Diagnosis not present

## 2021-02-17 DIAGNOSIS — F988 Other specified behavioral and emotional disorders with onset usually occurring in childhood and adolescence: Secondary | ICD-10-CM

## 2021-02-17 DIAGNOSIS — F411 Generalized anxiety disorder: Secondary | ICD-10-CM | POA: Diagnosis not present

## 2021-02-17 DIAGNOSIS — Z79899 Other long term (current) drug therapy: Secondary | ICD-10-CM

## 2021-02-17 LAB — POCT URINE DRUG SCREEN
POC Amphetamine UR: POSITIVE — AB
POC BENZODIAZEPINES UR: POSITIVE — AB
POC Barbiturate UR: NOT DETECTED
POC Cocaine UR: NOT DETECTED
POC Ecstasy UR: NOT DETECTED
POC Marijuana UR: NOT DETECTED
POC Methadone UR: NOT DETECTED
POC Methamphetamine UR: NOT DETECTED
POC Opiate Ur: NOT DETECTED
POC Oxycodone UR: NOT DETECTED
POC PHENCYCLIDINE UR: NOT DETECTED
POC TRICYCLICS UR: NOT DETECTED

## 2021-02-17 MED ORDER — AMPHETAMINE-DEXTROAMPHET ER 20 MG PO CP24: 20.0000 mg | ORAL_CAPSULE | ORAL | 0 refills | Status: DC

## 2021-02-17 MED ORDER — AMPHETAMINE-DEXTROAMPHET ER 20 MG PO CP24
20.0000 mg | ORAL_CAPSULE | ORAL | 0 refills | Status: DC
Start: 2021-04-14 — End: 2021-05-12

## 2021-02-17 MED ORDER — ALPRAZOLAM 0.5 MG PO TABS
0.5000 mg | ORAL_TABLET | Freq: Every evening | ORAL | 2 refills | Status: DC | PRN
Start: 2021-02-17 — End: 2021-06-04

## 2021-02-17 MED ORDER — AMPHETAMINE-DEXTROAMPHETAMINE 10 MG PO TABS: 10.0000 mg | ORAL_TABLET | Freq: Every day | ORAL | 0 refills | Status: DC | PRN

## 2021-02-17 NOTE — Progress Notes (Signed)
[facility] [place], [place] [address]  Internal MEDICINE  Office Visit Note  Patient Name: Yesenia Ward  [id number]  [id number]  Date of Service: 02/26/2021  Chief Complaint  Patient presents with   Follow-up         HPI Corayma presents for a follow up visit for medication refills. She has a history of hypothyroidism, ADHD, and a nonmalignant gastrointestinal stromal tumor. The tumor was surgically removed in 2014. She has had no issues since then but remains on preventive medication called imatinib.  She takes adderall XR for ADHD and states it is working well. She also takes alprazolam for anxiety and insomnia and is requesting a refill of that too.  She denies any adverse side effects of the medications. She denies any other questions or concerns today and denies any pain when asked.     Current Medication: Outpatient Encounter Medications as of 02/17/2021  Medication Sig   acetaminophen (TYLENOL) 500 MG tablet Take 500 mg by mouth every 6 (six) hours as needed.   azelastine (ASTELIN) 0.1 % nasal spray Place 2 sprays into both nostrils 2 (two) times daily. Use in each nostril as directed   ergocalciferol (DRISDOL) 1.25 MG (50000 UT) capsule Take 1 capsule (50,000 Units total) by mouth once a week.   esomeprazole (NEXIUM) 40 MG capsule Take 1 capsule (40 mg total) by mouth 2 (two) times daily before a meal.   Estradiol-Progesterone 1-100 MG CAPS Take by mouth.   Fexofenadine-Pseudoephedrine (ALLEGRA-D 12 HOUR PO) Take by mouth.   FLUoxetine (PROZAC) 20 MG capsule Take 1 capsule (20 mg total) by mouth daily.   imatinib (GLEEVEC) 100 MG tablet Take 100 mg by mouth 2 (two) times daily. Take with meals and large glass of water.Caution:Chemotherapy   ondansetron (ZOFRAN) 4 MG tablet Take 1 tablet (4 mg total) by mouth every 8 (eight) hours as needed for nausea or vomiting.   [DISCONTINUED] ALPRAZolam (XANAX) 0.5 MG tablet Take 1 tablet (0.5 mg total) by mouth  at bedtime as needed for anxiety. Take 1-2 tablets as needed at bedtime.   [DISCONTINUED] amphetamine-dextroamphetamine (ADDERALL XR) 20 MG 24 hr capsule Take 1 capsule (20 mg total) by mouth every morning.   [DISCONTINUED] amphetamine-dextroamphetamine (ADDERALL XR) 20 MG 24 hr capsule Take 1 capsule (20 mg total) by mouth every morning.   [DISCONTINUED] amphetamine-dextroamphetamine (ADDERALL) 10 MG tablet Take 1 tablet (10 mg total) by mouth daily as needed.   [DISCONTINUED] levothyroxine (SYNTHROID) 112 MCG tablet Take 1 tablet (112 mcg total) by mouth daily before breakfast.   ALPRAZolam (XANAX) 0.5 MG tablet Take 1-2 tablets (0.5-1 mg total) by mouth at bedtime as needed for anxiety. Take 1-2 tablets as needed at bedtime.   [START ON 04/14/2021] amphetamine-dextroamphetamine (ADDERALL XR) 20 MG 24 hr capsule Take 1 capsule (20 mg total) by mouth every morning.   [START ON 03/17/2021] amphetamine-dextroamphetamine (ADDERALL XR) 20 MG 24 hr capsule Take 1 capsule (20 mg total) by mouth every morning.   amphetamine-dextroamphetamine (ADDERALL XR) 20 MG 24 hr capsule Take 1 capsule (20 mg total) by mouth every morning.   amphetamine-dextroamphetamine (ADDERALL) 10 MG tablet Take 1 tablet (10 mg total) by mouth daily as needed.   No facility-administered encounter medications on file as of 02/17/2021.    Surgical History: Past Surgical History:  Procedure Laterality Date   AUGMENTATION MAMMAPLASTY Bilateral 2005   gist tumor remooval     OVARIAN CYST REMOVAL Left    TUBAL LIGATION  Medical History: Past Medical History:  Diagnosis Date   GIST (gastrointestinal stromal tumor), non-malignant    Hypothyroid    Tumor     Family History: Family History  Problem Relation Age of Onset   Diabetes Mother    Coronary artery disease Mother    Cirrhosis Father    Cancer Maternal Uncle    Rectal cancer Paternal Aunt    Cancer Paternal Aunt     Social History   Socioeconomic History    Marital status: Married    Spouse name: Not on file   Number of children: Not on file   Years of education: Not on file   Highest education level: Not on file  Occupational History   Not on file  Tobacco Use   Smoking status: Former    Pack years: 0.00   Smokeless tobacco: Never  Vaping Use   Vaping Use: Never used  Substance and Sexual Activity   Alcohol use: Yes    Comment: occasionally   Drug use: Never   Sexual activity: Not on file  Other Topics Concern   Not on file  Social History Narrative   Not on file   Social Determinants of Health   Financial Resource Strain: Not on file  Food Insecurity: Not on file  Transportation Needs: Not on file  Physical Activity: Not on file  Stress: Not on file  Social Connections: Not on file  Intimate Partner Violence: Not on file      Review of Systems  Constitutional:  Negative for chills, fatigue and unexpected weight change.  HENT:  Negative for congestion, rhinorrhea, sneezing and sore throat.   Eyes:  Negative for redness.  Respiratory:  Negative for cough, chest tightness and shortness of breath.   Cardiovascular:  Negative for chest pain and palpitations.  Gastrointestinal:  Negative for abdominal pain, constipation, diarrhea, nausea and vomiting.  Genitourinary:  Negative for dysuria and frequency.  Musculoskeletal:  Negative for arthralgias, back pain, joint swelling and neck pain.  Skin:  Negative for rash.  Neurological: Negative.  Negative for tremors and numbness.  Hematological:  Negative for adenopathy. Does not bruise/bleed easily.  Psychiatric/Behavioral:  Negative for behavioral problems (Depression), sleep disturbance and suicidal ideas. The patient is not nervous/anxious.    Vital Signs: BP 138/88   Pulse 83   Temp 98 F (36.7 C)   Resp 16   Ht 5\' 3"  (1.6 m)   Wt 131 lb 3.2 oz (59.5 kg)   SpO2 96%   BMI 23.24 kg/m    Physical Exam Vitals reviewed.  Constitutional:      General: She is not  in acute distress.    Appearance: Normal appearance. She is normal weight. She is not ill-appearing.  HENT:     Head: Normocephalic and atraumatic.  Cardiovascular:     Rate and Rhythm: Normal rate and regular rhythm.     Pulses: Normal pulses.     Heart sounds: Normal heart sounds.  Pulmonary:     Effort: Pulmonary effort is normal.     Breath sounds: Normal breath sounds.  Skin:    General: Skin is warm and dry.     Capillary Refill: Capillary refill takes less than 2 seconds.  Neurological:     Mental Status: She is alert and oriented to person, place, and time.  Psychiatric:        Mood and Affect: Mood normal.        Behavior: Behavior normal.    Assessment/Plan: 1.  Generalized anxiety disorder Stable, Alprazolam refill ordered.  - ALPRAZolam (XANAX) 0.5 MG tablet; Take 1-2 tablets (0.5-1 mg total) by mouth at bedtime as needed for anxiety. Take 1-2 tablets as needed at bedtime.  Dispense: 60 tablet; Refill: 2  2. Adult attention deficit disorder Stable, refills ordered, follow up in 3 months - amphetamine-dextroamphetamine (ADDERALL) 10 MG tablet; Take 1 tablet (10 mg total) by mouth daily as needed.  Dispense: 30 tablet; Refill: 0 - amphetamine-dextroamphetamine (ADDERALL XR) 20 MG 24 hr capsule; Take 1 capsule (20 mg total) by mouth every morning.  Dispense: 30 capsule; Refill: 0 - amphetamine-dextroamphetamine (ADDERALL XR) 20 MG 24 hr capsule; Take 1 capsule (20 mg total) by mouth every morning.  Dispense: 30 capsule; Refill: 0 - amphetamine-dextroamphetamine (ADDERALL XR) 20 MG 24 hr capsule; Take 1 capsule (20 mg total) by mouth every morning.  Dispense: 30 capsule; Refill: 0  3. Adjustment insomnia Takes alprazolam for insomnia as well.  - ALPRAZolam (XANAX) 0.5 MG tablet; Take 1-2 tablets (0.5-1 mg total) by mouth at bedtime as needed for anxiety. Take 1-2 tablets as needed at bedtime.  Dispense: 60 tablet; Refill: 2  4. Encounter for long-term (current) use of  medications Urine drug screen done, consistent with adderall and alprazolam prescriptions. Repeat in 3 months. - POCT Urine Drug Screen   General Counseling: Nikoletta verbalizes understanding of the findings of todays visit and agrees with plan of treatment. I have discussed any further diagnostic evaluation that may be needed or ordered today. We also reviewed her medications today. she has been encouraged to call the office with any questions or concerns that should arise related to todays visit.    Orders Placed This Encounter  Procedures   POCT Urine Drug Screen    Meds ordered this encounter  Medications   ALPRAZolam (XANAX) 0.5 MG tablet    Sig: Take 1-2 tablets (0.5-1 mg total) by mouth at bedtime as needed for anxiety. Take 1-2 tablets as needed at bedtime.    Dispense:  60 tablet    Refill:  2   amphetamine-dextroamphetamine (ADDERALL) 10 MG tablet    Sig: Take 1 tablet (10 mg total) by mouth daily as needed.    Dispense:  30 tablet    Refill:  0   amphetamine-dextroamphetamine (ADDERALL XR) 20 MG 24 hr capsule    Sig: Take 1 capsule (20 mg total) by mouth every morning.    Dispense:  30 capsule    Refill:  0    Please fill on 04/14/21   amphetamine-dextroamphetamine (ADDERALL XR) 20 MG 24 hr capsule    Sig: Take 1 capsule (20 mg total) by mouth every morning.    Dispense:  30 capsule    Refill:  0    Please fill July 20,2022   amphetamine-dextroamphetamine (ADDERALL XR) 20 MG 24 hr capsule    Sig: Take 1 capsule (20 mg total) by mouth every morning.    Dispense:  30 capsule    Refill:  0    June 2022    Return in about 3 months (around 05/20/2021) for CPE and , F/U, ADHD med check, anxiety med refill and UDS, Candace Ramus PCP.   Total time spent:30 Minutes Time spent includes review of chart, medications, test results, and follow up plan with the patient.   [place] Controlled Substance Database was reviewed by me.  This patient was seen by Jonetta Osgood, FNP-C in  collaboration with Dr. Clayborn Bigness as a part of collaborative care agreement.  Daneille Desilva R. Valetta Fuller, MSN, FNP-C Internal medicine

## 2021-02-18 ENCOUNTER — Ambulatory Visit: Payer: Self-pay | Admitting: Physician Assistant

## 2021-02-19 ENCOUNTER — Other Ambulatory Visit: Payer: Self-pay | Admitting: Nurse Practitioner

## 2021-02-19 DIAGNOSIS — Z1231 Encounter for screening mammogram for malignant neoplasm of breast: Secondary | ICD-10-CM

## 2021-02-20 LAB — CBC WITH DIFFERENTIAL/PLATELET
Basophils Absolute: 0.1 10*3/uL (ref 0.0–0.2)
Basos: 1 %
EOS (ABSOLUTE): 0.1 10*3/uL (ref 0.0–0.4)
Eos: 1 %
Hematocrit: 40.9 % (ref 34.0–46.6)
Hemoglobin: 13.9 g/dL (ref 11.1–15.9)
Immature Grans (Abs): 0 10*3/uL (ref 0.0–0.1)
Immature Granulocytes: 1 %
Lymphocytes Absolute: 2.1 10*3/uL (ref 0.7–3.1)
Lymphs: 35 %
MCH: 32.2 pg (ref 26.6–33.0)
MCHC: 34 g/dL (ref 31.5–35.7)
MCV: 95 fL (ref 79–97)
Monocytes Absolute: 0.5 10*3/uL (ref 0.1–0.9)
Monocytes: 8 %
Neutrophils Absolute: 3.4 10*3/uL (ref 1.4–7.0)
Neutrophils: 54 %
Platelets: 348 10*3/uL (ref 150–450)
RBC: 4.32 x10E6/uL (ref 3.77–5.28)
RDW: 12.5 % (ref 11.7–15.4)
WBC: 6.1 10*3/uL (ref 3.4–10.8)

## 2021-02-20 LAB — TSH+FREE T4
Free T4: 1.16 ng/dL (ref 0.82–1.77)
TSH: 7.64 u[IU]/mL — ABNORMAL HIGH (ref 0.450–4.500)

## 2021-02-22 ENCOUNTER — Encounter: Payer: Self-pay | Admitting: Nurse Practitioner

## 2021-02-22 ENCOUNTER — Other Ambulatory Visit: Payer: Self-pay | Admitting: Internal Medicine

## 2021-02-22 DIAGNOSIS — E039 Hypothyroidism, unspecified: Secondary | ICD-10-CM

## 2021-02-22 DIAGNOSIS — L659 Nonscarring hair loss, unspecified: Secondary | ICD-10-CM

## 2021-02-25 ENCOUNTER — Other Ambulatory Visit: Payer: Self-pay | Admitting: Internal Medicine

## 2021-02-25 DIAGNOSIS — E039 Hypothyroidism, unspecified: Secondary | ICD-10-CM

## 2021-02-25 MED ORDER — LEVOTHYROXINE SODIUM 125 MCG PO TABS: 125.0000 ug | ORAL_TABLET | Freq: Every day | ORAL | 3 refills | Status: DC

## 2021-04-01 ENCOUNTER — Other Ambulatory Visit: Payer: Self-pay | Admitting: Nurse Practitioner

## 2021-04-01 DIAGNOSIS — E559 Vitamin D deficiency, unspecified: Secondary | ICD-10-CM

## 2021-04-02 ENCOUNTER — Other Ambulatory Visit: Payer: Self-pay

## 2021-04-02 DIAGNOSIS — R112 Nausea with vomiting, unspecified: Secondary | ICD-10-CM

## 2021-04-02 MED ORDER — ONDANSETRON HCL 4 MG PO TABS: 4.0000 mg | ORAL_TABLET | Freq: Three times a day (TID) | ORAL | 0 refills | Status: DC | PRN

## 2021-04-15 ENCOUNTER — Telehealth: Payer: Self-pay

## 2021-04-15 NOTE — Telephone Encounter (Signed)
Received call from [place] for verbal order. Transferred call to [facility]

## 2021-04-15 NOTE — Telephone Encounter (Signed)
LMOM to have pt call us back due to questions about a prescription for zofran.  [facility] was asking about a 90 day supply.  Asked pt to return my call

## 2021-04-16 ENCOUNTER — Encounter: Payer: Self-pay | Admitting: Internal Medicine

## 2021-04-16 NOTE — Telephone Encounter (Signed)
Pls see this

## 2021-04-23 ENCOUNTER — Other Ambulatory Visit: Payer: Self-pay

## 2021-04-23 DIAGNOSIS — F411 Generalized anxiety disorder: Secondary | ICD-10-CM

## 2021-04-23 MED ORDER — FLUOXETINE HCL 20 MG PO CAPS: 20.0000 mg | ORAL_CAPSULE | Freq: Every day | ORAL | 1 refills | Status: DC

## 2021-05-11 ENCOUNTER — Telehealth: Payer: Self-pay

## 2021-05-11 NOTE — Telephone Encounter (Signed)
Left vm to confirm 05/12/21 appointment-Yesenia Ward

## 2021-05-12 ENCOUNTER — Other Ambulatory Visit: Payer: Self-pay

## 2021-05-12 ENCOUNTER — Ambulatory Visit: Payer: Self-pay | Admitting: Nurse Practitioner

## 2021-05-12 ENCOUNTER — Encounter: Payer: Self-pay | Admitting: Nurse Practitioner

## 2021-05-12 VITALS — BP 125/85 | HR 82 | Temp 98.5°F | Resp 16 | Ht 63.0 in | Wt 132.0 lb

## 2021-05-12 DIAGNOSIS — E559 Vitamin D deficiency, unspecified: Secondary | ICD-10-CM

## 2021-05-12 DIAGNOSIS — R300 Dysuria: Secondary | ICD-10-CM

## 2021-05-12 DIAGNOSIS — Z1231 Encounter for screening mammogram for malignant neoplasm of breast: Secondary | ICD-10-CM

## 2021-05-12 DIAGNOSIS — Z862 Personal history of diseases of the blood and blood-forming organs and certain disorders involving the immune mechanism: Secondary | ICD-10-CM

## 2021-05-12 DIAGNOSIS — F988 Other specified behavioral and emotional disorders with onset usually occurring in childhood and adolescence: Secondary | ICD-10-CM

## 2021-05-12 DIAGNOSIS — D509 Iron deficiency anemia, unspecified: Secondary | ICD-10-CM

## 2021-05-12 DIAGNOSIS — Z0001 Encounter for general adult medical examination with abnormal findings: Secondary | ICD-10-CM

## 2021-05-12 DIAGNOSIS — E039 Hypothyroidism, unspecified: Secondary | ICD-10-CM

## 2021-05-12 DIAGNOSIS — Z79899 Other long term (current) drug therapy: Secondary | ICD-10-CM

## 2021-05-12 DIAGNOSIS — C49A3 Gastrointestinal stromal tumor of small intestine: Secondary | ICD-10-CM

## 2021-05-12 DIAGNOSIS — F411 Generalized anxiety disorder: Secondary | ICD-10-CM

## 2021-05-12 LAB — POCT URINE DRUG SCREEN
POC Amphetamine UR: POSITIVE — AB
POC BENZODIAZEPINES UR: POSITIVE — AB
POC Barbiturate UR: NOT DETECTED
POC Cocaine UR: NOT DETECTED
POC Ecstasy UR: NOT DETECTED
POC Marijuana UR: NOT DETECTED
POC Methadone UR: NOT DETECTED
POC Methamphetamine UR: NOT DETECTED
POC Opiate Ur: NOT DETECTED
POC Oxycodone UR: NOT DETECTED
POC PHENCYCLIDINE UR: NOT DETECTED
POC TRICYCLICS UR: NOT DETECTED

## 2021-05-12 MED ORDER — AMPHETAMINE-DEXTROAMPHET ER 20 MG PO CP24: 20.0000 mg | ORAL_CAPSULE | ORAL | 0 refills | Status: DC

## 2021-05-12 MED ORDER — AMPHETAMINE-DEXTROAMPHETAMINE 10 MG PO TABS: 10.0000 mg | ORAL_TABLET | Freq: Every day | ORAL | 0 refills | Status: DC | PRN

## 2021-05-12 NOTE — Progress Notes (Signed)
[facility] [place], [place] [address]  Internal MEDICINE  Office Visit Note  Patient Name: Yesenia Ward  [id number]  [id number]  Date of Service: 05/12/2021  Chief Complaint  Patient presents with   Annual Exam    Refills   Hypothyroidism    HPI Yesenia Ward presents for an annual well visit and physical exam. 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 She is followed by oncology and is taking Gleevec. She has had 2 doses of the COVID vaccine. She lives at home with her husband and her 25 yo son recently moved in with them temporarily. 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  - last thyroid labs were done in June 2022 and her TSH level was 7.64 and her levothyroxine dose was increased from 112 mcg daily to 125 mcg daily on 02/25/21. She will need labs ordered today.  -She has adult ADD and takes generic adderall XR 20 mg daily and has an additional dose of generic adderall 10 mg that she takes once daily as needed that is not extended release. She gets 1 month of the 10 mg dose filled and it lasts her for 3 months since she does not use the additional dose every day. She is due for UDS today.  She has been taking vitamin D 50,000 units weekly. Her last vitamin D level was checked in February 2022 and it was within normal limits.  -She had her last mammogram in April 2021. The results were BI-RADS 1 negative. She is due for her mammogram this year.  She had her screening colonoscopy done in 2019 and it was normal, she is not due until 2029. She had her tetanus booster in 2020. She is due for her pap smear and will have that done with her OB-GYN in November. She has declined the pneumonia and shingles vaccines for now.     Current Medication: Outpatient Encounter Medications as of 05/12/2021  Medication Sig   acetaminophen (TYLENOL)  500 MG tablet Take 500 mg by mouth every 6 (six) hours as needed.   ALPRAZolam (XANAX) 0.5 MG tablet Take 1-2 tablets (0.5-1 mg total) by mouth at bedtime as needed for anxiety. Take 1-2 tablets as needed at bedtime.   amphetamine-dextroamphetamine (ADDERALL XR) 20 MG 24 hr capsule Take 1 capsule (20 mg total) by mouth every morning.   azelastine (ASTELIN) 0.1 % nasal spray Place 2 sprays into both nostrils 2 (two) times daily. Use in each nostril as directed   ergocalciferol (DRISDOL) 1.25 MG (50000 UT) capsule Take 1 capsule (50,000 Units total) by mouth once a week.   esomeprazole (NEXIUM) 40 MG capsule Take 1 capsule (40 mg total) by mouth 2 (two) times daily before a meal.   Estradiol-Progesterone 1-100 MG CAPS Take by mouth.   Fexofenadine-Pseudoephedrine (ALLEGRA-D 12 HOUR PO) Take by mouth.   imatinib (GLEEVEC) 100 MG tablet Take 100 mg by mouth 2 (two) times daily. Take with meals and large glass of water.Caution:Chemotherapy   levothyroxine (SYNTHROID) 125 MCG tablet Take 1 tablet (125 mcg total) by mouth daily.   ondansetron (ZOFRAN) 4 MG tablet Take 1 tablet (4 mg total) by mouth every 8 (eight) hours as needed for nausea or vomiting.   [DISCONTINUED] amphetamine-dextroamphetamine (ADDERALL XR) 20 MG 24 hr capsule Take 1 capsule (20 mg total) by mouth every morning.   [DISCONTINUED]  amphetamine-dextroamphetamine (ADDERALL XR) 20 MG 24 hr capsule Take 1 capsule (20 mg total) by mouth every morning.   [DISCONTINUED] amphetamine-dextroamphetamine (ADDERALL) 10 MG tablet Take 1 tablet (10 mg total) by mouth daily as needed.   [START ON 06/02/2021] amphetamine-dextroamphetamine (ADDERALL XR) 20 MG 24 hr capsule Take 1 capsule (20 mg total) by mouth every morning.   [START ON 06/30/2021] amphetamine-dextroamphetamine (ADDERALL XR) 20 MG 24 hr capsule Take 1 capsule (20 mg total) by mouth every morning.   [START ON 07/28/2021] amphetamine-dextroamphetamine (ADDERALL XR) 20 MG 24 hr capsule Take 1  capsule (20 mg total) by mouth every morning.   amphetamine-dextroamphetamine (ADDERALL) 10 MG tablet Take 1 tablet (10 mg total) by mouth daily as needed.   [DISCONTINUED] FLUoxetine (PROZAC) 20 MG capsule Take 1 capsule (20 mg total) by mouth daily. (Patient not taking: Reported on 05/12/2021)   No facility-administered encounter medications on file as of 05/12/2021.    Surgical History: Past Surgical History:  Procedure Laterality Date   AUGMENTATION MAMMAPLASTY Bilateral 2005   gist tumor remooval     OVARIAN CYST REMOVAL Left    TUBAL LIGATION      Medical History: Past Medical History:  Diagnosis Date   GIST (gastrointestinal stromal tumor), non-malignant    Hypothyroid    Tumor     Family History: Family History  Problem Relation Age of Onset   Diabetes Mother    Coronary artery disease Mother    Cirrhosis Father    Cancer Maternal Uncle    Rectal cancer Paternal Aunt    Cancer Paternal Aunt     Social History   Socioeconomic History   Marital status: Married    Spouse name: Not on file   Number of children: Not on file   Years of education: Not on file   Highest education level: Not on file  Occupational History   Not on file  Tobacco Use   Smoking status: Former   Smokeless tobacco: Never  Vaping Use   Vaping Use: Never used  Substance and Sexual Activity   Alcohol use: Yes    Comment: occasionally   Drug use: Never   Sexual activity: Not on file  Other Topics Concern   Not on file  Social History Narrative   Not on file   Social Determinants of Health   Financial Resource Strain: Not on file  Food Insecurity: Not on file  Transportation Needs: Not on file  Physical Activity: Not on file  Stress: Not on file  Social Connections: Not on file  Intimate Partner Violence: Not on file      Review of Systems  Constitutional:  Negative for activity change, appetite change, chills, fatigue, fever and unexpected weight change.  HENT: Negative.   Negative for congestion, ear pain, rhinorrhea, sore throat and trouble swallowing.   Eyes: Negative.   Respiratory: Negative.  Negative for cough, chest tightness, shortness of breath and wheezing.   Cardiovascular: Negative.  Negative for chest pain and palpitations.  Gastrointestinal:  Positive for nausea (has prescription for zofran because Gleevec makes her nauseated.). Negative for abdominal pain, blood in stool, constipation, diarrhea and vomiting.       GIST tumor removed and is on [place], gets yearly CT abdomen to assess for new tumors or mets.   Endocrine: Negative.   Genitourinary: Negative.  Negative for difficulty urinating, dysuria, frequency, hematuria and urgency.  Musculoskeletal: Negative.  Negative for arthralgias, back pain, joint swelling, myalgias and neck pain.  Skin: Negative.  Negative for rash and wound.  Allergic/Immunologic: Negative.  Negative for immunocompromised state.  Neurological: Negative.  Negative for dizziness, seizures, light-headedness, numbness and headaches.  Hematological: Negative.   Psychiatric/Behavioral:  Positive for decreased concentration (ADD symptoms controlled with adderall XR) and sleep disturbance (takes alprazolam for sleep as needed.). Negative for behavioral problems, self-injury and suicidal ideas. The patient is nervous/anxious.    Vital Signs: BP (!) 128/94   Pulse 82   Temp 98.5 F (36.9 C)   Resp 16   Ht _0  (1.6 m)   Wt 132 lb (59.9 kg)   SpO2 99%   BMI 23.38 kg/m    Physical Exam Constitutional:      General: She is awake. She is not in acute distress.    Appearance: Normal appearance. She is well-developed, well-groomed and normal weight. She is not ill-appearing or diaphoretic.  HENT:     Head: Normocephalic and atraumatic.     Right Ear: Tympanic membrane, ear canal and external ear normal.     Left Ear: Tympanic membrane, ear canal and external ear normal.     Nose: Nose normal. No congestion or rhinorrhea.      Mouth/Throat:     Lips: Pink.     Mouth: Mucous membranes are moist.     Pharynx: Oropharynx is clear. Uvula midline. No oropharyngeal exudate or posterior oropharyngeal erythema.  Eyes:     General: Lids are normal. Vision grossly intact. Gaze aligned appropriately. No scleral icterus.       Right eye: No discharge.        Left eye: No discharge.     Extraocular Movements: Extraocular movements intact.     Conjunctiva/sclera: Conjunctivae normal.     Pupils: Pupils are equal, round, and reactive to light.     Funduscopic exam:    Right eye: Red reflex present.        Left eye: Red reflex present. Neck:     Thyroid: No thyromegaly.     Vascular: No JVD.     Trachea: Trachea and phonation normal. No tracheal deviation.  Cardiovascular:     Rate and Rhythm: Normal rate and regular rhythm.     Pulses:          Carotid pulses are 3+ on the right side and 3+ on the left side.      Radial pulses are 2+ on the right side and 2+ on the left side.       Posterior tibial pulses are 1+ on the right side and 1+ on the left side.     Heart sounds: Normal heart sounds, S1 normal and S2 normal. No murmur heard.   No friction rub. No gallop.  Pulmonary:     Effort: Pulmonary effort is normal. No accessory muscle usage or respiratory distress.     Breath sounds: Normal breath sounds and air entry. No stridor. No wheezing or rales.  Chest:     Chest wall: No tenderness.     Comments: Declined clinical breast exam, will schedule mammogram.  Abdominal:     General: Bowel sounds are normal. There is no distension.     Palpations: Abdomen is soft. There is no mass.     Tenderness: There is no abdominal tenderness. There is no guarding or rebound.  Musculoskeletal:        General: No tenderness or deformity. Normal range of motion.     Cervical back: Normal range of motion and neck supple.  Right lower leg: No edema.     Left lower leg: No edema.  Lymphadenopathy:     Cervical: No cervical  adenopathy.  Skin:    General: Skin is warm and dry.     Capillary Refill: Capillary refill takes less than 2 seconds.     Coloration: Skin is not pale.     Findings: No erythema or rash.  Neurological:     Mental Status: She is alert and oriented to person, place, and time.     Cranial Nerves: No cranial nerve deficit.     Motor: No abnormal muscle tone.     Coordination: Coordination normal.     Gait: Gait normal.     Deep Tendon Reflexes: Reflexes are normal and symmetric.  Psychiatric:        Attention and Perception: Attention normal.        Mood and Affect: Mood and affect normal.        Behavior: Behavior normal. Behavior is cooperative.        Thought Content: Thought content normal.        Judgment: Judgment normal.     Assessment/Plan: 1. Encounter for routine adult health examination with abnormal findings Age-appropriate preventive screenings and vaccinations discussed, annual physical exam completed. Routine labs for health maintenance ordered, see below. PHM updated. Mammogram ordered. Sees [facility] for women's health and will have pap done in November this year. Screening colonoscopy due in 2029. Declined recommended vaccines for now.   2. Acquired hypothyroidism Repeat thyroid level to evaluate effectiveness of increased dose of levothyroxine. Other routine labs ordered as well. - TSH + free T4 - Lipid Profile - CMP14+EGFR  3. 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 Taking [place]. Has prescription for zofran because Gleevec causes nausea.   4. Vitamin D deficiency Repeat vitamin D level, in February level was normal. She is taking 50,000 units weekly of vitamin D. Consider discontinuing prescription if level is still normal and recommend OTC vitamin D supplement.  - Vitamin D (25 hydroxy)  5. Adult attention deficit disorder Current medication and dose is effective.  UDS done 3 months of refills ordered. Follow up in 3 months and repeat UDS. [place] Controlled Substance Database was reviewed by me for overdose risk score (ORS). ORS is 100.  Refilled Controlled medications today. Reviewed risks and possible side effects associated with taking Stimulants. Combination of these drugs with other psychotropic medications could cause dizziness and drowsiness. Jae needs to monitor symptoms and exercise caution in driving and operating heavy machinery to avoid damages to oneself, to others and to the surroundings. Malia verbalized understanding in this matter. Dependence and abuse for these drugs will be monitored closely. A Controlled substance policy and procedure is on file which allows [facility] to order a urine drug screen test at any visit. Khiya understands and agrees with the plan. - amphetamine-dextroamphetamine (ADDERALL XR) 20 MG 24 hr capsule; Take 1 capsule (20 mg total) by mouth every morning.  Dispense: 30 capsule; Refill: 0 - amphetamine-dextroamphetamine (ADDERALL XR) 20 MG 24 hr capsule; Take 1 capsule (20 mg total) by mouth every morning.  Dispense: 30 capsule; Refill: 0 - amphetamine-dextroamphetamine (ADDERALL XR) 20 MG 24 hr capsule; Take 1 capsule (20 mg total) by mouth every morning.  Dispense: 30 capsule; Refill: 0 - amphetamine-dextroamphetamine (ADDERALL) 10 MG tablet; Take 1 tablet (10 mg total) by mouth daily as needed.  Dispense: 30 tablet;  Refill: 0  6. Generalized anxiety disorder Takes alprazolam as needed at bedtime for anxiety. She does not need a refill at this time. ORS is 100. Reviewed risks and possible side effects associated with taking opiates, benzodiazepines and other CNS depressants. Combination of these could cause dizziness and drowsiness. Advised patient not to drive or operate machinery when taking these medications, as patient's and other's life can be at risk and will have consequences. Patient verbalized understanding  in this matter. Dependence and abuse for these drugs will be monitored closely. A Controlled substance policy and procedure is on file which allows [facility] to order a urine drug screen test at any visit. Patient understands and agrees with the plan  7. History of iron deficiency anemia Repeat CBC, B12 and folate levels - B12 and Folate Panel - CBC with differential/platelet  8. Encounter for screening mammogram for malignant neoplasm of breast Routine screening mammogram ordered, patient has history of augmentation mammoplasty. - MM DIGITAL SCREENING W/ IMPLANTS BILATERAL; Future  9. Encounter for long-term (current) use of medications UDS done in office today. Positive for benzo and amphetamine which is consistent with her current prescriptions.  - POCT Urine Drug Screen  10. Dysuria Routine urinalysis done.  - UA/M w/rflx Culture, Routine      General Counseling: Kevia verbalizes understanding of the findings of todays visit and agrees with plan of treatment. I have discussed any further diagnostic evaluation that may be needed or ordered today. We also reviewed her medications today. she has been encouraged to call the office with any questions or concerns that should arise related to todays visit.    Orders Placed This Encounter  Procedures   UA/M w/rflx Culture, Routine   TSH + free T4   Lipid Profile   CMP14+EGFR   Vitamin D (25 hydroxy)   B12 and Folate Panel   POCT Urine Drug Screen    Meds ordered this encounter  Medications   amphetamine-dextroamphetamine (ADDERALL XR) 20 MG 24 hr capsule    Sig: Take 1 capsule (20 mg total) by mouth every morning.    Dispense:  30 capsule    Refill:  0    Please fill on 06/02/21   amphetamine-dextroamphetamine (ADDERALL XR) 20 MG 24 hr capsule    Sig: Take 1 capsule (20 mg total) by mouth every morning.    Dispense:  30 capsule    Refill:  0    Please fill on 06/30/21   amphetamine-dextroamphetamine (ADDERALL  XR) 20 MG 24 hr capsule    Sig: Take 1 capsule (20 mg total) by mouth every morning.    Dispense:  30 capsule    Refill:  0    Please fill on 07/28/21   amphetamine-dextroamphetamine (ADDERALL) 10 MG tablet    Sig: Take 1 tablet (10 mg total) by mouth daily as needed.    Dispense:  30 tablet    Refill:  0     Return in about 3 months (around 08/11/2021) for F/U, ADHD med check, Abraham Entwistle PCP.   Total time spent:30 Minutes Time spent includes review of chart, medications, test results, and follow up plan with the patient.   [place] Controlled Substance Database was reviewed by me.  This patient was seen by Jonetta Osgood, FNP-C in collaboration with Dr. Clayborn Bigness as a part of collaborative care agreement.  Ilani Otterson R. Valetta Fuller, MSN, FNP-C Internal medicine

## 2021-05-13 ENCOUNTER — Encounter: Payer: Self-pay | Admitting: Nurse Practitioner

## 2021-05-13 LAB — UA/M W/RFLX CULTURE, ROUTINE
Bilirubin, UA: NEGATIVE
Glucose, UA: NEGATIVE
Ketones, UA: NEGATIVE
Leukocytes,UA: NEGATIVE
Nitrite, UA: NEGATIVE
Protein,UA: NEGATIVE
RBC, UA: NEGATIVE
Specific Gravity, UA: 1.018 (ref 1.005–1.030)
Urobilinogen, Ur: 0.2 mg/dL (ref 0.2–1.0)
pH, UA: 5.5 (ref 5.0–7.5)

## 2021-05-13 LAB — MICROSCOPIC EXAMINATION
Bacteria, UA: NONE SEEN
Casts: NONE SEEN /lpf

## 2021-05-19 ENCOUNTER — Other Ambulatory Visit: Payer: Self-pay | Admitting: Internal Medicine

## 2021-05-19 DIAGNOSIS — R112 Nausea with vomiting, unspecified: Secondary | ICD-10-CM

## 2021-05-28 ENCOUNTER — Encounter: Payer: Self-pay | Admitting: Nurse Practitioner

## 2021-05-28 LAB — CMP14+EGFR
ALT: 34 IU/L — ABNORMAL HIGH (ref 0–32)
AST: 23 IU/L (ref 0–40)
Albumin/Globulin Ratio: 1.8 (ref 1.2–2.2)
Albumin: 4.7 g/dL (ref 3.8–4.9)
Alkaline Phosphatase: 95 IU/L (ref 44–121)
BUN/Creatinine Ratio: 24 — ABNORMAL HIGH (ref 9–23)
BUN: 15 mg/dL (ref 6–24)
Bilirubin Total: 0.2 mg/dL (ref 0.0–1.2)
CO2: 20 mmol/L (ref 20–29)
Calcium: 9.9 mg/dL (ref 8.7–10.2)
Chloride: 100 mmol/L (ref 96–106)
Creatinine, Ser: 0.63 mg/dL (ref 0.57–1.00)
Globulin, Total: 2.6 g/dL (ref 1.5–4.5)
Glucose: 74 mg/dL (ref 70–99)
Potassium: 4.6 mmol/L (ref 3.5–5.2)
Sodium: 139 mmol/L (ref 134–144)
Total Protein: 7.3 g/dL (ref 6.0–8.5)
eGFR: 105 mL/min/{1.73_m2} (ref >59)

## 2021-05-28 LAB — LIPID PANEL
Chol/HDL Ratio: 4.4 ratio (ref 0.0–4.4)
Cholesterol, Total: 303 mg/dL — ABNORMAL HIGH (ref 100–199)
HDL: 69 mg/dL (ref >39)
LDL Chol Calc (NIH): 115 mg/dL — ABNORMAL HIGH (ref 0–99)
Triglycerides: 676 mg/dL (ref 0–149)
VLDL Cholesterol Cal: 119 mg/dL — ABNORMAL HIGH (ref 5–40)

## 2021-05-28 LAB — B12 AND FOLATE PANEL
Folate: >20 ng/mL (ref >3.0)
Vitamin B-12: 539 pg/mL (ref 232–1245)

## 2021-05-28 LAB — VITAMIN D 25 HYDROXY (VIT D DEFICIENCY, FRACTURES): Vit D, 25-Hydroxy: 23 ng/mL — ABNORMAL LOW (ref 30.0–100.0)

## 2021-05-28 LAB — TSH+FREE T4
Free T4: 1.2 ng/dL (ref 0.82–1.77)
TSH: 6.38 u[IU]/mL — ABNORMAL HIGH (ref 0.450–4.500)

## 2021-06-03 ENCOUNTER — Other Ambulatory Visit: Payer: Self-pay

## 2021-06-03 DIAGNOSIS — K219 Gastro-esophageal reflux disease without esophagitis: Secondary | ICD-10-CM

## 2021-06-03 MED ORDER — ESOMEPRAZOLE MAGNESIUM 40 MG PO CPDR: 40.0000 mg | DELAYED_RELEASE_CAPSULE | Freq: Two times a day (BID) | ORAL | 1 refills | Status: DC

## 2021-06-04 ENCOUNTER — Other Ambulatory Visit: Payer: Self-pay

## 2021-06-04 ENCOUNTER — Encounter: Payer: Self-pay | Admitting: Nurse Practitioner

## 2021-06-04 DIAGNOSIS — F411 Generalized anxiety disorder: Secondary | ICD-10-CM

## 2021-06-04 DIAGNOSIS — E039 Hypothyroidism, unspecified: Secondary | ICD-10-CM

## 2021-06-04 DIAGNOSIS — F5102 Adjustment insomnia: Secondary | ICD-10-CM

## 2021-06-04 MED ORDER — LEVOTHYROXINE SODIUM 112 MCG PO TABS: 112.0000 ug | ORAL_TABLET | Freq: Every day | ORAL | 0 refills | Status: DC

## 2021-06-04 MED ORDER — LEVOTHYROXINE SODIUM 137 MCG PO TABS: 137.0000 ug | ORAL_TABLET | Freq: Every day | ORAL | 0 refills | Status: DC

## 2021-06-04 MED ORDER — ALPRAZOLAM 0.5 MG PO TABS: 0.5000 mg | ORAL_TABLET | Freq: Every evening | ORAL | 2 refills | Status: DC | PRN

## 2021-06-04 NOTE — Telephone Encounter (Signed)
Phar called for clarification about synthroid dose it change 0.137 mcg so please d/c 0.112 mcg as per alyssa advised its change in dose so filled 0.137 mcg

## 2021-06-23 NOTE — Progress Notes (Signed)
I have reviewed the lab results. There are no critically abnormal values requiring immediate intervention but there are some abnormals that will be discussed at the next office visit.  The lipid panel is inaccurate, as patient already contacted staff via mychart message and stated she did not fast for the labs as instructed.

## 2021-07-28 ENCOUNTER — Encounter: Payer: Self-pay | Admitting: Nurse Practitioner

## 2021-08-03 ENCOUNTER — Encounter: Payer: Self-pay | Admitting: Nurse Practitioner

## 2021-08-03 ENCOUNTER — Other Ambulatory Visit: Payer: Self-pay | Admitting: Nurse Practitioner

## 2021-08-03 LAB — TSH+FREE T4
Free T4: 2.08 ng/dL — ABNORMAL HIGH (ref 0.82–1.77)
TSH: <0.005 u[IU]/mL — ABNORMAL LOW (ref 0.450–4.500)

## 2021-08-03 MED ORDER — LEVOTHYROXINE SODIUM 125 MCG PO TABS: 125.0000 ug | ORAL_TABLET | Freq: Every day | ORAL | 2 refills | Status: DC

## 2021-08-03 NOTE — Progress Notes (Signed)
Levothyroxine dose decreased to 125 mcg daily. Patient will be notified today. Repeat thyroid levels in 6 weeks

## 2021-08-03 NOTE — Telephone Encounter (Signed)
Please call patient and let her know that I have decreased her levothyroxine dose to 125 mcg. We will recheck her thyroid levels in 6 weeks

## 2021-08-03 NOTE — Progress Notes (Signed)
Levothyroxine

## 2021-08-04 ENCOUNTER — Ambulatory Visit: Payer: 59 | Admitting: Nurse Practitioner

## 2021-08-04 ENCOUNTER — Encounter: Payer: Self-pay | Admitting: Nurse Practitioner

## 2021-08-04 ENCOUNTER — Other Ambulatory Visit: Payer: Self-pay

## 2021-08-04 VITALS — BP 135/85 | HR 97 | Temp 98.9°F | Resp 16 | Ht 63.0 in | Wt 128.8 lb

## 2021-08-04 DIAGNOSIS — F411 Generalized anxiety disorder: Secondary | ICD-10-CM

## 2021-08-04 DIAGNOSIS — F988 Other specified behavioral and emotional disorders with onset usually occurring in childhood and adolescence: Secondary | ICD-10-CM

## 2021-08-04 DIAGNOSIS — E559 Vitamin D deficiency, unspecified: Secondary | ICD-10-CM

## 2021-08-04 DIAGNOSIS — F5102 Adjustment insomnia: Secondary | ICD-10-CM

## 2021-08-04 DIAGNOSIS — E7889 Other lipoprotein metabolism disorders: Secondary | ICD-10-CM

## 2021-08-04 DIAGNOSIS — E039 Hypothyroidism, unspecified: Secondary | ICD-10-CM

## 2021-08-04 MED ORDER — ALPRAZOLAM 0.5 MG PO TABS: 0.5000 mg | ORAL_TABLET | Freq: Every evening | ORAL | 2 refills | Status: DC | PRN

## 2021-08-04 MED ORDER — ERGOCALCIFEROL 1.25 MG (50000 UT) PO CAPS: 50000.0000 [IU] | ORAL_CAPSULE | ORAL | 3 refills | Status: DC

## 2021-08-04 MED ORDER — AMPHETAMINE-DEXTROAMPHET ER 20 MG PO CP24: 20.0000 mg | ORAL_CAPSULE | ORAL | 0 refills | Status: DC

## 2021-08-04 MED ORDER — LEVOTHYROXINE SODIUM 125 MCG PO TABS: 125.0000 ug | ORAL_TABLET | Freq: Every day | ORAL | 2 refills | Status: DC

## 2021-08-04 NOTE — Progress Notes (Signed)
[facility] [place], [place] [address]  Internal MEDICINE  Office Visit Note  Patient Name: Yesenia Ward  [id number]  [id number]  Date of Service: 08/04/2021  Chief Complaint  Patient presents with   Follow-up    Discuss thyroid levels    Results    HPI Shanette presents for a follow up visit for ADHD and medication refills and to discuss her thyroid levels. In October her levothyroxine dose was increased to 137 mcg daily due to her TSH level still being elevated at 6.38. On 08/02/21, two days prior to her visit today, she had her labs drawn and her thyroid levels were hyper with a TSH <0.005 and a free T4 of 2.08. Her levothyroxine dose was decreased to 125 mcg daily prior to her office visit today and will be rechecked in 6 weeks.  She also needs her lipid panel repeated. Her last one was significantly abnormal and she ate a full meal prior to having her levels drawn.  She needs refills of adderall and alprazolam today. Her current dose of adderall is effective and she denies having any adverse side effects of the medication. Her blood pressure and heart rate were initially elevated but improved when rechecked, see vitals. This elevation in BP and heart rate may be stemming from her hyperthyroid state which will improve with the decrease in her levothyroxine dose.  She takes alprazolam as needed for anxiety and is stable with current dose.  Her vitamin D level was low in September at 23.0. the prescription vitamin D supplement will continue, she needs refills.   Current Medication: Outpatient Encounter Medications as of 08/04/2021  Medication Sig   acetaminophen (TYLENOL) 500 MG tablet Take 500 mg by mouth every 6 (six) hours as needed.   amphetamine-dextroamphetamine (ADDERALL) 10 MG tablet Take 1 tablet (10 mg total) by mouth daily as needed.   azelastine (ASTELIN) 0.1 % nasal spray Place 2 sprays into both nostrils 2 (two) times daily. Use in each nostril as  directed   b complex vitamins capsule Take 1 capsule by mouth daily.   esomeprazole (NEXIUM) 40 MG capsule Take 1 capsule (40 mg total) by mouth 2 (two) times daily before a meal.   Estradiol-Progesterone 1-100 MG CAPS Take by mouth.   Fexofenadine-Pseudoephedrine (ALLEGRA-D 12 HOUR PO) Take by mouth.   FLUoxetine (PROZAC) 20 MG tablet Take 20 mg by mouth daily.   imatinib (GLEEVEC) 100 MG tablet Take 100 mg by mouth 2 (two) times daily. Take with meals and large glass of water.Caution:Chemotherapy   methylPREDNISolone (MEDROL DOSEPAK) 4 MG TBPK tablet Take by mouth.   ondansetron (ZOFRAN) 4 MG tablet TAKE 1 TABLET(4 MG) BY MOUTH EVERY 8 HOURS AS NEEDED FOR NAUSEA OR VOMITING   Oxymetazoline HCl (UPNEEQ) 0.1 % SOLN Apply to eye.   [DISCONTINUED] ALPRAZolam (XANAX) 0.5 MG tablet Take 1-2 tablets (0.5-1 mg total) by mouth at bedtime as needed for anxiety. Take 1-2 tablets as needed at bedtime.   [DISCONTINUED] amphetamine-dextroamphetamine (ADDERALL XR) 20 MG 24 hr capsule Take 1 capsule (20 mg total) by mouth every morning.   [DISCONTINUED] amphetamine-dextroamphetamine (ADDERALL XR) 20 MG 24 hr capsule Take 1 capsule (20 mg total) by mouth every morning.   [DISCONTINUED] ergocalciferol (DRISDOL) 1.25 MG (50000 UT) capsule Take 1 capsule (50,000 Units total) by mouth once a week.   ALPRAZolam (XANAX) 0.5 MG tablet Take 1-2 tablets (0.5-1 mg total) by mouth at bedtime as needed for anxiety. Take 1-2 tablets as needed  at bedtime.   amphetamine-dextroamphetamine (ADDERALL XR) 20 MG 24 hr capsule Take 1 capsule (20 mg total) by mouth every morning.   amphetamine-dextroamphetamine (ADDERALL XR) 20 MG 24 hr capsule Take 1 capsule (20 mg total) by mouth every morning.   [START ON 10/03/2021] amphetamine-dextroamphetamine (ADDERALL XR) 20 MG 24 hr capsule Take 1 capsule (20 mg total) by mouth every morning.   levothyroxine (SYNTHROID) 125 MCG tablet Take 1 tablet (125 mcg total) by mouth daily before  breakfast.   [DISCONTINUED] amphetamine-dextroamphetamine (ADDERALL XR) 20 MG 24 hr capsule Take 1 capsule (20 mg total) by mouth every morning. (Patient not taking: Reported on 08/04/2021)   [DISCONTINUED] amphetamine-dextroamphetamine (ADDERALL XR) 20 MG 24 hr capsule Take 1 capsule (20 mg total) by mouth every morning. (Patient not taking: Reported on 08/04/2021)   [DISCONTINUED] ergocalciferol (DRISDOL) 1.25 MG (50000 UT) capsule Take 1 capsule (50,000 Units total) by mouth once a week.   [DISCONTINUED] levothyroxine (SYNTHROID) 125 MCG tablet Take 1 tablet (125 mcg total) by mouth daily before breakfast. (Patient not taking: Reported on 08/04/2021)   No facility-administered encounter medications on file as of 08/04/2021.    Surgical History: Past Surgical History:  Procedure Laterality Date   AUGMENTATION MAMMAPLASTY Bilateral 2005   gist tumor remooval     OVARIAN CYST REMOVAL Left    TUBAL LIGATION      Medical History: Past Medical History:  Diagnosis Date   GIST (gastrointestinal stromal tumor), non-malignant    Hypothyroid    Tumor     Family History: Family History  Problem Relation Age of Onset   Diabetes Mother    Coronary artery disease Mother    Cirrhosis Father    Cancer Maternal Uncle    Rectal cancer Paternal Aunt    Cancer Paternal Aunt     Social History   Socioeconomic History   Marital status: Married    Spouse name: Not on file   Number of children: Not on file   Years of education: Not on file   Highest education level: Not on file  Occupational History   Not on file  Tobacco Use   Smoking status: Former   Smokeless tobacco: Never  Vaping Use   Vaping Use: Never used  Substance and Sexual Activity   Alcohol use: Yes    Comment: occasionally   Drug use: Never   Sexual activity: Not on file  Other Topics Concern   Not on file  Social History Narrative   Not on file   Social Determinants of Health   Financial Resource Strain: Not on  file  Food Insecurity: Not on file  Transportation Needs: Not on file  Physical Activity: Not on file  Stress: Not on file  Social Connections: Not on file  Intimate Partner Violence: Not on file      Review of Systems  Constitutional: Negative.  Negative for chills, fatigue and unexpected weight change.  HENT:  Negative for congestion, rhinorrhea, sneezing and sore throat.   Eyes:  Negative for redness.  Respiratory: Negative.  Negative for cough, chest tightness, shortness of breath and wheezing.   Cardiovascular: Negative.  Negative for chest pain and palpitations.  Gastrointestinal:  Negative for abdominal pain, constipation, diarrhea, nausea and vomiting.  Genitourinary:  Negative for dysuria and frequency.  Musculoskeletal:  Negative for arthralgias, back pain, joint swelling and neck pain.  Skin:  Negative for rash.  Neurological: Negative.  Negative for tremors and numbness.  Hematological:  Negative for adenopathy. Does not  bruise/bleed easily.  Psychiatric/Behavioral:  Negative for behavioral problems (Depression), sleep disturbance and suicidal ideas. The patient is not nervous/anxious.    Vital Signs: BP 135/85 Comment: 141/91  Pulse 97 Comment: 117  Temp 98.9 F (37.2 C)   Resp 16   Ht 5\' 3"  (1.6 m)   Wt 128 lb 12.8 oz (58.4 kg)   SpO2 98%   BMI 22.82 kg/m    Physical Exam Vitals reviewed.  Constitutional:      General: She is not in acute distress.    Appearance: Normal appearance. She is normal weight. She is not ill-appearing.  HENT:     Head: Normocephalic and atraumatic.  Eyes:     Pupils: Pupils are equal, round, and reactive to light.  Cardiovascular:     Rate and Rhythm: Normal rate and regular rhythm.  Pulmonary:     Effort: Pulmonary effort is normal. No respiratory distress.  Neurological:     Mental Status: She is alert and oriented to person, place, and time.     Cranial Nerves: No cranial nerve deficit.     Coordination: Coordination  normal.     Gait: Gait normal.  Psychiatric:        Mood and Affect: Mood normal.        Behavior: Behavior normal.       Assessment/Plan: 1. Acquired hypothyroidism Dose was decreased to 125 mcg daily. Thyroid levels will be repeated in 6 weeks. Ordered sent and lab requisition slip given to patient.  - levothyroxine (SYNTHROID) 125 MCG tablet; Take 1 tablet (125 mcg total) by mouth daily before breakfast.  Dispense: 30 tablet; Refill: 2 - Lipid Profile - TSH + free T4  2. Vitamin D deficiency Continue vitamin D supplement 50,000 capsule, take 1 capsule weekly, additional refills sent to pharmacy.   3. Generalized anxiety disorder Continue alprazolam as prescribed, refills ordered.  - ALPRAZolam (XANAX) 0.5 MG tablet; Take 1-2 tablets (0.5-1 mg total) by mouth at bedtime as needed for anxiety. Take 1-2 tablets as needed at bedtime.  Dispense: 60 tablet; Refill: 2  4. Adjustment insomnia Continue alprazolam as prescribed.  - ALPRAZolam (XANAX) 0.5 MG tablet; Take 1-2 tablets (0.5-1 mg total) by mouth at bedtime as needed for anxiety. Take 1-2 tablets as needed at bedtime.  Dispense: 60 tablet; Refill: 2  5. Adult attention deficit disorder Medication and dose are effective, 3 months of refills ordered, follow up in 3 months for UDS and additional refills.  - amphetamine-dextroamphetamine (ADDERALL XR) 20 MG 24 hr capsule; Take 1 capsule (20 mg total) by mouth every morning.  Dispense: 30 capsule; Refill: 0 - amphetamine-dextroamphetamine (ADDERALL XR) 20 MG 24 hr capsule; Take 1 capsule (20 mg total) by mouth every morning.  Dispense: 30 capsule; Refill: 0 - amphetamine-dextroamphetamine (ADDERALL XR) 20 MG 24 hr capsule; Take 1 capsule (20 mg total) by mouth every morning.  Dispense: 30 capsule; Refill: 0  6. Lipids abnormal Repeat lipid profile, make sure to be fasting for it at least 6-8 hours prior to lab draw - Lipid Profile   General Counseling: willeen novak  understanding of the findings of todays visit and agrees with plan of treatment. I have discussed any further diagnostic evaluation that may be needed or ordered today. We also reviewed her medications today. she has been encouraged to call the office with any questions or concerns that should arise related to todays visit.    Orders Placed This Encounter  Procedures   Lipid Profile  TSH + free T4    Meds ordered this encounter  Medications   levothyroxine (SYNTHROID) 125 MCG tablet    Sig: Take 1 tablet (125 mcg total) by mouth daily before breakfast.    Dispense:  30 tablet    Refill:  2   ALPRAZolam (XANAX) 0.5 MG tablet    Sig: Take 1-2 tablets (0.5-1 mg total) by mouth at bedtime as needed for anxiety. Take 1-2 tablets as needed at bedtime.    Dispense:  60 tablet    Refill:  2   amphetamine-dextroamphetamine (ADDERALL XR) 20 MG 24 hr capsule    Sig: Take 1 capsule (20 mg total) by mouth every morning.    Dispense:  30 capsule    Refill:  0   DISCONTD: ergocalciferol (DRISDOL) 1.25 MG (50000 UT) capsule    Sig: Take 1 capsule (50,000 Units total) by mouth once a week.    Dispense:  12 capsule    Refill:  3   amphetamine-dextroamphetamine (ADDERALL XR) 20 MG 24 hr capsule    Sig: Take 1 capsule (20 mg total) by mouth every morning.    Dispense:  30 capsule    Refill:  0    Please fill on 09/06/21   amphetamine-dextroamphetamine (ADDERALL XR) 20 MG 24 hr capsule    Sig: Take 1 capsule (20 mg total) by mouth every morning.    Dispense:  30 capsule    Refill:  0    Please fill on 10/03/21    Return in about 3 months (around 11/02/2021) for F/U, anxiety med refill, ADHD med check, Lenox Ladouceur PCP need UDS next office visit .Marland Kitchen   Total time spent:30 Minutes Time spent includes review of chart, medications, test results, and follow up plan with the patient.   [place] Controlled Substance Database was reviewed by me.  This patient was seen by Jonetta Osgood, FNP-C in collaboration with  Dr. Clayborn Bigness as a part of collaborative care agreement.   Cutler Sunday R. Valetta Fuller, MSN, FNP-C Internal medicine

## 2021-08-05 ENCOUNTER — Other Ambulatory Visit: Payer: Self-pay

## 2021-08-05 DIAGNOSIS — E559 Vitamin D deficiency, unspecified: Secondary | ICD-10-CM

## 2021-08-05 MED ORDER — ERGOCALCIFEROL 1.25 MG (50000 UT) PO CAPS: 50000.0000 [IU] | ORAL_CAPSULE | ORAL | 3 refills | Status: DC

## 2021-08-11 ENCOUNTER — Ambulatory Visit: Payer: 59 | Admitting: Nurse Practitioner

## 2021-09-05 ENCOUNTER — Encounter: Payer: Self-pay | Admitting: Nurse Practitioner

## 2021-09-05 MED ORDER — AMPHETAMINE-DEXTROAMPHET ER 20 MG PO CP24: 20.0000 mg | ORAL_CAPSULE | ORAL | 0 refills | Status: DC

## 2021-11-01 DIAGNOSIS — E039 Hypothyroidism, unspecified: Secondary | ICD-10-CM | POA: Diagnosis not present

## 2021-11-02 ENCOUNTER — Ambulatory Visit: Payer: BC Managed Care – PPO | Admitting: Nurse Practitioner

## 2021-11-02 LAB — LIPID PANEL
Chol/HDL Ratio: 4.1 ratio (ref 0.0–4.4)
Cholesterol, Total: 265 mg/dL — ABNORMAL HIGH (ref 100–199)
HDL: 64 mg/dL (ref >39)
LDL Chol Calc (NIH): 126 mg/dL — ABNORMAL HIGH (ref 0–99)
Triglycerides: 425 mg/dL — ABNORMAL HIGH (ref 0–149)
VLDL Cholesterol Cal: 75 mg/dL — ABNORMAL HIGH (ref 5–40)

## 2021-11-02 LAB — TSH+FREE T4
Free T4: 1.48 ng/dL (ref 0.82–1.77)
TSH: 0.055 u[IU]/mL — ABNORMAL LOW (ref 0.450–4.500)

## 2021-11-02 NOTE — Progress Notes (Signed)
I have reviewed the lab results. There are no critically abnormal values requiring immediate intervention but there are some abnormals that will be discussed at the next office visit.  

## 2021-11-08 ENCOUNTER — Other Ambulatory Visit: Payer: Self-pay

## 2021-11-08 ENCOUNTER — Encounter: Payer: Self-pay | Admitting: Nurse Practitioner

## 2021-11-08 ENCOUNTER — Ambulatory Visit: Payer: BC Managed Care – PPO | Admitting: Nurse Practitioner

## 2021-11-08 VITALS — BP 126/89 | HR 60 | Temp 98.4°F | Resp 16 | Ht 63.0 in | Wt 128.4 lb

## 2021-11-08 DIAGNOSIS — E039 Hypothyroidism, unspecified: Secondary | ICD-10-CM | POA: Diagnosis not present

## 2021-11-08 DIAGNOSIS — E782 Mixed hyperlipidemia: Secondary | ICD-10-CM | POA: Diagnosis not present

## 2021-11-08 DIAGNOSIS — F988 Other specified behavioral and emotional disorders with onset usually occurring in childhood and adolescence: Secondary | ICD-10-CM

## 2021-11-08 DIAGNOSIS — R1114 Bilious vomiting: Secondary | ICD-10-CM | POA: Diagnosis not present

## 2021-11-08 DIAGNOSIS — Z79899 Other long term (current) drug therapy: Secondary | ICD-10-CM | POA: Diagnosis not present

## 2021-11-08 DIAGNOSIS — Z0100 Encounter for examination of eyes and vision without abnormal findings: Secondary | ICD-10-CM | POA: Diagnosis not present

## 2021-11-08 DIAGNOSIS — F411 Generalized anxiety disorder: Secondary | ICD-10-CM

## 2021-11-08 DIAGNOSIS — F5102 Adjustment insomnia: Secondary | ICD-10-CM

## 2021-11-08 DIAGNOSIS — H40003 Preglaucoma, unspecified, bilateral: Secondary | ICD-10-CM | POA: Diagnosis not present

## 2021-11-08 LAB — POCT URINE DRUG SCREEN
Methylenedioxyamphetamine: NOT DETECTED
POC Amphetamine UR: POSITIVE — AB
POC BENZODIAZEPINES UR: POSITIVE — AB
POC Barbiturate UR: NOT DETECTED
POC Cocaine UR: NOT DETECTED
POC Ecstasy UR: NOT DETECTED
POC Marijuana UR: NOT DETECTED
POC Methadone UR: NOT DETECTED
POC Methamphetamine UR: NOT DETECTED
POC Opiate Ur: NOT DETECTED
POC Oxycodone UR: NOT DETECTED
POC PHENCYCLIDINE UR: NOT DETECTED
POC TRICYCLICS UR: POSITIVE — AB

## 2021-11-08 MED ORDER — ONDANSETRON HCL 4 MG PO TABS: ORAL_TABLET | ORAL | 3 refills | Status: DC

## 2021-11-08 MED ORDER — AMPHETAMINE-DEXTROAMPHET ER 20 MG PO CP24: 20.0000 mg | ORAL_CAPSULE | ORAL | 0 refills | Status: DC

## 2021-11-08 MED ORDER — LEVOTHYROXINE SODIUM 125 MCG PO TABS: 125.0000 ug | ORAL_TABLET | Freq: Every day | ORAL | 1 refills | Status: DC

## 2021-11-08 MED ORDER — ALPRAZOLAM 0.5 MG PO TABS: 0.5000 mg | ORAL_TABLET | Freq: Every evening | ORAL | 2 refills | Status: DC | PRN

## 2021-11-08 MED ORDER — AMPHETAMINE-DEXTROAMPHETAMINE 10 MG PO TABS: 10.0000 mg | ORAL_TABLET | Freq: Every day | ORAL | 0 refills | Status: DC | PRN

## 2021-11-08 NOTE — Progress Notes (Cosign Needed)
[facility] [place], [place] [address]  Internal MEDICINE  Office Visit Note  Patient Name: Yesenia Ward  [id number]  [id number]  Date of Service: 11/08/2021  Chief Complaint  Patient presents with   Follow-up    ADHD   Hypothyroidism    HPI Yesenia Ward presents for follow-up visit for ADHD, hypothyroidism and to review recent lab results.  She continues to take extended release Adderall 20 mg daily with an additional dose of short acting Adderall 10 mg daily as needed for symptom control.  She reports that the current dose is effective and denies any adverse side effects of Adderall.  Her blood pressure and heart rate are within normal limits. Her weight and BMI are also normal. Previously, her free T4 level was elevated at 2.06 and patient was reporting feeling like she was having palpitations and having blurry vision.  Her levothyroxine dose was adjusted and her current thyroid levels are free T4 of 1.48 and TSH is 0.055 which the patient reports she is feeling much better and denies any blurry vision anymore or palpitations. Lipid panel was also drawn and patient reports that it was nonfasting.  Even though the lab was nonfasting, her lipid panel shows significant improvement.  Her total cholesterol level dropped from 303 to 265.  Her triglyceride level improved from 676 down to 425.  Her HDL level remains normal at 64.  Her VLDL level dropped from 119 down to 75.  Her current LDL did increase by 11 up to 126 and her current cholesterol/HDL ratio was 4.1 which improved slightly. Overall, her cholesterol levels are significantly improved.    Current Medication: Outpatient Encounter Medications as of 11/08/2021  Medication Sig   acetaminophen (TYLENOL) 500 MG tablet Take 500 mg by mouth every 6 (six) hours as needed.   ALPRAZolam (XANAX) 0.5 MG tablet Take 1-2 tablets (0.5-1 mg total) by mouth at bedtime as needed for anxiety. Take 1-2 tablets as needed at bedtime.    azelastine (ASTELIN) 0.1 % nasal spray Place 2 sprays into both nostrils 2 (two) times daily. Use in each nostril as directed   b complex vitamins capsule Take 1 capsule by mouth daily.   ergocalciferol (DRISDOL) 1.25 MG (50000 UT) capsule Take 1 capsule (50,000 Units total) by mouth once a week.   esomeprazole (NEXIUM) 40 MG capsule Take 1 capsule (40 mg total) by mouth 2 (two) times daily before a meal.   Estradiol-Progesterone 1-100 MG CAPS Take by mouth.   Fexofenadine-Pseudoephedrine (ALLEGRA-D 12 HOUR PO) Take by mouth.   FLUoxetine (PROZAC) 20 MG tablet Take 20 mg by mouth daily.   imatinib (GLEEVEC) 100 MG tablet Take 100 mg by mouth 2 (two) times daily. Take with meals and large glass of water.Caution:Chemotherapy   methylPREDNISolone (MEDROL DOSEPAK) 4 MG TBPK tablet Take by mouth.   Oxymetazoline HCl (UPNEEQ) 0.1 % SOLN Apply to eye.   [DISCONTINUED] amphetamine-dextroamphetamine (ADDERALL XR) 20 MG 24 hr capsule Take 1 capsule (20 mg total) by mouth every morning.   [DISCONTINUED] amphetamine-dextroamphetamine (ADDERALL XR) 20 MG 24 hr capsule Take 1 capsule (20 mg total) by mouth every morning.   [DISCONTINUED] amphetamine-dextroamphetamine (ADDERALL XR) 20 MG 24 hr capsule Take 1 capsule (20 mg total) by mouth every morning.   [DISCONTINUED] amphetamine-dextroamphetamine (ADDERALL) 10 MG tablet Take 1 tablet (10 mg total) by mouth daily as needed.   [DISCONTINUED] levothyroxine (SYNTHROID) 125 MCG tablet Take 1 tablet (125 mcg total) by mouth daily before breakfast.   [  DISCONTINUED] ondansetron (ZOFRAN) 4 MG tablet TAKE 1 TABLET(4 MG) BY MOUTH EVERY 8 HOURS AS NEEDED FOR NAUSEA OR VOMITING   amphetamine-dextroamphetamine (ADDERALL XR) 20 MG 24 hr capsule Take 1 capsule (20 mg total) by mouth every morning.   [START ON 12/06/2021] amphetamine-dextroamphetamine (ADDERALL XR) 20 MG 24 hr capsule Take 1 capsule (20 mg total) by mouth every morning.   [START ON 01/10/2022]  amphetamine-dextroamphetamine (ADDERALL XR) 20 MG 24 hr capsule Take 1 capsule (20 mg total) by mouth every morning.   amphetamine-dextroamphetamine (ADDERALL) 10 MG tablet Take 1 tablet (10 mg total) by mouth daily as needed (ADHD symptoms).   levothyroxine (SYNTHROID) 125 MCG tablet Take 1 tablet (125 mcg total) by mouth daily before breakfast.   ondansetron (ZOFRAN) 4 MG tablet TAKE 1 TABLET(4 MG) BY MOUTH EVERY 8 HOURS AS NEEDED FOR NAUSEA OR VOMITING   No facility-administered encounter medications on file as of 11/08/2021.    Surgical History: Past Surgical History:  Procedure Laterality Date   AUGMENTATION MAMMAPLASTY Bilateral 2005   gist tumor remooval     OVARIAN CYST REMOVAL Left    TUBAL LIGATION      Medical History: Past Medical History:  Diagnosis Date   GIST (gastrointestinal stromal tumor), non-malignant    Hypothyroid    Tumor     Family History: Family History  Problem Relation Age of Onset   Diabetes Mother    Coronary artery disease Mother    Cirrhosis Father    Cancer Maternal Uncle    Rectal cancer Paternal Aunt    Cancer Paternal Aunt     Social History   Socioeconomic History   Marital status: Married    Spouse name: Not on file   Number of children: Not on file   Years of education: Not on file   Highest education level: Not on file  Occupational History   Not on file  Tobacco Use   Smoking status: Former   Smokeless tobacco: Never  Vaping Use   Vaping Use: Never used  Substance and Sexual Activity   Alcohol use: Yes    Comment: occasionally   Drug use: Never   Sexual activity: Not on file  Other Topics Concern   Not on file  Social History Narrative   Not on file   Social Determinants of Health   Financial Resource Strain: Not on file  Food Insecurity: Not on file  Transportation Needs: Not on file  Physical Activity: Not on file  Stress: Not on file  Social Connections: Not on file  Intimate Partner Violence: Not on file       Review of Systems  Constitutional:  Negative for chills, fatigue and unexpected weight change.  HENT:  Negative for congestion, rhinorrhea, sneezing and sore throat.   Eyes:  Negative for redness.  Respiratory:  Negative for cough, chest tightness and shortness of breath.   Cardiovascular:  Negative for chest pain and palpitations.  Gastrointestinal:  Negative for abdominal pain, constipation, diarrhea, nausea and vomiting.  Genitourinary:  Negative for dysuria and frequency.  Musculoskeletal:  Negative for arthralgias, back pain, joint swelling and neck pain.  Skin:  Negative for rash.  Neurological: Negative.  Negative for tremors and numbness.  Hematological:  Negative for adenopathy. Does not bruise/bleed easily.  Psychiatric/Behavioral:  Negative for behavioral problems (Depression), sleep disturbance and suicidal ideas. The patient is not nervous/anxious.    Vital Signs: BP 126/89    Pulse 60    Temp 98.4 F (36.9 C)  Resp 16    Ht '5\' 3"'$  (1.6 m)    Wt 128 lb 6.4 oz (58.2 kg)    SpO2 97%    BMI 22.75 kg/m    Physical Exam Vitals reviewed.  Constitutional:      General: She is not in acute distress.    Appearance: Normal appearance. She is normal weight. She is not ill-appearing.  HENT:     Head: Normocephalic and atraumatic.  Eyes:     Pupils: Pupils are equal, round, and reactive to light.  Cardiovascular:     Rate and Rhythm: Normal rate and regular rhythm.  Pulmonary:     Effort: Pulmonary effort is normal. No respiratory distress.  Neurological:     Mental Status: She is alert and oriented to person, place, and time.     Cranial Nerves: No cranial nerve deficit.     Coordination: Coordination normal.     Gait: Gait normal.  Psychiatric:        Mood and Affect: Mood normal.        Behavior: Behavior normal.       Assessment/Plan: 1. Acquired hypothyroidism Continue current dose of levothyroxine, refills ordered.  We will repeat thyroid labs in 3 to  6 months - levothyroxine (SYNTHROID) 125 MCG tablet; Take 1 tablet (125 mcg total) by mouth daily before breakfast.  Dispense: 90 tablet; Refill: 1  2. Mixed hyperlipidemia Lipid panel is significantly improved although still abnormal. Lipid panel was drawn nonfasting.   3. Nausea and vomiting Refill ordered.  - ondansetron (ZOFRAN) 4 MG tablet; TAKE 1 TABLET(4 MG) BY MOUTH EVERY 8 HOURS AS NEEDED FOR NAUSEA OR VOMITING  Dispense: 20 tablet; Refill: 3  4. Encounter for long-term (current) drug use UDS results consistent with current medication prescriptions.  - POCT Urine Drug Screen  5. Adult attention deficit disorder Continue current dose of Adderall, 3 months of refills sent to pharmacy, follow-up visit in 3 months for additional refills. - amphetamine-dextroamphetamine (ADDERALL XR) 20 MG 24 hr capsule; Take 1 capsule (20 mg total) by mouth every morning.  Dispense: 30 capsule; Refill: 0 - amphetamine-dextroamphetamine (ADDERALL XR) 20 MG 24 hr capsule; Take 1 capsule (20 mg total) by mouth every morning.  Dispense: 30 capsule; Refill: 0 - amphetamine-dextroamphetamine (ADDERALL XR) 20 MG 24 hr capsule; Take 1 capsule (20 mg total) by mouth every morning.  Dispense: 30 capsule; Refill: 0 - amphetamine-dextroamphetamine (ADDERALL) 10 MG tablet; Take 1 tablet (10 mg total) by mouth daily as needed (ADHD symptoms).  Dispense: 90 tablet; Refill: 0  6. Generalized anxiety disorder Stable, refills ordered.  - ALPRAZolam (XANAX) 0.5 MG tablet; Take 1-2 tablets (0.5-1 mg total) by mouth at bedtime as needed for anxiety. Take 1-2 tablets as needed at bedtime.  Dispense: 60 tablet; Refill: 2  7. Adjustment insomnia Stable refills ordered - ALPRAZolam (XANAX) 0.5 MG tablet; Take 1-2 tablets (0.5-1 mg total) by mouth at bedtime as needed for anxiety. Take 1-2 tablets as needed at bedtime.  Dispense: 60 tablet; Refill: 2   General Counseling: Mckaylie verbalizes understanding of the findings of  todays visit and agrees with plan of treatment. I have discussed any further diagnostic evaluation that may be needed or ordered today. We also reviewed her medications today. she has been encouraged to call the office with any questions or concerns that should arise related to todays visit.    Orders Placed This Encounter  Procedures   POCT Urine Drug Screen    Meds ordered this  encounter  Medications   amphetamine-dextroamphetamine (ADDERALL XR) 20 MG 24 hr capsule    Sig: Take 1 capsule (20 mg total) by mouth every morning.    Dispense:  30 capsule    Refill:  0    Please fill for march   amphetamine-dextroamphetamine (ADDERALL XR) 20 MG 24 hr capsule    Sig: Take 1 capsule (20 mg total) by mouth every morning.    Dispense:  30 capsule    Refill:  0    Please fill for april   amphetamine-dextroamphetamine (ADDERALL XR) 20 MG 24 hr capsule    Sig: Take 1 capsule (20 mg total) by mouth every morning.    Dispense:  30 capsule    Refill:  0    Please fill for may   amphetamine-dextroamphetamine (ADDERALL) 10 MG tablet    Sig: Take 1 tablet (10 mg total) by mouth daily as needed (ADHD symptoms).    Dispense:  90 tablet    Refill:  0    3 month supply of extra additional dose to be taken after taking her 20 mg XR adderall dose   levothyroxine (SYNTHROID) 125 MCG tablet    Sig: Take 1 tablet (125 mcg total) by mouth daily before breakfast.    Dispense:  90 tablet    Refill:  1   ondansetron (ZOFRAN) 4 MG tablet    Sig: TAKE 1 TABLET(4 MG) BY MOUTH EVERY 8 HOURS AS NEEDED FOR NAUSEA OR VOMITING    Dispense:  20 tablet    Refill:  3    Return in about 3 months (around 02/08/2022) for F/U, ADHD med check, anxiety med refill, Rovena Hearld PCP.   Total time spent:30 Minutes Time spent includes review of chart, medications, test results, and follow up plan with the patient.   [place] Controlled Substance Database was reviewed by me.  This patient was seen by Jonetta Osgood, FNP-C in  collaboration with Dr. Clayborn Bigness as a part of collaborative care agreement.   Jameon Deller R. Valetta Fuller, MSN, FNP-C Internal medicine

## 2021-11-30 ENCOUNTER — Other Ambulatory Visit: Payer: Self-pay | Admitting: Nurse Practitioner

## 2021-11-30 DIAGNOSIS — K219 Gastro-esophageal reflux disease without esophagitis: Secondary | ICD-10-CM

## 2021-12-10 ENCOUNTER — Other Ambulatory Visit: Payer: Self-pay

## 2021-12-10 DIAGNOSIS — E039 Hypothyroidism, unspecified: Secondary | ICD-10-CM

## 2021-12-10 DIAGNOSIS — E559 Vitamin D deficiency, unspecified: Secondary | ICD-10-CM

## 2021-12-10 MED ORDER — ERGOCALCIFEROL 1.25 MG (50000 UT) PO CAPS: 50000.0000 [IU] | ORAL_CAPSULE | ORAL | 3 refills | Status: DC

## 2021-12-10 MED ORDER — FLUOXETINE HCL 20 MG PO TABS: 20.0000 mg | ORAL_TABLET | Freq: Every day | ORAL | 1 refills | Status: DC

## 2021-12-10 MED ORDER — LEVOTHYROXINE SODIUM 125 MCG PO TABS: 125.0000 ug | ORAL_TABLET | Freq: Every day | ORAL | 1 refills | Status: DC

## 2021-12-16 ENCOUNTER — Telehealth: Payer: Self-pay

## 2021-12-16 ENCOUNTER — Other Ambulatory Visit: Payer: Self-pay | Admitting: Nurse Practitioner

## 2021-12-16 DIAGNOSIS — L538 Other specified erythematous conditions: Secondary | ICD-10-CM | POA: Diagnosis not present

## 2021-12-16 DIAGNOSIS — L728 Other follicular cysts of the skin and subcutaneous tissue: Secondary | ICD-10-CM | POA: Diagnosis not present

## 2021-12-16 DIAGNOSIS — L280 Lichen simplex chronicus: Secondary | ICD-10-CM | POA: Diagnosis not present

## 2021-12-16 DIAGNOSIS — D2372 Other benign neoplasm of skin of left lower limb, including hip: Secondary | ICD-10-CM | POA: Diagnosis not present

## 2021-12-16 DIAGNOSIS — R208 Other disturbances of skin sensation: Secondary | ICD-10-CM | POA: Diagnosis not present

## 2021-12-16 DIAGNOSIS — D2371 Other benign neoplasm of skin of right lower limb, including hip: Secondary | ICD-10-CM | POA: Diagnosis not present

## 2021-12-16 MED ORDER — AMPHETAMINE-DEXTROAMPHET ER 25 MG PO CP24: 25.0000 mg | ORAL_CAPSULE | ORAL | 0 refills | Status: DC

## 2021-12-16 NOTE — Telephone Encounter (Signed)
Pt advised that we send med  

## 2022-01-06 ENCOUNTER — Other Ambulatory Visit: Payer: Self-pay | Admitting: Oculoplastics Ophthalmology

## 2022-01-06 DIAGNOSIS — E0500 Thyrotoxicosis with diffuse goiter without thyrotoxic crisis or storm: Secondary | ICD-10-CM

## 2022-01-06 DIAGNOSIS — H0520 Unspecified exophthalmos: Secondary | ICD-10-CM

## 2022-01-06 DIAGNOSIS — H0500 Unspecified acute inflammation of orbit: Secondary | ICD-10-CM | POA: Diagnosis not present

## 2022-01-17 ENCOUNTER — Ambulatory Visit
Admission: RE | Admit: 2022-01-17 | Discharge: 2022-01-17 | Disposition: A | Discharge status: Home / Self Care | Payer: BC Managed Care – PPO | Source: Ambulatory Visit | Attending: Oculoplastics Ophthalmology | Admitting: Oculoplastics Ophthalmology

## 2022-01-17 DIAGNOSIS — H0520 Unspecified exophthalmos: Secondary | ICD-10-CM

## 2022-01-17 DIAGNOSIS — J3489 Other specified disorders of nose and nasal sinuses: Secondary | ICD-10-CM | POA: Diagnosis not present

## 2022-01-17 DIAGNOSIS — E0500 Thyrotoxicosis with diffuse goiter without thyrotoxic crisis or storm: Secondary | ICD-10-CM

## 2022-01-17 DIAGNOSIS — H532 Diplopia: Secondary | ICD-10-CM | POA: Diagnosis not present

## 2022-02-07 ENCOUNTER — Encounter: Payer: Self-pay | Admitting: Nurse Practitioner

## 2022-02-07 ENCOUNTER — Ambulatory Visit: Payer: BC Managed Care – PPO | Admitting: Nurse Practitioner

## 2022-02-07 VITALS — BP 140/82 | HR 95 | Temp 98.8°F | Resp 16 | Ht 63.0 in | Wt 130.0 lb

## 2022-02-07 DIAGNOSIS — F411 Generalized anxiety disorder: Secondary | ICD-10-CM | POA: Diagnosis not present

## 2022-02-07 DIAGNOSIS — E039 Hypothyroidism, unspecified: Secondary | ICD-10-CM

## 2022-02-07 DIAGNOSIS — F5102 Adjustment insomnia: Secondary | ICD-10-CM | POA: Diagnosis not present

## 2022-02-07 DIAGNOSIS — F988 Other specified behavioral and emotional disorders with onset usually occurring in childhood and adolescence: Secondary | ICD-10-CM

## 2022-02-07 MED ORDER — ALPRAZOLAM 0.5 MG PO TABS: 0.5000 mg | ORAL_TABLET | Freq: Every evening | ORAL | 2 refills | Status: DC | PRN

## 2022-02-07 MED ORDER — AMPHETAMINE-DEXTROAMPHET ER 20 MG PO CP24: 20.0000 mg | ORAL_CAPSULE | ORAL | 0 refills | Status: DC

## 2022-02-07 MED ORDER — FLUOXETINE HCL 20 MG PO TABS: 20.0000 mg | ORAL_TABLET | Freq: Every day | ORAL | 1 refills | Status: DC

## 2022-02-07 MED ORDER — AMPHETAMINE-DEXTROAMPHETAMINE 10 MG PO TABS: 10.0000 mg | ORAL_TABLET | Freq: Every day | ORAL | 0 refills | Status: DC | PRN

## 2022-02-07 NOTE — Progress Notes (Signed)
[facility] [place],  [address]  Internal MEDICINE  Office Visit Note  Patient Name: Yesenia Ward  [id number]  [id number]  Date of Service: 02/07/2022  Chief Complaint  Patient presents with   Follow-up   Hypothyroidism   ADHD   Medication Refill    HPI Yesenia Ward presents for follow-up visit for ADHD, hypothyroidism and medication refills. Her current dose of adderall is effective. Her heart rate and BP are stable and wnl. She denies any palpitations or other adverse side effects of the adderall. She is due to have her thyroid levels checked again to reevaluate effectiveness of her current dose of levothyroxine. She is also in need of refills of fluoxetine.     Current Medication: Outpatient Encounter Medications as of 02/07/2022  Medication Sig   acetaminophen (TYLENOL) 500 MG tablet Take 500 mg by mouth every 6 (six) hours as needed.   ALPRAZolam (XANAX) 0.5 MG tablet Take 1-2 tablets (0.5-1 mg total) by mouth at bedtime as needed for anxiety. Take 1-2 tablets as needed at bedtime.   amphetamine-dextroamphetamine (ADDERALL XR) 20 MG 24 hr capsule Take 1 capsule (20 mg total) by mouth every morning.   amphetamine-dextroamphetamine (ADDERALL XR) 20 MG 24 hr capsule Take 1 capsule (20 mg total) by mouth every morning.   azelastine (ASTELIN) 0.1 % nasal spray Place 2 sprays into both nostrils 2 (two) times daily. Use in each nostril as directed   b complex vitamins capsule Take 1 capsule by mouth daily.   esomeprazole (NEXIUM) 40 MG capsule TAKE 1 CAPSULE TWICE A DAY BEFORE MEALS   Estradiol-Progesterone 1-100 MG CAPS Take by mouth.   Fexofenadine-Pseudoephedrine (ALLEGRA-D 12 HOUR PO) Take by mouth.   imatinib (GLEEVEC) 100 MG tablet Take 100 mg by mouth 2 (two) times daily. Take with meals and large glass of water.Caution:Chemotherapy   levothyroxine (SYNTHROID) 125 MCG tablet Take 1 tablet (125 mcg total) by mouth daily before breakfast.    methylPREDNISolone (MEDROL DOSEPAK) 4 MG TBPK tablet Take by mouth.   ondansetron (ZOFRAN) 4 MG tablet TAKE 1 TABLET(4 MG) BY MOUTH EVERY 8 HOURS AS NEEDED FOR NAUSEA OR VOMITING   Oxymetazoline HCl (UPNEEQ) 0.1 % SOLN Apply to eye.   [DISCONTINUED] amphetamine-dextroamphetamine (ADDERALL XR) 20 MG 24 hr capsule Take 1 capsule (20 mg total) by mouth every morning.   [DISCONTINUED] amphetamine-dextroamphetamine (ADDERALL XR) 25 MG 24 hr capsule Take 1 capsule by mouth every morning.   [DISCONTINUED] amphetamine-dextroamphetamine (ADDERALL) 10 MG tablet Take 1 tablet (10 mg total) by mouth daily as needed (ADHD symptoms).   [DISCONTINUED] ergocalciferol (DRISDOL) 1.25 MG (50000 UT) capsule Take 1 capsule (50,000 Units total) by mouth once a week.   [DISCONTINUED] FLUoxetine (PROZAC) 20 MG tablet Take 1 tablet (20 mg total) by mouth daily.   amphetamine-dextroamphetamine (ADDERALL XR) 20 MG 24 hr capsule Take 1 capsule (20 mg total) by mouth every morning.   [START ON 03/07/2022] amphetamine-dextroamphetamine (ADDERALL XR) 20 MG 24 hr capsule Take 1 capsule (20 mg total) by mouth every morning.   [START ON 04/04/2022] amphetamine-dextroamphetamine (ADDERALL XR) 20 MG 24 hr capsule Take 1 capsule (20 mg total) by mouth every morning.   amphetamine-dextroamphetamine (ADDERALL) 10 MG tablet Take 1 tablet (10 mg total) by mouth daily as needed (ADHD symptoms).   FLUoxetine (PROZAC) 20 MG tablet Take 1 tablet (20 mg total) by mouth daily.   No facility-administered encounter medications on file as of 02/07/2022.    Surgical History: Past Surgical History:  Procedure Laterality Date   AUGMENTATION MAMMAPLASTY Bilateral 2005   gist tumor remooval     OVARIAN CYST REMOVAL Left    TUBAL LIGATION      Medical History: Past Medical History:  Diagnosis Date   GIST (gastrointestinal stromal tumor), non-malignant    Hypothyroid    Tumor     Family History: Family History  Problem Relation Age of Onset    Diabetes Mother    Coronary artery disease Mother    Cirrhosis Father    Cancer Maternal Uncle    Rectal cancer Paternal Aunt    Cancer Paternal Aunt     Social History   Socioeconomic History   Marital status: Married    Spouse name: Not on file   Number of children: Not on file   Years of education: Not on file   Highest education level: Not on file  Occupational History   Not on file  Tobacco Use   Smoking status: Former   Smokeless tobacco: Never  Vaping Use   Vaping Use: Never used  Substance and Sexual Activity   Alcohol use: Yes    Comment: occasionally   Drug use: Never   Sexual activity: Not on file  Other Topics Concern   Not on file  Social History Narrative   Not on file   Social Determinants of Health   Financial Resource Strain: Not on file  Food Insecurity: Not on file  Transportation Needs: Not on file  Physical Activity: Not on file  Stress: Not on file  Social Connections: Not on file  Intimate Partner Violence: Not on file      Review of Systems  Constitutional:  Negative for chills, fatigue and unexpected weight change.  HENT:  Negative for congestion, rhinorrhea, sneezing and sore throat.   Eyes:  Negative for redness.  Respiratory:  Negative for cough, chest tightness and shortness of breath.   Cardiovascular:  Negative for chest pain and palpitations.  Gastrointestinal:  Negative for abdominal pain, constipation, diarrhea, nausea and vomiting.  Genitourinary:  Negative for dysuria and frequency.  Musculoskeletal:  Negative for arthralgias, back pain, joint swelling and neck pain.  Skin:  Negative for rash.  Neurological: Negative.  Negative for tremors and numbness.  Hematological:  Negative for adenopathy. Does not bruise/bleed easily.  Psychiatric/Behavioral:  Negative for behavioral problems (Depression), sleep disturbance and suicidal ideas. The patient is not nervous/anxious.     Vital Signs: BP 140/82   Pulse 95   Temp  98.8 F (37.1 C)   Resp 16   Ht '5\' 3"'$  (1.6 m)   Wt 130 lb (59 kg)   SpO2 98%   BMI 23.03 kg/m    Physical Exam Vitals reviewed.  Constitutional:      General: She is not in acute distress.    Appearance: Normal appearance. She is normal weight. She is not ill-appearing.  HENT:     Head: Normocephalic and atraumatic.  Eyes:     Pupils: Pupils are equal, round, and reactive to light.  Cardiovascular:     Rate and Rhythm: Normal rate and regular rhythm.  Pulmonary:     Effort: Pulmonary effort is normal. No respiratory distress.  Neurological:     Mental Status: She is alert and oriented to person, place, and time.     Cranial Nerves: No cranial nerve deficit.     Coordination: Coordination normal.     Gait: Gait normal.  Psychiatric:        Mood and  Affect: Mood normal.        Behavior: Behavior normal.       Assessment/Plan: 1. Acquired hypothyroidism Continue current dose of levothyroxine, refills ordered.  We will repeat thyroid labs in 3 to 6 months - levothyroxine (SYNTHROID) 125 MCG tablet; Take 1 tablet (125 mcg total) by mouth daily before breakfast.  Dispense: 90 tablet; Refill: 1   2. Adult attention deficit disorder Continue current dose of Adderall, 3 months of refills sent to pharmacy, follow-up visit in 3 months for additional refills. - amphetamine-dextroamphetamine (ADDERALL XR) 20 MG 24 hr capsule; Take 1 capsule (20 mg total) by mouth every morning.  Dispense: 30 capsule; Refill: 0 - amphetamine-dextroamphetamine (ADDERALL XR) 20 MG 24 hr capsule; Take 1 capsule (20 mg total) by mouth every morning.  Dispense: 30 capsule; Refill: 0 - amphetamine-dextroamphetamine (ADDERALL XR) 20 MG 24 hr capsule; Take 1 capsule (20 mg total) by mouth every morning.  Dispense: 30 capsule; Refill: 0 - amphetamine-dextroamphetamine (ADDERALL) 10 MG tablet; Take 1 tablet (10 mg total) by mouth daily as needed (ADHD symptoms).  Dispense: 90 tablet; Refill: 0   3.  Generalized anxiety disorder Stable, refills ordered.  - ALPRAZolam (XANAX) 0.5 MG tablet; Take 1-2 tablets (0.5-1 mg total) by mouth at bedtime as needed for anxiety. Take 1-2 tablets as needed at bedtime.  Dispense: 60 tablet; Refill: 2 - FLUoxetine (PROZAC) 20 MG tablet; Take 1 tablet (20 mg total) by mouth daily.  Dispense: 90 tablet; Refill: 1  4. Adjustment insomnia Stable refills ordered - ALPRAZolam (XANAX) 0.5 MG tablet; Take 1-2 tablets (0.5-1 mg total) by mouth at bedtime as needed for anxiety. Take 1-2 tablets as needed at bedtime.  Dispense: 60 tablet; Refill: 2  General Counseling: Dalaina verbalizes understanding of the findings of todays visit and agrees with plan of treatment. I have discussed any further diagnostic evaluation that may be needed or ordered today. We also reviewed her medications today. she has been encouraged to call the office with any questions or concerns that should arise related to todays visit.    Orders Placed This Encounter  Procedures   TSH + free T4    Meds ordered this encounter  Medications   amphetamine-dextroamphetamine (ADDERALL XR) 20 MG 24 hr capsule    Sig: Take 1 capsule (20 mg total) by mouth every morning.    Dispense:  30 capsule    Refill:  0    Please fill for june   amphetamine-dextroamphetamine (ADDERALL XR) 20 MG 24 hr capsule    Sig: Take 1 capsule (20 mg total) by mouth every morning.    Dispense:  30 capsule    Refill:  0    Please fill for july   amphetamine-dextroamphetamine (ADDERALL XR) 20 MG 24 hr capsule    Sig: Take 1 capsule (20 mg total) by mouth every morning.    Dispense:  30 capsule    Refill:  0    Please fill for august   amphetamine-dextroamphetamine (ADDERALL) 10 MG tablet    Sig: Take 1 tablet (10 mg total) by mouth daily as needed (ADHD symptoms).    Dispense:  90 tablet    Refill:  0    3 month supply of extra additional dose to be taken after taking her 20 mg XR adderall dose   FLUoxetine  (PROZAC) 20 MG tablet    Sig: Take 1 tablet (20 mg total) by mouth daily.    Dispense:  90 tablet    Refill:  1    Return in about 3 months (around 05/10/2022) for F/U, ADHD med check, [place] PCP.   Total time spent:30 Minutes Time spent includes review of chart, medications, test results, and follow up plan with the patient.   [place] Controlled Substance Database was reviewed by me.  This patient was seen by Jonetta Osgood, FNP-C in collaboration with Dr. Clayborn Bigness as a part of collaborative care agreement.   Larri Yehle R. Valetta Fuller, MSN, FNP-C Internal medicine

## 2022-03-03 ENCOUNTER — Telehealth: Payer: BC Managed Care – PPO | Admitting: Nurse Practitioner

## 2022-03-03 DIAGNOSIS — J0110 Acute frontal sinusitis, unspecified: Secondary | ICD-10-CM

## 2022-03-03 MED ORDER — DOXYCYCLINE HYCLATE 100 MG PO TABS: 100.0000 mg | ORAL_TABLET | Freq: Two times a day (BID) | ORAL | 0 refills | Status: DC

## 2022-03-03 NOTE — Progress Notes (Signed)
[facility] [address] [place], [place] [address]  Internal MEDICINE  Telephone Visit  Patient Name: Yesenia Ward  [id number]  [id number]  Date of Service: 03/03/2022  I connected with the patient at 4:55 PM by telephone and verified the patients identity using two identifiers.   I discussed the limitations, risks, security and privacy concerns of performing an evaluation and management service by telephone and the availability of in person appointments. I also discussed with the patient that there may be a patient responsible charge related to the service.  The patient expressed understanding and agrees to proceed.    Chief Complaint  Patient presents with   Telephone Assessment    [id number]    Telephone Screen   Cough   Sinus Problem   Sore Throat    HPI Yesenia Ward presents for a telehealth virtual visit for symptoms of sinusitis. She confirms a cough and sore throat.  She also reports fatigue, nasal congestion, runny nose sinus pain and pressure, headache, dizziness, ear pressure, body aches and back pain and slight nausea.   Current Medication: Outpatient Encounter Medications as of 03/03/2022  Medication Sig   acetaminophen (TYLENOL) 500 MG tablet Take 500 mg by mouth every 6 (six) hours as needed.   ALPRAZolam (XANAX) 0.5 MG tablet Take 1-2 tablets (0.5-1 mg total) by mouth at bedtime as needed for anxiety. Take 1-2 tablets as needed at bedtime.   amphetamine-dextroamphetamine (ADDERALL XR) 20 MG 24 hr capsule Take 1 capsule (20 mg total) by mouth every morning.   amphetamine-dextroamphetamine (ADDERALL XR) 20 MG 24 hr capsule Take 1 capsule (20 mg total) by mouth every morning.   amphetamine-dextroamphetamine (ADDERALL XR) 20 MG 24 hr capsule Take 1 capsule (20 mg total) by mouth every morning.   [START ON 03/07/2022] amphetamine-dextroamphetamine (ADDERALL XR) 20 MG 24 hr capsule Take 1 capsule (20 mg total) by mouth every morning.   [START ON 04/04/2022]  amphetamine-dextroamphetamine (ADDERALL XR) 20 MG 24 hr capsule Take 1 capsule (20 mg total) by mouth every morning.   amphetamine-dextroamphetamine (ADDERALL) 10 MG tablet Take 1 tablet (10 mg total) by mouth daily as needed (ADHD symptoms).   azelastine (ASTELIN) 0.1 % nasal spray Place 2 sprays into both nostrils 2 (two) times daily. Use in each nostril as directed   b complex vitamins capsule Take 1 capsule by mouth daily.   doxycycline (VIBRA-TABS) 100 MG tablet Take 1 tablet (100 mg total) by mouth 2 (two) times daily. Take with food.   esomeprazole (NEXIUM) 40 MG capsule TAKE 1 CAPSULE TWICE A DAY BEFORE MEALS   Estradiol-Progesterone 1-100 MG CAPS Take by mouth.   Fexofenadine-Pseudoephedrine (ALLEGRA-D 12 HOUR PO) Take by mouth.   FLUoxetine (PROZAC) 20 MG tablet Take 1 tablet (20 mg total) by mouth daily.   imatinib (GLEEVEC) 100 MG tablet Take 100 mg by mouth 2 (two) times daily. Take with meals and large glass of water.Caution:Chemotherapy   levothyroxine (SYNTHROID) 125 MCG tablet Take 1 tablet (125 mcg total) by mouth daily before breakfast.   methylPREDNISolone (MEDROL DOSEPAK) 4 MG TBPK tablet Take by mouth.   ondansetron (ZOFRAN) 4 MG tablet TAKE 1 TABLET(4 MG) BY MOUTH EVERY 8 HOURS AS NEEDED FOR NAUSEA OR VOMITING   Oxymetazoline HCl (UPNEEQ) 0.1 % SOLN Apply to eye.   No facility-administered encounter medications on file as of 03/03/2022.    Surgical History: Past Surgical History:  Procedure Laterality Date   AUGMENTATION MAMMAPLASTY Bilateral 2005   gist tumor remooval  OVARIAN CYST REMOVAL Left    TUBAL LIGATION      Medical History: Past Medical History:  Diagnosis Date   GIST (gastrointestinal stromal tumor), non-malignant    Hypothyroid    Tumor     Family History: Family History  Problem Relation Age of Onset   Diabetes Mother    Coronary artery disease Mother    Cirrhosis Father    Cancer Maternal Uncle    Rectal cancer Paternal Aunt    Cancer  Paternal Aunt     Social History   Socioeconomic History   Marital status: Married    Spouse name: Not on file   Number of children: Not on file   Years of education: Not on file   Highest education level: Not on file  Occupational History   Not on file  Tobacco Use   Smoking status: Former   Smokeless tobacco: Never  Vaping Use   Vaping Use: Never used  Substance and Sexual Activity   Alcohol use: Yes    Comment: occasionally   Drug use: Never   Sexual activity: Not on file  Other Topics Concern   Not on file  Social History Narrative   Not on file   Social Determinants of Health   Financial Resource Strain: Not on file  Food Insecurity: Not on file  Transportation Needs: Not on file  Physical Activity: Not on file  Stress: Not on file  Social Connections: Not on file  Intimate Partner Violence: Not on file      Review of Systems  Constitutional:  Positive for fatigue. Negative for appetite change, chills and fever.  HENT:  Positive for congestion, postnasal drip, rhinorrhea, sinus pressure, sinus pain, sneezing and sore throat.   Respiratory:  Positive for cough. Negative for chest tightness, shortness of breath and wheezing.   Cardiovascular: Negative.  Negative for chest pain and palpitations.  Gastrointestinal:  Positive for nausea. Negative for abdominal pain, constipation, diarrhea and vomiting.  Musculoskeletal:  Positive for back pain and myalgias.  Neurological:  Positive for dizziness and headaches.    Vital Signs: There were no vitals taken for this visit.   Observation/Objective: She is alert and oriented and engages in conversation appropriately.  She does not sound as though she is in any acute distress over telephone call.    Assessment/Plan: 1. Acute non-recurrent frontal sinusitis Empiric antibiotic treatment prescribed. - doxycycline (VIBRA-TABS) 100 MG tablet; Take 1 tablet (100 mg total) by mouth 2 (two) times daily. Take with food.   Dispense: 20 tablet; Refill: 0   General Counseling: Yesenia Ward verbalizes understanding of the findings of today's phone visit and agrees with plan of treatment. I have discussed any further diagnostic evaluation that may be needed or ordered today. We also reviewed her medications today. she has been encouraged to call the office with any questions or concerns that should arise related to todays visit.  Return if symptoms worsen or fail to improve.   No orders of the defined types were placed in this encounter.   Meds ordered this encounter  Medications   doxycycline (VIBRA-TABS) 100 MG tablet    Sig: Take 1 tablet (100 mg total) by mouth 2 (two) times daily. Take with food.    Dispense:  20 tablet    Refill:  0    Time spent:10 Minutes Time spent with patient included reviewing progress notes, labs, imaging studies, and discussing plan for follow up.  [place] Controlled Substance Database was reviewed by me for  overdose risk score (ORS) if appropriate.  This patient was seen by Jonetta Osgood, FNP-C in collaboration with Dr. Clayborn Bigness as a part of collaborative care agreement.  Aldyn Toon R. Valetta Fuller, MSN, FNP-C Internal medicine

## 2022-03-04 ENCOUNTER — Encounter: Payer: Self-pay | Admitting: Nurse Practitioner

## 2022-03-04 ENCOUNTER — Telehealth: Payer: Self-pay

## 2022-03-04 NOTE — Telephone Encounter (Signed)
Work note emailed to patient. Sent to be scanned-Toni

## 2022-03-16 DIAGNOSIS — Z124 Encounter for screening for malignant neoplasm of cervix: Secondary | ICD-10-CM | POA: Diagnosis not present

## 2022-03-16 DIAGNOSIS — Z1231 Encounter for screening mammogram for malignant neoplasm of breast: Secondary | ICD-10-CM | POA: Diagnosis not present

## 2022-03-16 DIAGNOSIS — Z01419 Encounter for gynecological examination (general) (routine) without abnormal findings: Secondary | ICD-10-CM | POA: Diagnosis not present

## 2022-04-13 DIAGNOSIS — C49A0 Gastrointestinal stromal tumor, unspecified site: Secondary | ICD-10-CM | POA: Diagnosis not present

## 2022-04-13 DIAGNOSIS — K909 Intestinal malabsorption, unspecified: Secondary | ICD-10-CM | POA: Diagnosis not present

## 2022-04-13 DIAGNOSIS — K639 Disease of intestine, unspecified: Secondary | ICD-10-CM | POA: Diagnosis not present

## 2022-04-21 ENCOUNTER — Other Ambulatory Visit: Payer: Self-pay

## 2022-04-21 DIAGNOSIS — D490 Neoplasm of unspecified behavior of digestive system: Secondary | ICD-10-CM | POA: Diagnosis not present

## 2022-04-21 DIAGNOSIS — Z1331 Encounter for screening for depression: Secondary | ICD-10-CM | POA: Diagnosis not present

## 2022-04-26 ENCOUNTER — Encounter: Payer: BC Managed Care – PPO | Admitting: Nurse Practitioner

## 2022-04-27 ENCOUNTER — Encounter: Payer: Self-pay | Admitting: Nurse Practitioner

## 2022-05-09 ENCOUNTER — Ambulatory Visit
Admission: RE | Admit: 2022-05-09 | Discharge: 2022-05-09 | Disposition: A | Discharge status: Home / Self Care | Payer: BC Managed Care – PPO | Source: Ambulatory Visit | Attending: Nurse Practitioner | Admitting: Nurse Practitioner

## 2022-05-09 DIAGNOSIS — Z1231 Encounter for screening mammogram for malignant neoplasm of breast: Secondary | ICD-10-CM | POA: Insufficient documentation

## 2022-05-11 ENCOUNTER — Other Ambulatory Visit: Payer: Self-pay | Admitting: Nurse Practitioner

## 2022-05-11 DIAGNOSIS — R928 Other abnormal and inconclusive findings on diagnostic imaging of breast: Secondary | ICD-10-CM

## 2022-05-12 ENCOUNTER — Telehealth: Payer: Self-pay | Admitting: Nurse Practitioner

## 2022-05-12 NOTE — Telephone Encounter (Signed)
Patient dropped off FMLA paperwork..given to [email or web address]

## 2022-05-16 ENCOUNTER — Encounter: Payer: Self-pay | Admitting: Nurse Practitioner

## 2022-05-16 ENCOUNTER — Ambulatory Visit (INDEPENDENT_AMBULATORY_CARE_PROVIDER_SITE_OTHER): Payer: BC Managed Care – PPO | Admitting: Nurse Practitioner

## 2022-05-16 VITALS — BP 133/88 | HR 98 | Temp 97.7°F | Resp 16 | Ht 63.0 in | Wt 129.2 lb

## 2022-05-16 DIAGNOSIS — Z79899 Other long term (current) drug therapy: Secondary | ICD-10-CM

## 2022-05-16 DIAGNOSIS — F988 Other specified behavioral and emotional disorders with onset usually occurring in childhood and adolescence: Secondary | ICD-10-CM

## 2022-05-16 DIAGNOSIS — M151 Heberden's nodes (with arthropathy): Secondary | ICD-10-CM

## 2022-05-16 DIAGNOSIS — Z862 Personal history of diseases of the blood and blood-forming organs and certain disorders involving the immune mechanism: Secondary | ICD-10-CM

## 2022-05-16 DIAGNOSIS — R928 Other abnormal and inconclusive findings on diagnostic imaging of breast: Secondary | ICD-10-CM | POA: Diagnosis not present

## 2022-05-16 DIAGNOSIS — F5102 Adjustment insomnia: Secondary | ICD-10-CM

## 2022-05-16 DIAGNOSIS — F411 Generalized anxiety disorder: Secondary | ICD-10-CM

## 2022-05-16 DIAGNOSIS — E039 Hypothyroidism, unspecified: Secondary | ICD-10-CM | POA: Diagnosis not present

## 2022-05-16 DIAGNOSIS — L659 Nonscarring hair loss, unspecified: Secondary | ICD-10-CM | POA: Diagnosis not present

## 2022-05-16 DIAGNOSIS — R300 Dysuria: Secondary | ICD-10-CM | POA: Diagnosis not present

## 2022-05-16 DIAGNOSIS — Z0001 Encounter for general adult medical examination with abnormal findings: Secondary | ICD-10-CM | POA: Diagnosis not present

## 2022-05-16 DIAGNOSIS — Z760 Encounter for issue of repeat prescription: Secondary | ICD-10-CM

## 2022-05-16 DIAGNOSIS — E559 Vitamin D deficiency, unspecified: Secondary | ICD-10-CM

## 2022-05-16 DIAGNOSIS — E7889 Other lipoprotein metabolism disorders: Secondary | ICD-10-CM

## 2022-05-16 LAB — POCT URINE DRUG SCREEN
Methylenedioxyamphetamine: NOT DETECTED
POC Amphetamine UR: POSITIVE — AB
POC BENZODIAZEPINES UR: POSITIVE — AB
POC Barbiturate UR: NOT DETECTED
POC Cocaine UR: NOT DETECTED
POC Ecstasy UR: NOT DETECTED
POC Marijuana UR: NOT DETECTED
POC Methadone UR: NOT DETECTED
POC Methamphetamine UR: NOT DETECTED
POC Opiate Ur: NOT DETECTED
POC Oxycodone UR: NOT DETECTED
POC PHENCYCLIDINE UR: NOT DETECTED
POC TRICYCLICS UR: NOT DETECTED

## 2022-05-16 MED ORDER — AMPHETAMINE-DEXTROAMPHET ER 20 MG PO CP24: 20.0000 mg | ORAL_CAPSULE | ORAL | 0 refills | Status: DC

## 2022-05-16 MED ORDER — AMPHETAMINE-DEXTROAMPHETAMINE 10 MG PO TABS: 10.0000 mg | ORAL_TABLET | Freq: Every day | ORAL | 0 refills | Status: DC | PRN

## 2022-05-16 MED ORDER — ALPRAZOLAM 0.5 MG PO TABS: 0.5000 mg | ORAL_TABLET | Freq: Every evening | ORAL | 2 refills | Status: DC | PRN

## 2022-05-16 NOTE — Progress Notes (Signed)
[facility] [place], [place] [address]  Internal MEDICINE  Office Visit Note  Patient Name: Yesenia Ward  [id number]  [id number]  Date of Service: 05/16/2022  Chief Complaint  Patient presents with   Annual Exam    HPI Yesenia Ward presents for an annual well visit and physical exam.  Well-appearing 56 year old female with hypothyroidism, GIST tumor s/p surgical removal, GERD, insomnia, iron deficiency anemia, GAD, low vitamin D and ADD.  --sees OBGYN, Dr. Leafy Ro at [facility] clinic for women's health -- paps and mammo.  --routine colonoscopy due in 2029.  --denies any pain except some left sided hip pain and sciatica --may need to go to [place] for imaging and possibly surgery to remove a new GIST tumor in her abdomen that was found. --still taking Gleevec --need refills of adderall and alprazolam --no other concerns.    Current Medication: Outpatient Encounter Medications as of 05/16/2022  Medication Sig   acetaminophen (TYLENOL) 500 MG tablet Take 500 mg by mouth every 6 (six) hours as needed.   azelastine (ASTELIN) 0.1 % nasal spray Place 2 sprays into both nostrils 2 (two) times daily. Use in each nostril as directed   b complex vitamins capsule Take 1 capsule by mouth daily.   doxycycline (VIBRA-TABS) 100 MG tablet Take 1 tablet (100 mg total) by mouth 2 (two) times daily. Take with food.   esomeprazole (NEXIUM) 40 MG capsule TAKE 1 CAPSULE TWICE A DAY BEFORE MEALS   Estradiol-Progesterone 1-100 MG CAPS Take by mouth.   Fexofenadine-Pseudoephedrine (ALLEGRA-D 12 HOUR PO) Take by mouth.   FLUoxetine (PROZAC) 20 MG tablet Take 1 tablet (20 mg total) by mouth daily.   imatinib (GLEEVEC) 100 MG tablet Take 100 mg by mouth 2 (two) times daily. Take with meals and large glass of water.Caution:Chemotherapy   levothyroxine (SYNTHROID) 125 MCG tablet Take 1 tablet (125 mcg total) by mouth daily before breakfast.   methylPREDNISolone (MEDROL DOSEPAK) 4 MG  TBPK tablet Take by mouth.   ondansetron (ZOFRAN) 4 MG tablet TAKE 1 TABLET(4 MG) BY MOUTH EVERY 8 HOURS AS NEEDED FOR NAUSEA OR VOMITING   Oxymetazoline HCl (UPNEEQ) 0.1 % SOLN Apply to eye.   [DISCONTINUED] ALPRAZolam (XANAX) 0.5 MG tablet Take 1-2 tablets (0.5-1 mg total) by mouth at bedtime as needed for anxiety. Take 1-2 tablets as needed at bedtime.   [DISCONTINUED] amphetamine-dextroamphetamine (ADDERALL XR) 20 MG 24 hr capsule Take 1 capsule (20 mg total) by mouth every morning.   [DISCONTINUED] amphetamine-dextroamphetamine (ADDERALL XR) 20 MG 24 hr capsule Take 1 capsule (20 mg total) by mouth every morning.   [DISCONTINUED] amphetamine-dextroamphetamine (ADDERALL XR) 20 MG 24 hr capsule Take 1 capsule (20 mg total) by mouth every morning.   [DISCONTINUED] amphetamine-dextroamphetamine (ADDERALL XR) 20 MG 24 hr capsule Take 1 capsule (20 mg total) by mouth every morning.   [DISCONTINUED] amphetamine-dextroamphetamine (ADDERALL XR) 20 MG 24 hr capsule Take 1 capsule (20 mg total) by mouth every morning.   [DISCONTINUED] amphetamine-dextroamphetamine (ADDERALL) 10 MG tablet Take 1 tablet (10 mg total) by mouth daily as needed (ADHD symptoms).   ALPRAZolam (XANAX) 0.5 MG tablet Take 1-2 tablets (0.5-1 mg total) by mouth at bedtime as needed for anxiety. Take 1-2 tablets as needed at bedtime.   amphetamine-dextroamphetamine (ADDERALL XR) 20 MG 24 hr capsule Take 1 capsule (20 mg total) by mouth every morning.   amphetamine-dextroamphetamine (ADDERALL XR) 20 MG 24 hr capsule Take 1 capsule (20 mg total) by mouth every  morning.   amphetamine-dextroamphetamine (ADDERALL XR) 20 MG 24 hr capsule Take 1 capsule (20 mg total) by mouth every morning.   amphetamine-dextroamphetamine (ADDERALL) 10 MG tablet Take 1 tablet (10 mg total) by mouth daily as needed (ADHD symptoms).   No facility-administered encounter medications on file as of 05/16/2022.    Surgical History: Past Surgical History:   Procedure Laterality Date   AUGMENTATION MAMMAPLASTY Bilateral 2005   gist tumor remooval     OVARIAN CYST REMOVAL Left    TUBAL LIGATION      Medical History: Past Medical History:  Diagnosis Date   GIST (gastrointestinal stromal tumor), non-malignant    Hypothyroid    Tumor     Family History: Family History  Problem Relation Age of Onset   Diabetes Mother    Coronary artery disease Mother    Cirrhosis Father    Cancer Maternal Uncle    Rectal cancer Paternal Aunt    Cancer Paternal Aunt     Social History   Socioeconomic History   Marital status: Married    Spouse name: Not on file   Number of children: Not on file   Years of education: Not on file   Highest education level: Not on file  Occupational History   Not on file  Tobacco Use   Smoking status: Former   Smokeless tobacco: Never  Vaping Use   Vaping Use: Never used  Substance and Sexual Activity   Alcohol use: Yes    Comment: occasionally   Drug use: Never   Sexual activity: Not on file  Other Topics Concern   Not on file  Social History Narrative   Not on file   Social Determinants of Health   Financial Resource Strain: Not on file  Food Insecurity: Not on file  Transportation Needs: Not on file  Physical Activity: Not on file  Stress: Not on file  Social Connections: Not on file  Intimate Partner Violence: Not on file      Review of Systems  Constitutional:  Negative for activity change, appetite change, chills, fatigue, fever and unexpected weight change.  HENT: Negative.  Negative for congestion, ear pain, rhinorrhea, sore throat and trouble swallowing.   Eyes: Negative.   Respiratory: Negative.  Negative for cough, chest tightness, shortness of breath and wheezing.   Cardiovascular: Negative.  Negative for chest pain and palpitations.  Gastrointestinal:  Positive for nausea (has prescription for zofran because Gleevec makes her nauseated.). Negative for abdominal pain, blood in  stool, constipation, diarrhea and vomiting.       GIST tumor removed and is on [place], gets yearly CT abdomen to assess for new tumors or mets.   Endocrine: Negative.   Genitourinary: Negative.  Negative for difficulty urinating, dysuria, frequency, hematuria and urgency.  Musculoskeletal: Negative.  Negative for arthralgias, back pain, joint swelling, myalgias and neck pain.  Skin: Negative.  Negative for rash and wound.  Allergic/Immunologic: Negative.  Negative for immunocompromised state.  Neurological: Negative.  Negative for dizziness, seizures, light-headedness, numbness and headaches.  Hematological: Negative.   Psychiatric/Behavioral:  Positive for decreased concentration (ADD symptoms controlled with adderall XR) and sleep disturbance (takes alprazolam for sleep as needed.). Negative for behavioral problems, self-injury and suicidal ideas. The patient is nervous/anxious.     Vital Signs: BP 133/88   Pulse 98   Temp 97.7 F (36.5 C)   Resp 16   Ht '5\' 3"'  (1.6 m)   Wt 129 lb 3.2 oz (58.6 kg)   SpO2  97%   BMI 22.89 kg/m    Physical Exam Constitutional:      General: She is awake. She is not in acute distress.    Appearance: Normal appearance. She is well-developed, well-groomed and normal weight. She is not ill-appearing or diaphoretic.  HENT:     Head: Normocephalic and atraumatic.     Right Ear: Tympanic membrane, ear canal and external ear normal.     Left Ear: Tympanic membrane, ear canal and external ear normal.     Nose: Nose normal. No congestion or rhinorrhea.     Mouth/Throat:     Lips: Pink.     Mouth: Mucous membranes are moist.     Pharynx: Oropharynx is clear. Uvula midline. No oropharyngeal exudate or posterior oropharyngeal erythema.  Eyes:     General: Lids are normal. Vision grossly intact. Gaze aligned appropriately. No scleral icterus.       Right eye: No discharge.        Left eye: No discharge.     Extraocular Movements: Extraocular movements  intact.     Conjunctiva/sclera: Conjunctivae normal.     Pupils: Pupils are equal, round, and reactive to light.     Funduscopic exam:    Right eye: Red reflex present.        Left eye: Red reflex present. Neck:     Thyroid: No thyromegaly.     Vascular: No JVD.     Trachea: Trachea and phonation normal. No tracheal deviation.  Cardiovascular:     Rate and Rhythm: Normal rate and regular rhythm.     Pulses:          Carotid pulses are 3+ on the right side and 3+ on the left side.      Radial pulses are 2+ on the right side and 2+ on the left side.       Posterior tibial pulses are 1+ on the right side and 1+ on the left side.     Heart sounds: Normal heart sounds, S1 normal and S2 normal. No murmur heard.    No friction rub. No gallop.  Pulmonary:     Effort: Pulmonary effort is normal. No accessory muscle usage or respiratory distress.     Breath sounds: Normal breath sounds and air entry. No stridor. No wheezing or rales.  Chest:     Chest wall: No tenderness.     Comments: Declined clinical breast exam, will schedule mammogram.  Abdominal:     General: Bowel sounds are normal. There is no distension.     Palpations: Abdomen is soft. There is no mass.     Tenderness: There is no abdominal tenderness. There is no guarding or rebound.  Musculoskeletal:        General: No tenderness or deformity. Normal range of motion.     Cervical back: Normal range of motion and neck supple.     Right lower leg: No edema.     Left lower leg: No edema.  Lymphadenopathy:     Cervical: No cervical adenopathy.  Skin:    General: Skin is warm and dry.     Capillary Refill: Capillary refill takes less than 2 seconds.     Coloration: Skin is not pale.     Findings: No erythema or rash.  Neurological:     Mental Status: She is alert and oriented to person, place, and time.     Cranial Nerves: No cranial nerve deficit.     Motor: No abnormal muscle  tone.     Coordination: Coordination normal.      Gait: Gait normal.     Deep Tendon Reflexes: Reflexes are normal and symmetric.  Psychiatric:        Attention and Perception: Attention normal.        Mood and Affect: Mood and affect normal.        Behavior: Behavior normal. Behavior is cooperative.        Thought Content: Thought content normal.        Judgment: Judgment normal.       Assessment/Plan: 1. Encounter for routine adult health examination with abnormal findings Age-appropriate preventive screenings and vaccinations discussed, annual physical exam completed. Routine labs for health maintenance ordered, see below. PHM updated. May need paperwork filled out for FMLA soon.  - Lipid Profile - Vitamin D (25 hydroxy) - CMP14+EGFR - B12 and Folate Panel - CBC with Differential/Platelet - TSH + free T4  2. Abnormality of right breast on screening mammogram Prior abnormal mammogram, due for repeat, orders placed - MM DIAG BREAST TOMO UNI RIGHT; Future - US BREAST LTD UNI RIGHT INC AXILLA; Future  3. Osteoarthritis of distal interphalangeal (DIP) joint of right index finger Labs ordered to rule out autoimmune or rheumatoid conditions - Rheumatoid Factor - ANA Direct w/Reflex if Positive - Sed Rate (ESR) - C-reactive protein  4. Acquired hypothyroidism Routine labs ordered - Lipid Profile - CMP14+EGFR - TSH + free T4  5. Hair loss disorder Routine labs ordered - Vitamin D (25 hydroxy) - CMP14+EGFR - B12 and Folate Panel - TSH + free T4  6. Vitamin D deficiency Routine lab ordered - Vitamin D (25 hydroxy)  7. History of iron deficiency anemia Routine labs ordered - B12 and Folate Panel - CBC with Differential/Platelet  8. Encounter for long-term (current) use of medications UDS positive for benzo and amphetamine which is consistent with her current prescriptions, repeat UDS in 6 months - POCT Urine Drug Screen  9. Dysuria Routine urinalysis done - UA/M w/rflx Culture, Routine - Microscopic  Examination  10. Medication refill - ALPRAZolam (XANAX) 0.5 MG tablet; Take 1-2 tablets (0.5-1 mg total) by mouth at bedtime as needed for anxiety. Take 1-2 tablets as needed at bedtime.  Dispense: 60 tablet; Refill: 2 - amphetamine-dextroamphetamine (ADDERALL XR) 20 MG 24 hr capsule; Take 1 capsule (20 mg total) by mouth every morning.  Dispense: 30 capsule; Refill: 0 - amphetamine-dextroamphetamine (ADDERALL XR) 20 MG 24 hr capsule; Take 1 capsule (20 mg total) by mouth every morning.  Dispense: 30 capsule; Refill: 0 - amphetamine-dextroamphetamine (ADDERALL XR) 20 MG 24 hr capsule; Take 1 capsule (20 mg total) by mouth every morning.  Dispense: 30 capsule; Refill: 0 - amphetamine-dextroamphetamine (ADDERALL) 10 MG tablet; Take 1 tablet (10 mg total) by mouth daily as needed (ADHD symptoms).  Dispense: 90 tablet; Refill: 0    General Counseling: Edwinna verbalizes understanding of the findings of todays visit and agrees with plan of treatment. I have discussed any further diagnostic evaluation that may be needed or ordered today. We also reviewed her medications today. she has been encouraged to call the office with any questions or concerns that should arise related to todays visit.    Orders Placed This Encounter  Procedures   MM DIAG BREAST TOMO UNI RIGHT   US BREAST LTD UNI RIGHT INC AXILLA   UA/M w/rflx Culture, Routine   Lipid Profile   Vitamin D (25 hydroxy)   CMP14+EGFR   B12 and Folate  Panel   CBC with Differential/Platelet   TSH + free T4   Rheumatoid Factor   ANA Direct w/Reflex if Positive   Sed Rate (ESR)   C-reactive protein   POCT Urine Drug Screen    Meds ordered this encounter  Medications   ALPRAZolam (XANAX) 0.5 MG tablet    Sig: Take 1-2 tablets (0.5-1 mg total) by mouth at bedtime as needed for anxiety. Take 1-2 tablets as needed at bedtime.    Dispense:  60 tablet    Refill:  2   amphetamine-dextroamphetamine (ADDERALL XR) 20 MG 24 hr capsule    Sig: Take  1 capsule (20 mg total) by mouth every morning.    Dispense:  30 capsule    Refill:  0    Please fill for november   amphetamine-dextroamphetamine (ADDERALL XR) 20 MG 24 hr capsule    Sig: Take 1 capsule (20 mg total) by mouth every morning.    Dispense:  30 capsule    Refill:  0    Please fill for october   amphetamine-dextroamphetamine (ADDERALL XR) 20 MG 24 hr capsule    Sig: Take 1 capsule (20 mg total) by mouth every morning.    Dispense:  30 capsule    Refill:  0    Please fill for september   amphetamine-dextroamphetamine (ADDERALL) 10 MG tablet    Sig: Take 1 tablet (10 mg total) by mouth daily as needed (ADHD symptoms).    Dispense:  90 tablet    Refill:  0    3 month supply of extra additional dose to be taken after taking her 20 mg XR adderall dose    Return in about 3 months (around 08/15/2022) for F/U, ADHD med check, Minnetta Sandora PCP, anxiety med refill.   Total time spent:30 Minutes Time spent includes review of chart, medications, test results, and follow up plan with the patient.   [place] Controlled Substance Database was reviewed by me.  This patient was seen by Jonetta Osgood, FNP-C in collaboration with Dr. Clayborn Bigness as a part of collaborative care agreement.  Layla Kesling R. Valetta Fuller, MSN, FNP-C Internal medicine

## 2022-05-17 LAB — UA/M W/RFLX CULTURE, ROUTINE
Bilirubin, UA: NEGATIVE
Glucose, UA: NEGATIVE
Leukocytes,UA: NEGATIVE
Nitrite, UA: NEGATIVE
Protein,UA: NEGATIVE
RBC, UA: NEGATIVE
Specific Gravity, UA: 1.029 (ref 1.005–1.030)
Urobilinogen, Ur: 0.2 mg/dL (ref 0.2–1.0)
pH, UA: 5.5 (ref 5.0–7.5)

## 2022-05-17 LAB — MICROSCOPIC EXAMINATION
Casts: NONE SEEN /lpf
RBC, Urine: NONE SEEN /hpf (ref 0–2)

## 2022-05-19 DIAGNOSIS — Z8500 Personal history of malignant neoplasm of unspecified digestive organ: Secondary | ICD-10-CM | POA: Diagnosis not present

## 2022-05-19 DIAGNOSIS — Z9882 Breast implant status: Secondary | ICD-10-CM | POA: Diagnosis not present

## 2022-05-19 DIAGNOSIS — R1909 Other intra-abdominal and pelvic swelling, mass and lump: Secondary | ICD-10-CM | POA: Diagnosis not present

## 2022-05-19 DIAGNOSIS — Z08 Encounter for follow-up examination after completed treatment for malignant neoplasm: Secondary | ICD-10-CM | POA: Diagnosis not present

## 2022-05-19 DIAGNOSIS — C49A0 Gastrointestinal stromal tumor, unspecified site: Secondary | ICD-10-CM | POA: Diagnosis not present

## 2022-05-23 ENCOUNTER — Other Ambulatory Visit: Payer: Self-pay | Admitting: Nurse Practitioner

## 2022-05-23 DIAGNOSIS — E039 Hypothyroidism, unspecified: Secondary | ICD-10-CM

## 2022-05-24 DIAGNOSIS — D490 Neoplasm of unspecified behavior of digestive system: Secondary | ICD-10-CM | POA: Diagnosis not present

## 2022-05-30 ENCOUNTER — Encounter: Payer: Self-pay | Admitting: Physician Assistant

## 2022-05-30 ENCOUNTER — Telehealth: Payer: Self-pay

## 2022-05-30 ENCOUNTER — Ambulatory Visit: Payer: BC Managed Care – PPO | Admitting: Physician Assistant

## 2022-05-30 VITALS — BP 120/95 | HR 94 | Temp 96.8°F | Resp 16 | Ht 63.0 in | Wt 131.4 lb

## 2022-05-30 DIAGNOSIS — L237 Allergic contact dermatitis due to plants, except food: Secondary | ICD-10-CM

## 2022-05-30 MED ORDER — PREDNISONE 10 MG PO TABS: ORAL_TABLET | ORAL | 0 refills | Status: DC

## 2022-05-30 NOTE — Progress Notes (Signed)
[facility] [place], [place] [address]  Internal MEDICINE  Office Visit Note  Patient Name: Yesenia Ward  [id number]  [id number]  Date of Service: 05/30/2022  Chief Complaint  Patient presents with   Acute Visit    Poison ivy      HPI Pt is here for a sick visit. -She is allergic to poison ivy and is having current reaction. Probably encountered it in on Saturday. States she was gathering wood and did have one glove on, but thinks she must have gotten into some without realizing. [place] and has it on her property. She has had poison ivy several times previously. -On her face, neck, chest and arms. She is using calamine. -Will give prednisone and advised to take benadryl and may continue topical as needed   Current Medication:  Outpatient Encounter Medications as of 05/30/2022  Medication Sig   acetaminophen (TYLENOL) 500 MG tablet Take 500 mg by mouth every 6 (six) hours as needed.   ALPRAZolam (XANAX) 0.5 MG tablet Take 1-2 tablets (0.5-1 mg total) by mouth at bedtime as needed for anxiety. Take 1-2 tablets as needed at bedtime.   amphetamine-dextroamphetamine (ADDERALL XR) 20 MG 24 hr capsule Take 1 capsule (20 mg total) by mouth every morning.   amphetamine-dextroamphetamine (ADDERALL XR) 20 MG 24 hr capsule Take 1 capsule (20 mg total) by mouth every morning.   amphetamine-dextroamphetamine (ADDERALL XR) 20 MG 24 hr capsule Take 1 capsule (20 mg total) by mouth every morning.   amphetamine-dextroamphetamine (ADDERALL) 10 MG tablet Take 1 tablet (10 mg total) by mouth daily as needed (ADHD symptoms).   azelastine (ASTELIN) 0.1 % nasal spray Place 2 sprays into both nostrils 2 (two) times daily. Use in each nostril as directed   b complex vitamins capsule Take 1 capsule by mouth daily.   doxycycline (VIBRA-TABS) 100 MG tablet Take 1 tablet (100 mg total) by mouth 2 (two) times daily. Take with food.   esomeprazole (NEXIUM) 40 MG  capsule TAKE 1 CAPSULE TWICE A DAY BEFORE MEALS   Estradiol-Progesterone 1-100 MG CAPS Take by mouth.   Fexofenadine-Pseudoephedrine (ALLEGRA-D 12 HOUR PO) Take by mouth.   FLUoxetine (PROZAC) 20 MG tablet Take 1 tablet (20 mg total) by mouth daily.   imatinib (GLEEVEC) 100 MG tablet Take 100 mg by mouth 2 (two) times daily. Take with meals and large glass of water.Caution:Chemotherapy   levothyroxine (SYNTHROID) 125 MCG tablet TAKE 1 TABLET DAILY BEFORE BREAKFAST   methylPREDNISolone (MEDROL DOSEPAK) 4 MG TBPK tablet Take by mouth.   ondansetron (ZOFRAN) 4 MG tablet TAKE 1 TABLET(4 MG) BY MOUTH EVERY 8 HOURS AS NEEDED FOR NAUSEA OR VOMITING   Oxymetazoline HCl (UPNEEQ) 0.1 % SOLN Apply to eye.   predniSONE (DELTASONE) 10 MG tablet Use per dose pack   No facility-administered encounter medications on file as of 05/30/2022.      Medical History: Past Medical History:  Diagnosis Date   GIST (gastrointestinal stromal tumor), non-malignant    Hypothyroid    Tumor      Vital Signs: BP (!) 120/95   Pulse 94   Temp (!) 96.8 F (36 C)   Resp 16   Ht '5\' 3"'$  (1.6 m)   Wt 131 lb 6.4 oz (59.6 kg)   SpO2 97%   BMI 23.28 kg/m    Review of Systems  Constitutional:  Negative for fatigue and fever.  HENT:  Negative for congestion, mouth sores and postnasal drip.  Respiratory:  Negative for cough.   Cardiovascular:  Negative for chest pain.  Genitourinary:  Negative for flank pain.  Skin:  Positive for rash.  Psychiatric/Behavioral: Negative.      Physical Exam Vitals reviewed.  Constitutional:      General: She is not in acute distress.    Appearance: Normal appearance. She is normal weight. She is not ill-appearing.  HENT:     Head: Normocephalic and atraumatic.  Eyes:     Pupils: Pupils are equal, round, and reactive to light.  Cardiovascular:     Rate and Rhythm: Normal rate and regular rhythm.  Pulmonary:     Effort: Pulmonary effort is normal. No respiratory distress.   Skin:    Findings: Rash present.     Comments: Rash present along forehead, neck, and both arms  Neurological:     Mental Status: She is alert and oriented to person, place, and time.     Cranial Nerves: No cranial nerve deficit.     Coordination: Coordination normal.     Gait: Gait normal.  Psychiatric:        Mood and Affect: Mood normal.        Behavior: Behavior normal.       Assessment/Plan: 1. Poison ivy Will start on prednisone taper and may take benadryl and continue topicals as needed. Advised to be careful when outside in known areas of poison ivy and wear appropriate protection to avoid future reactions. - predniSONE (DELTASONE) 10 MG tablet; Use per dose pack  Dispense: 21 tablet; Refill: 0   General Counseling: Yesenia Ward verbalizes understanding of the findings of todays visit and agrees with plan of treatment. I have discussed any further diagnostic evaluation that may be needed or ordered today. We also reviewed her medications today. she has been encouraged to call the office with any questions or concerns that should arise related to todays visit.    Counseling:    No orders of the defined types were placed in this encounter.   Meds ordered this encounter  Medications   predniSONE (DELTASONE) 10 MG tablet    Sig: Use per dose pack    Dispense:  21 tablet    Refill:  0    Time spent:30 Minutes

## 2022-05-30 NOTE — Telephone Encounter (Signed)
Lmom to pt regarding that we can make her appt for poison ivy

## 2022-06-03 DIAGNOSIS — D490 Neoplasm of unspecified behavior of digestive system: Secondary | ICD-10-CM | POA: Diagnosis not present

## 2022-06-08 ENCOUNTER — Encounter: Payer: Self-pay | Admitting: Nurse Practitioner

## 2022-06-08 DIAGNOSIS — C49A3 Gastrointestinal stromal tumor of small intestine: Secondary | ICD-10-CM | POA: Diagnosis not present

## 2022-06-13 ENCOUNTER — Telehealth: Payer: Self-pay | Admitting: Nurse Practitioner

## 2022-06-13 NOTE — Telephone Encounter (Signed)
Can I have the paperwork

## 2022-06-15 NOTE — Telephone Encounter (Signed)
Okay thank u

## 2022-06-30 ENCOUNTER — Ambulatory Visit: Payer: BC Managed Care – PPO | Admitting: Nurse Practitioner

## 2022-07-05 DIAGNOSIS — C49A9 Gastrointestinal stromal tumor of other sites: Secondary | ICD-10-CM | POA: Diagnosis not present

## 2022-07-05 DIAGNOSIS — Z8509 Personal history of malignant neoplasm of other digestive organs: Secondary | ICD-10-CM | POA: Diagnosis not present

## 2022-07-07 ENCOUNTER — Encounter: Payer: Self-pay | Admitting: Nurse Practitioner

## 2022-07-29 ENCOUNTER — Other Ambulatory Visit: Payer: Self-pay | Admitting: Nurse Practitioner

## 2022-08-15 ENCOUNTER — Encounter: Payer: Self-pay | Admitting: Nurse Practitioner

## 2022-08-15 ENCOUNTER — Ambulatory Visit: Payer: BC Managed Care – PPO | Admitting: Nurse Practitioner

## 2022-08-15 VITALS — BP 120/70 | HR 100 | Temp 98.3°F | Resp 16 | Ht 63.0 in | Wt 129.6 lb

## 2022-08-15 DIAGNOSIS — R300 Dysuria: Secondary | ICD-10-CM | POA: Diagnosis not present

## 2022-08-15 DIAGNOSIS — F988 Other specified behavioral and emotional disorders with onset usually occurring in childhood and adolescence: Secondary | ICD-10-CM | POA: Diagnosis not present

## 2022-08-15 DIAGNOSIS — Z79899 Other long term (current) drug therapy: Secondary | ICD-10-CM | POA: Diagnosis not present

## 2022-08-15 DIAGNOSIS — Z760 Encounter for issue of repeat prescription: Secondary | ICD-10-CM

## 2022-08-15 DIAGNOSIS — L659 Nonscarring hair loss, unspecified: Secondary | ICD-10-CM | POA: Diagnosis not present

## 2022-08-15 DIAGNOSIS — E039 Hypothyroidism, unspecified: Secondary | ICD-10-CM

## 2022-08-15 DIAGNOSIS — E559 Vitamin D deficiency, unspecified: Secondary | ICD-10-CM | POA: Diagnosis not present

## 2022-08-15 DIAGNOSIS — R928 Other abnormal and inconclusive findings on diagnostic imaging of breast: Secondary | ICD-10-CM | POA: Diagnosis not present

## 2022-08-15 DIAGNOSIS — E7889 Other lipoprotein metabolism disorders: Secondary | ICD-10-CM | POA: Diagnosis not present

## 2022-08-15 DIAGNOSIS — Z862 Personal history of diseases of the blood and blood-forming organs and certain disorders involving the immune mechanism: Secondary | ICD-10-CM | POA: Diagnosis not present

## 2022-08-15 MED ORDER — AMPHETAMINE-DEXTROAMPHET ER 20 MG PO CP24: 20.0000 mg | ORAL_CAPSULE | ORAL | 0 refills | Status: DC

## 2022-08-15 MED ORDER — AMPHETAMINE-DEXTROAMPHETAMINE 10 MG PO TABS: 10.0000 mg | ORAL_TABLET | Freq: Every day | ORAL | 0 refills | Status: DC | PRN

## 2022-08-15 MED ORDER — ALPRAZOLAM 0.5 MG PO TABS: 0.5000 mg | ORAL_TABLET | Freq: Every evening | ORAL | 2 refills | Status: DC | PRN

## 2022-08-15 MED ORDER — FLUOCINONIDE 0.05 % EX CREA: TOPICAL_CREAM | CUTANEOUS | 5 refills | Status: DC

## 2022-08-15 NOTE — Progress Notes (Signed)
[facility] [place], [place] [address]  Internal MEDICINE  Office Visit Note  Patient Name: Yesenia Ward  [id number]  [id number]  Date of Service: 08/15/2022  Chief Complaint  Patient presents with   Follow-up    ADHD med check     HPI Bobbie presents for follow-up visit for ADHD, hypothyroidism and medication refills.  ADHD -- current adderall dose is effective. Denies palpitations or other side effects of the medication. Heart rate and BP are normal. No issues, needs refills. UDS done last visit.  Hypothyroidism -- stable, take levothyroxine GIST tumor -- has appt with specialist at [facility] in January.      Current Medication: Outpatient Encounter Medications as of 08/15/2022  Medication Sig   acetaminophen (TYLENOL) 500 MG tablet Take 500 mg by mouth every 6 (six) hours as needed.   azelastine (ASTELIN) 0.1 % nasal spray Place 2 sprays into both nostrils 2 (two) times daily. Use in each nostril as directed   b complex vitamins capsule Take 1 capsule by mouth daily.   doxycycline (VIBRA-TABS) 100 MG tablet Take 1 tablet (100 mg total) by mouth 2 (two) times daily. Take with food.   esomeprazole (NEXIUM) 40 MG capsule TAKE 1 CAPSULE TWICE A DAY BEFORE MEALS   Estradiol-Progesterone 1-100 MG CAPS Take by mouth.   Fexofenadine-Pseudoephedrine (ALLEGRA-D 12 HOUR PO) Take by mouth.   FLUoxetine (PROZAC) 20 MG capsule TAKE 1 CAPSULE DAILY   FLUoxetine (PROZAC) 20 MG tablet Take 1 tablet (20 mg total) by mouth daily.   imatinib (GLEEVEC) 100 MG tablet Take 100 mg by mouth 2 (two) times daily. Take with meals and large glass of water.Caution:Chemotherapy   levothyroxine (SYNTHROID) 125 MCG tablet TAKE 1 TABLET DAILY BEFORE BREAKFAST   methylPREDNISolone (MEDROL DOSEPAK) 4 MG TBPK tablet Take by mouth.   ondansetron (ZOFRAN) 4 MG tablet TAKE 1 TABLET(4 MG) BY MOUTH EVERY 8 HOURS AS NEEDED FOR NAUSEA OR VOMITING   Oxymetazoline HCl (UPNEEQ) 0.1 % SOLN Apply to  eye.   predniSONE (DELTASONE) 10 MG tablet Use per dose pack   [DISCONTINUED] ALPRAZolam (XANAX) 0.5 MG tablet Take 1-2 tablets (0.5-1 mg total) by mouth at bedtime as needed for anxiety. Take 1-2 tablets as needed at bedtime.   [DISCONTINUED] amphetamine-dextroamphetamine (ADDERALL XR) 20 MG 24 hr capsule Take 1 capsule (20 mg total) by mouth every morning.   [DISCONTINUED] amphetamine-dextroamphetamine (ADDERALL XR) 20 MG 24 hr capsule Take 1 capsule (20 mg total) by mouth every morning.   [DISCONTINUED] amphetamine-dextroamphetamine (ADDERALL XR) 20 MG 24 hr capsule Take 1 capsule (20 mg total) by mouth every morning.   [DISCONTINUED] amphetamine-dextroamphetamine (ADDERALL) 10 MG tablet Take 1 tablet (10 mg total) by mouth daily as needed (ADHD symptoms).   [DISCONTINUED] fluocinonide cream (LIDEX) 0.05 % SMARTSIG:1 Topical Daily   ALPRAZolam (XANAX) 0.5 MG tablet Take 1-2 tablets (0.5-1 mg total) by mouth at bedtime as needed for anxiety. Take 1-2 tablets as needed at bedtime.   amphetamine-dextroamphetamine (ADDERALL XR) 20 MG 24 hr capsule Take 1 capsule (20 mg total) by mouth every morning.   [START ON 09/12/2022] amphetamine-dextroamphetamine (ADDERALL XR) 20 MG 24 hr capsule Take 1 capsule (20 mg total) by mouth every morning.   [START ON 10/10/2022] amphetamine-dextroamphetamine (ADDERALL XR) 20 MG 24 hr capsule Take 1 capsule (20 mg total) by mouth every morning.   amphetamine-dextroamphetamine (ADDERALL) 10 MG tablet Take 1 tablet (10 mg total) by mouth daily as needed (ADHD symptoms).   fluocinonide cream (  LIDEX) 0.05 % SMARTSIG:1 Topical Daily   [DISCONTINUED] amphetamine-dextroamphetamine (ADDERALL XR) 20 MG 24 hr capsule Take 1 capsule (20 mg total) by mouth every morning.   [DISCONTINUED] amphetamine-dextroamphetamine (ADDERALL XR) 20 MG 24 hr capsule Take 1 capsule (20 mg total) by mouth every morning.   [DISCONTINUED] amphetamine-dextroamphetamine (ADDERALL XR) 20 MG 24 hr capsule  Take 1 capsule (20 mg total) by mouth every morning.   [DISCONTINUED] amphetamine-dextroamphetamine (ADDERALL) 10 MG tablet Take 1 tablet (10 mg total) by mouth daily as needed (ADHD symptoms).   No facility-administered encounter medications on file as of 08/15/2022.    Surgical History: Past Surgical History:  Procedure Laterality Date   AUGMENTATION MAMMAPLASTY Bilateral 2005   gist tumor remooval     OVARIAN CYST REMOVAL Left    TUBAL LIGATION      Medical History: Past Medical History:  Diagnosis Date   GIST (gastrointestinal stromal tumor), non-malignant    Hypothyroid    Tumor     Family History: Family History  Problem Relation Age of Onset   Diabetes Mother    Coronary artery disease Mother    Cirrhosis Father    Cancer Maternal Uncle    Rectal cancer Paternal Aunt    Cancer Paternal Aunt     Social History   Socioeconomic History   Marital status: Married    Spouse name: Not on file   Number of children: Not on file   Years of education: Not on file   Highest education level: Not on file  Occupational History   Not on file  Tobacco Use   Smoking status: Former   Smokeless tobacco: Never  Vaping Use   Vaping Use: Never used  Substance and Sexual Activity   Alcohol use: Yes    Comment: occasionally   Drug use: Never   Sexual activity: Not on file  Other Topics Concern   Not on file  Social History Narrative   Not on file   Social Determinants of Health   Financial Resource Strain: Not on file  Food Insecurity: Not on file  Transportation Needs: Not on file  Physical Activity: Not on file  Stress: Not on file  Social Connections: Not on file  Intimate Partner Violence: Not on file      Review of Systems  Constitutional:  Negative for chills, fatigue and unexpected weight change.  HENT:  Negative for congestion, rhinorrhea, sneezing and sore throat.   Eyes:  Negative for redness.  Respiratory:  Negative for cough, chest tightness and  shortness of breath.   Cardiovascular:  Negative for chest pain and palpitations.  Gastrointestinal:  Negative for abdominal pain, constipation, diarrhea, nausea and vomiting.  Genitourinary:  Negative for dysuria and frequency.  Musculoskeletal:  Negative for arthralgias, back pain, joint swelling and neck pain.  Skin:  Negative for rash.  Neurological: Negative.  Negative for tremors and numbness.  Hematological:  Negative for adenopathy. Does not bruise/bleed easily.  Psychiatric/Behavioral:  Negative for behavioral problems (Depression), sleep disturbance and suicidal ideas. The patient is not nervous/anxious.     Vital Signs: BP 120/70   Pulse 100   Temp 98.3 F (36.8 C)   Resp 16   Ht '5\' 3"'$  (1.6 m)   Wt 129 lb 9.6 oz (58.8 kg)   SpO2 99%   BMI 22.96 kg/m    Physical Exam Vitals reviewed.  Constitutional:      General: She is not in acute distress.    Appearance: Normal appearance. She is  normal weight. She is not ill-appearing.  HENT:     Head: Normocephalic and atraumatic.  Eyes:     Pupils: Pupils are equal, round, and reactive to light.  Cardiovascular:     Rate and Rhythm: Normal rate and regular rhythm.  Pulmonary:     Effort: Pulmonary effort is normal. No respiratory distress.  Neurological:     Mental Status: She is alert and oriented to person, place, and time.     Cranial Nerves: No cranial nerve deficit.     Coordination: Coordination normal.     Gait: Gait normal.  Psychiatric:        Mood and Affect: Mood normal.        Behavior: Behavior normal.        Assessment/Plan: 1. Acquired hypothyroidism Continue levothyroxine as prescribed  2. Medication refill - ALPRAZolam (XANAX) 0.5 MG tablet; Take 1-2 tablets (0.5-1 mg total) by mouth at bedtime as needed for anxiety. Take 1-2 tablets as needed at bedtime.  Dispense: 60 tablet; Refill: 2 - amphetamine-dextroamphetamine (ADDERALL XR) 20 MG 24 hr capsule; Take 1 capsule (20 mg total) by mouth  every morning.  Dispense: 30 capsule; Refill: 0 - amphetamine-dextroamphetamine (ADDERALL XR) 20 MG 24 hr capsule; Take 1 capsule (20 mg total) by mouth every morning.  Dispense: 30 capsule; Refill: 0 - amphetamine-dextroamphetamine (ADDERALL XR) 20 MG 24 hr capsule; Take 1 capsule (20 mg total) by mouth every morning.  Dispense: 30 capsule; Refill: 0 - amphetamine-dextroamphetamine (ADDERALL) 10 MG tablet; Take 1 tablet (10 mg total) by mouth daily as needed (ADHD symptoms).  Dispense: 90 tablet; Refill: 0 - fluocinonide cream (LIDEX) 0.05 %; SMARTSIG:1 Topical Daily  Dispense: 30 g; Refill: 5  3. Adult attention deficit disorder Refills x3 months ordered, follow up in 3 months for additional refills, UDS due at next visit.    General Counseling: tu shimmel understanding of the findings of todays visit and agrees with plan of treatment. I have discussed any further diagnostic evaluation that may be needed or ordered today. We also reviewed her medications today. she has been encouraged to call the office with any questions or concerns that should arise related to todays visit.    No orders of the defined types were placed in this encounter.   Meds ordered this encounter  Medications   DISCONTD: amphetamine-dextroamphetamine (ADDERALL XR) 20 MG 24 hr capsule    Sig: Take 1 capsule (20 mg total) by mouth every morning.    Dispense:  30 capsule    Refill:  0    Please fill for december   DISCONTD: amphetamine-dextroamphetamine (ADDERALL XR) 20 MG 24 hr capsule    Sig: Take 1 capsule (20 mg total) by mouth every morning.    Dispense:  30 capsule    Refill:  0    Please fill for january   DISCONTD: amphetamine-dextroamphetamine (ADDERALL) 10 MG tablet    Sig: Take 1 tablet (10 mg total) by mouth daily as needed (ADHD symptoms).    Dispense:  90 tablet    Refill:  0    3 month supply of extra additional dose to be taken after taking her 20 mg XR adderall dose   DISCONTD:  amphetamine-dextroamphetamine (ADDERALL XR) 20 MG 24 hr capsule    Sig: Take 1 capsule (20 mg total) by mouth every morning.    Dispense:  30 capsule    Refill:  0    Please fill for february   ALPRAZolam (XANAX) 0.5 MG tablet  Sig: Take 1-2 tablets (0.5-1 mg total) by mouth at bedtime as needed for anxiety. Take 1-2 tablets as needed at bedtime.    Dispense:  60 tablet    Refill:  2   amphetamine-dextroamphetamine (ADDERALL XR) 20 MG 24 hr capsule    Sig: Take 1 capsule (20 mg total) by mouth every morning.    Dispense:  30 capsule    Refill:  0    Please fill for december   amphetamine-dextroamphetamine (ADDERALL XR) 20 MG 24 hr capsule    Sig: Take 1 capsule (20 mg total) by mouth every morning.    Dispense:  30 capsule    Refill:  0    Please fill for january   amphetamine-dextroamphetamine (ADDERALL XR) 20 MG 24 hr capsule    Sig: Take 1 capsule (20 mg total) by mouth every morning.    Dispense:  30 capsule    Refill:  0    Please fill for february   amphetamine-dextroamphetamine (ADDERALL) 10 MG tablet    Sig: Take 1 tablet (10 mg total) by mouth daily as needed (ADHD symptoms).    Dispense:  90 tablet    Refill:  0    3 month supply of extra additional dose to be taken after taking her 20 mg XR adderall dose   fluocinonide cream (LIDEX) 0.05 %    Sig: SMARTSIG:1 Topical Daily    Dispense:  30 g    Refill:  5    Return in about 3 months (around 11/09/2022) for F/U, ADHD med check, Iosefa Weintraub PCP.   Total time spent:30 Minutes Time spent includes review of chart, medications, test results, and follow up plan with the patient.   [place] Controlled Substance Database was reviewed by me.  This patient was seen by Jonetta Osgood, FNP-C in collaboration with Dr. Clayborn Bigness as a part of collaborative care agreement.   Arryana Tolleson R. Valetta Fuller, MSN, FNP-C Internal medicine

## 2022-08-16 LAB — CMP14+EGFR
ALT: 29 IU/L (ref 0–32)
AST: 23 IU/L (ref 0–40)
Albumin/Globulin Ratio: 2 (ref 1.2–2.2)
Albumin: 4.7 g/dL (ref 3.8–4.9)
Alkaline Phosphatase: 70 IU/L (ref 44–121)
BUN/Creatinine Ratio: 22 (ref 9–23)
BUN: 15 mg/dL (ref 6–24)
Bilirubin Total: 0.6 mg/dL (ref 0.0–1.2)
CO2: 23 mmol/L (ref 20–29)
Calcium: 9.7 mg/dL (ref 8.7–10.2)
Chloride: 100 mmol/L (ref 96–106)
Creatinine, Ser: 0.68 mg/dL (ref 0.57–1.00)
Globulin, Total: 2.3 g/dL (ref 1.5–4.5)
Glucose: 110 mg/dL — ABNORMAL HIGH (ref 70–99)
Potassium: 4.1 mmol/L (ref 3.5–5.2)
Sodium: 140 mmol/L (ref 134–144)
Total Protein: 7 g/dL (ref 6.0–8.5)
eGFR: 102 mL/min/{1.73_m2} (ref >59)

## 2022-08-16 LAB — CBC WITH DIFFERENTIAL/PLATELET
Basophils Absolute: 0.1 10*3/uL (ref 0.0–0.2)
Basos: 1 %
EOS (ABSOLUTE): 0.1 10*3/uL (ref 0.0–0.4)
Eos: 2 %
Hematocrit: 42.7 % (ref 34.0–46.6)
Hemoglobin: 14.6 g/dL (ref 11.1–15.9)
Immature Grans (Abs): 0 10*3/uL (ref 0.0–0.1)
Immature Granulocytes: 0 %
Lymphocytes Absolute: 1.5 10*3/uL (ref 0.7–3.1)
Lymphs: 37 %
MCH: 32 pg (ref 26.6–33.0)
MCHC: 34.2 g/dL (ref 31.5–35.7)
MCV: 94 fL (ref 79–97)
Monocytes Absolute: 0.4 10*3/uL (ref 0.1–0.9)
Monocytes: 9 %
Neutrophils Absolute: 1.9 10*3/uL (ref 1.4–7.0)
Neutrophils: 51 %
Platelets: 338 10*3/uL (ref 150–450)
RBC: 4.56 x10E6/uL (ref 3.77–5.28)
RDW: 12.8 % (ref 11.7–15.4)
WBC: 3.9 10*3/uL (ref 3.4–10.8)

## 2022-08-16 LAB — C-REACTIVE PROTEIN: CRP: 2 mg/L (ref 0–10)

## 2022-08-16 LAB — LIPID PANEL
Chol/HDL Ratio: 4.8 ratio — ABNORMAL HIGH (ref 0.0–4.4)
Cholesterol, Total: 304 mg/dL — ABNORMAL HIGH (ref 100–199)
HDL: 64 mg/dL (ref >39)
LDL Chol Calc (NIH): 147 mg/dL — ABNORMAL HIGH (ref 0–99)
Triglycerides: 495 mg/dL — ABNORMAL HIGH (ref 0–149)
VLDL Cholesterol Cal: 93 mg/dL — ABNORMAL HIGH (ref 5–40)

## 2022-08-16 LAB — VITAMIN D 25 HYDROXY (VIT D DEFICIENCY, FRACTURES): Vit D, 25-Hydroxy: 19.1 ng/mL — ABNORMAL LOW (ref 30.0–100.0)

## 2022-08-16 LAB — B12 AND FOLATE PANEL
Folate: 10.3 ng/mL (ref >3.0)
Vitamin B-12: 415 pg/mL (ref 232–1245)

## 2022-08-16 LAB — ANA W/REFLEX IF POSITIVE: Anti Nuclear Antibody (ANA): NEGATIVE

## 2022-08-16 LAB — TSH+FREE T4
Free T4: 1.21 ng/dL (ref 0.82–1.77)
TSH: 0.468 u[IU]/mL (ref 0.450–4.500)

## 2022-08-16 LAB — RHEUMATOID FACTOR: Rheumatoid fact SerPl-aCnc: 83.5 IU/mL — ABNORMAL HIGH (ref <14.0)

## 2022-08-16 LAB — SEDIMENTATION RATE: Sed Rate: 3 mm/hr (ref 0–40)

## 2022-08-17 DIAGNOSIS — C49A3 Gastrointestinal stromal tumor of small intestine: Secondary | ICD-10-CM | POA: Diagnosis not present

## 2022-08-30 DIAGNOSIS — C49A3 Gastrointestinal stromal tumor of small intestine: Secondary | ICD-10-CM | POA: Diagnosis not present

## 2022-09-02 ENCOUNTER — Telehealth: Payer: Self-pay | Admitting: Nurse Practitioner

## 2022-09-02 NOTE — Telephone Encounter (Signed)
Called patient and discussed lab results: Rheumatoid factor -- 83.5  H     has hx of elevated RF and has seen rheumatology, negative for other disorders and no joint pains.  Glucose 110  -- will recheck after her surgery Vitamin D 19.1 Total chol 304 Triglycerides 495 HDL 64 VLDL 93 LDL 147 Chol/HDL ratio 4.8  Recheck lipids and CBC after surgery WBC and abs neutrophil were low normal.

## 2022-09-05 DIAGNOSIS — K219 Gastro-esophageal reflux disease without esophagitis: Secondary | ICD-10-CM | POA: Diagnosis not present

## 2022-09-05 DIAGNOSIS — F419 Anxiety disorder, unspecified: Secondary | ICD-10-CM | POA: Diagnosis not present

## 2022-09-05 DIAGNOSIS — Z79899 Other long term (current) drug therapy: Secondary | ICD-10-CM | POA: Diagnosis not present

## 2022-09-05 DIAGNOSIS — Z91018 Allergy to other foods: Secondary | ICD-10-CM | POA: Diagnosis not present

## 2022-09-05 DIAGNOSIS — Z881 Allergy status to other antibiotic agents status: Secondary | ICD-10-CM | POA: Diagnosis not present

## 2022-09-05 DIAGNOSIS — C49A3 Gastrointestinal stromal tumor of small intestine: Secondary | ICD-10-CM | POA: Diagnosis not present

## 2022-09-05 DIAGNOSIS — Z888 Allergy status to other drugs, medicaments and biological substances status: Secondary | ICD-10-CM | POA: Diagnosis not present

## 2022-09-05 DIAGNOSIS — R1909 Other intra-abdominal and pelvic swelling, mass and lump: Secondary | ICD-10-CM | POA: Diagnosis not present

## 2022-09-05 DIAGNOSIS — Z87891 Personal history of nicotine dependence: Secondary | ICD-10-CM | POA: Diagnosis not present

## 2022-09-05 DIAGNOSIS — K660 Peritoneal adhesions (postprocedural) (postinfection): Secondary | ICD-10-CM | POA: Diagnosis not present

## 2022-09-19 ENCOUNTER — Encounter: Payer: Self-pay | Admitting: Nurse Practitioner

## 2022-09-20 MED ORDER — VALACYCLOVIR HCL 1 G PO TABS: 1000.0000 mg | ORAL_TABLET | Freq: Two times a day (BID) | ORAL | 3 refills | Status: AC

## 2022-09-21 DIAGNOSIS — C49A3 Gastrointestinal stromal tumor of small intestine: Secondary | ICD-10-CM | POA: Diagnosis not present

## 2022-09-30 ENCOUNTER — Other Ambulatory Visit: Payer: Self-pay

## 2022-10-05 DIAGNOSIS — C49A3 Gastrointestinal stromal tumor of small intestine: Secondary | ICD-10-CM | POA: Diagnosis not present

## 2022-10-11 ENCOUNTER — Ambulatory Visit
Admission: RE | Admit: 2022-10-11 | Discharge: 2022-10-11 | Disposition: A | Discharge status: Home / Self Care | Payer: BC Managed Care – PPO | Source: Ambulatory Visit | Attending: Nurse Practitioner | Admitting: Nurse Practitioner

## 2022-10-11 ENCOUNTER — Other Ambulatory Visit: Payer: Self-pay | Admitting: Nurse Practitioner

## 2022-10-11 DIAGNOSIS — E039 Hypothyroidism, unspecified: Secondary | ICD-10-CM

## 2022-10-11 DIAGNOSIS — Z79899 Other long term (current) drug therapy: Secondary | ICD-10-CM

## 2022-10-11 DIAGNOSIS — E559 Vitamin D deficiency, unspecified: Secondary | ICD-10-CM

## 2022-10-11 DIAGNOSIS — Z862 Personal history of diseases of the blood and blood-forming organs and certain disorders involving the immune mechanism: Secondary | ICD-10-CM

## 2022-10-11 DIAGNOSIS — Z0001 Encounter for general adult medical examination with abnormal findings: Secondary | ICD-10-CM

## 2022-10-11 DIAGNOSIS — R928 Other abnormal and inconclusive findings on diagnostic imaging of breast: Secondary | ICD-10-CM

## 2022-10-11 DIAGNOSIS — R300 Dysuria: Secondary | ICD-10-CM

## 2022-10-11 DIAGNOSIS — L659 Nonscarring hair loss, unspecified: Secondary | ICD-10-CM

## 2022-10-11 DIAGNOSIS — M151 Heberden's nodes (with arthropathy): Secondary | ICD-10-CM

## 2022-10-11 DIAGNOSIS — Z760 Encounter for issue of repeat prescription: Secondary | ICD-10-CM

## 2022-10-12 ENCOUNTER — Other Ambulatory Visit: Payer: Self-pay | Admitting: Nurse Practitioner

## 2022-10-12 DIAGNOSIS — N63 Unspecified lump in unspecified breast: Secondary | ICD-10-CM

## 2022-10-12 DIAGNOSIS — R928 Other abnormal and inconclusive findings on diagnostic imaging of breast: Secondary | ICD-10-CM

## 2022-10-17 DIAGNOSIS — C49A3 Gastrointestinal stromal tumor of small intestine: Secondary | ICD-10-CM | POA: Diagnosis not present

## 2022-10-18 ENCOUNTER — Ambulatory Visit
Admission: RE | Admit: 2022-10-18 | Discharge: 2022-10-18 | Disposition: A | Discharge status: Home / Self Care | Payer: BC Managed Care – PPO | Source: Ambulatory Visit | Attending: Nurse Practitioner | Admitting: Nurse Practitioner

## 2022-10-18 DIAGNOSIS — R928 Other abnormal and inconclusive findings on diagnostic imaging of breast: Secondary | ICD-10-CM

## 2022-10-18 DIAGNOSIS — D490 Neoplasm of unspecified behavior of digestive system: Secondary | ICD-10-CM | POA: Diagnosis not present

## 2022-10-18 DIAGNOSIS — N63 Unspecified lump in unspecified breast: Secondary | ICD-10-CM | POA: Insufficient documentation

## 2022-10-18 DIAGNOSIS — N6081 Other benign mammary dysplasias of right breast: Secondary | ICD-10-CM | POA: Diagnosis not present

## 2022-10-18 DIAGNOSIS — N6311 Unspecified lump in the right breast, upper outer quadrant: Secondary | ICD-10-CM | POA: Diagnosis not present

## 2022-10-18 HISTORY — PX: BREAST BIOPSY: SHX20

## 2022-10-18 MED ORDER — LIDOCAINE HCL (PF) 1 % IJ SOLN
2.0000 mL | Freq: Once | INTRAMUSCULAR | Status: AC
  Administered 2022-10-18: 2 mL

## 2022-10-18 MED ORDER — LIDOCAINE-EPINEPHRINE 1 %-1:100000 IJ SOLN
8.0000 mL | Freq: Once | INTRAMUSCULAR | Status: AC
  Administered 2022-10-18: 8 mL

## 2022-10-19 LAB — SURGICAL PATHOLOGY

## 2022-10-20 ENCOUNTER — Encounter: Payer: Self-pay | Admitting: *Deleted

## 2022-10-20 NOTE — Progress Notes (Signed)
Referral recieved from [facility] for benign breast mass.  She will decide which surgeons she would like to see and call me back when she has made a decision.

## 2022-10-27 ENCOUNTER — Encounter: Payer: Self-pay | Admitting: *Deleted

## 2022-10-27 NOTE — Progress Notes (Signed)
Called to see if patient has decided on which surgeon she would like to see.   Left VM asking for return call.

## 2022-11-03 ENCOUNTER — Encounter: Payer: Self-pay | Admitting: *Deleted

## 2022-11-03 NOTE — Progress Notes (Signed)
Called and left VM to see if Yesenia Ward would like surgeon referral/which surgeon she would like to see.  Asked for a return call.

## 2022-11-10 ENCOUNTER — Encounter: Payer: Self-pay | Admitting: *Deleted

## 2022-11-10 NOTE — Progress Notes (Signed)
Have attempted to call patient to see if she would like surgical consult.  She has not returned call.  Letter sent stating she can call me if she would like to set up surgical consultation or she can go through her primary care physician.

## 2022-11-14 ENCOUNTER — Encounter: Payer: Self-pay | Admitting: Nurse Practitioner

## 2022-11-14 ENCOUNTER — Other Ambulatory Visit: Payer: Self-pay | Admitting: Nurse Practitioner

## 2022-11-14 ENCOUNTER — Ambulatory Visit (INDEPENDENT_AMBULATORY_CARE_PROVIDER_SITE_OTHER): Payer: BC Managed Care – PPO | Admitting: Nurse Practitioner

## 2022-11-14 VITALS — BP 142/75 | HR 98 | Temp 98.2°F | Resp 16 | Ht 63.0 in | Wt 129.4 lb

## 2022-11-14 DIAGNOSIS — E039 Hypothyroidism, unspecified: Secondary | ICD-10-CM | POA: Diagnosis not present

## 2022-11-14 DIAGNOSIS — Z79899 Other long term (current) drug therapy: Secondary | ICD-10-CM | POA: Diagnosis not present

## 2022-11-14 DIAGNOSIS — J3089 Other allergic rhinitis: Secondary | ICD-10-CM

## 2022-11-14 DIAGNOSIS — R1114 Bilious vomiting: Secondary | ICD-10-CM

## 2022-11-14 DIAGNOSIS — E782 Mixed hyperlipidemia: Secondary | ICD-10-CM | POA: Diagnosis not present

## 2022-11-14 DIAGNOSIS — Z760 Encounter for issue of repeat prescription: Secondary | ICD-10-CM

## 2022-11-14 DIAGNOSIS — F988 Other specified behavioral and emotional disorders with onset usually occurring in childhood and adolescence: Secondary | ICD-10-CM

## 2022-11-14 LAB — POCT URINE DRUG SCREEN
Methylenedioxyamphetamine: NOT DETECTED
POC Amphetamine UR: POSITIVE — AB
POC BENZODIAZEPINES UR: POSITIVE — AB
POC Barbiturate UR: NOT DETECTED
POC Cocaine UR: NOT DETECTED
POC Ecstasy UR: NOT DETECTED
POC Marijuana UR: NOT DETECTED
POC Methadone UR: NOT DETECTED
POC Methamphetamine UR: NOT DETECTED
POC Opiate Ur: NOT DETECTED
POC Oxycodone UR: NOT DETECTED
POC PHENCYCLIDINE UR: NOT DETECTED
POC TRICYCLICS UR: NOT DETECTED

## 2022-11-14 MED ORDER — AMPHETAMINE-DEXTROAMPHET ER 20 MG PO CP24: 20.0000 mg | ORAL_CAPSULE | ORAL | 0 refills | Status: DC

## 2022-11-14 MED ORDER — ALPRAZOLAM 0.5 MG PO TABS: 0.5000 mg | ORAL_TABLET | Freq: Every evening | ORAL | 2 refills | Status: DC | PRN

## 2022-11-14 MED ORDER — AMPHETAMINE-DEXTROAMPHETAMINE 10 MG PO TABS: 10.0000 mg | ORAL_TABLET | Freq: Every day | ORAL | 0 refills | Status: DC | PRN

## 2022-11-14 MED ORDER — FEXOFENADINE-PSEUDOEPHED ER 60-120 MG PO TB12: 1.0000 | ORAL_TABLET | Freq: Two times a day (BID) | ORAL | 2 refills | Status: DC

## 2022-11-14 MED ORDER — ONDANSETRON HCL 4 MG PO TABS: ORAL_TABLET | ORAL | 3 refills | Status: DC

## 2022-11-14 NOTE — Progress Notes (Signed)
[facility] [place],  [address]  Internal MEDICINE  Office Visit Note  Patient Name: Yesenia Ward  [id number]  [id number]  Date of Service: 11/14/2022  Chief Complaint  Patient presents with   Follow-up    HPI Eithel presents for a follow-up visit for med refills, ADHD ADHD -- current dose is effective, heart rate and BP are stable. Denies any palpitations or other adverse side effects.  Had surgery to remove GIST tumor but they could not find the tumor, oncologist looked over all of her imaging and confirmed that it is there even though they did not find it Had breast biopsy, it was benign,. Allergic rhinitis -- takes allegra D     Current Medication: Outpatient Encounter Medications as of 11/14/2022  Medication Sig   acetaminophen (TYLENOL) 500 MG tablet Take 500 mg by mouth every 6 (six) hours as needed.   azelastine (ASTELIN) 0.1 % nasal spray Place 2 sprays into both nostrils 2 (two) times daily. Use in each nostril as directed   b complex vitamins capsule Take 1 capsule by mouth daily.   doxycycline (VIBRA-TABS) 100 MG tablet Take 1 tablet (100 mg total) by mouth 2 (two) times daily. Take with food.   esomeprazole (NEXIUM) 40 MG capsule TAKE 1 CAPSULE TWICE A DAY BEFORE MEALS   Estradiol-Progesterone 1-100 MG CAPS Take by mouth.   fexofenadine-pseudoephedrine (ALLEGRA-D) 60-120 MG 12 hr tablet Take 1 tablet by mouth 2 (two) times daily.   fluocinonide cream (LIDEX) 0.05 % SMARTSIG:1 Topical Daily   FLUoxetine (PROZAC) 20 MG capsule TAKE 1 CAPSULE DAILY   FLUoxetine (PROZAC) 20 MG tablet Take 1 tablet (20 mg total) by mouth daily.   imatinib (GLEEVEC) 100 MG tablet Take 100 mg by mouth 2 (two) times daily. Take with meals and large glass of water.Caution:Chemotherapy   levothyroxine (SYNTHROID) 125 MCG tablet TAKE 1 TABLET DAILY BEFORE BREAKFAST   methylPREDNISolone (MEDROL DOSEPAK) 4 MG TBPK tablet Take by mouth.   Oxymetazoline HCl  (UPNEEQ) 0.1 % SOLN Apply to eye.   predniSONE (DELTASONE) 10 MG tablet Use per dose pack   valACYclovir (VALTREX) 1000 MG tablet Take 1 tablet (1,000 mg total) by mouth 2 (two) times daily. During outbreak/flare until resolved   [DISCONTINUED] ALPRAZolam (XANAX) 0.5 MG tablet Take 1-2 tablets (0.5-1 mg total) by mouth at bedtime as needed for anxiety. Take 1-2 tablets as needed at bedtime.   [DISCONTINUED] amphetamine-dextroamphetamine (ADDERALL XR) 20 MG 24 hr capsule Take 1 capsule (20 mg total) by mouth every morning.   [DISCONTINUED] amphetamine-dextroamphetamine (ADDERALL XR) 20 MG 24 hr capsule Take 1 capsule (20 mg total) by mouth every morning.   [DISCONTINUED] amphetamine-dextroamphetamine (ADDERALL XR) 20 MG 24 hr capsule Take 1 capsule (20 mg total) by mouth every morning.   [DISCONTINUED] amphetamine-dextroamphetamine (ADDERALL) 10 MG tablet Take 1 tablet (10 mg total) by mouth daily as needed (ADHD symptoms).   [DISCONTINUED] Fexofenadine-Pseudoephedrine (ALLEGRA-D 12 HOUR PO) Take by mouth.   [DISCONTINUED] ondansetron (ZOFRAN) 4 MG tablet TAKE 1 TABLET(4 MG) BY MOUTH EVERY 8 HOURS AS NEEDED FOR NAUSEA OR VOMITING   ALPRAZolam (XANAX) 0.5 MG tablet Take 1-2 tablets (0.5-1 mg total) by mouth at bedtime as needed for anxiety. Take 1-2 tablets as needed at bedtime.   [START ON 02/06/2023] amphetamine-dextroamphetamine (ADDERALL XR) 20 MG 24 hr capsule Take 1 capsule (20 mg total) by mouth every morning.   [START ON 12/12/2022] amphetamine-dextroamphetamine (ADDERALL XR) 20 MG 24 hr capsule Take 1 capsule (  20 mg total) by mouth every morning.   [START ON 01/09/2023] amphetamine-dextroamphetamine (ADDERALL XR) 20 MG 24 hr capsule Take 1 capsule (20 mg total) by mouth every morning.   amphetamine-dextroamphetamine (ADDERALL) 10 MG tablet Take 1 tablet (10 mg total) by mouth daily as needed (ADHD symptoms).   ondansetron (ZOFRAN) 4 MG tablet TAKE 1 TABLET(4 MG) BY MOUTH EVERY 8 HOURS AS NEEDED  FOR NAUSEA OR VOMITING   No facility-administered encounter medications on file as of 11/14/2022.    Surgical History: Past Surgical History:  Procedure Laterality Date   AUGMENTATION MAMMAPLASTY Bilateral 2005   BREAST BIOPSY Right 10/18/2022   [place] Core Bx, Coil Clip - path pending   BREAST BIOPSY Right 10/18/2022   [place] RT BREAST BX W LOC DEV 1ST LESION IMG BX SPEC US GUIDE 10/18/2022 [facility]   gist tumor remooval     OVARIAN CYST REMOVAL Left    TUBAL LIGATION      Medical History: Past Medical History:  Diagnosis Date   GIST (gastrointestinal stromal tumor), non-malignant    Hypothyroid    Tumor     Family History: Family History  Problem Relation Age of Onset   Diabetes Mother    Coronary artery disease Mother    Cirrhosis Father    Cancer Maternal Uncle    Rectal cancer Paternal Aunt    Cancer Paternal Aunt     Social History   Socioeconomic History   Marital status: Married    Spouse name: Not on file   Number of children: Not on file   Years of education: Not on file   Highest education level: Not on file  Occupational History   Not on file  Tobacco Use   Smoking status: Former   Smokeless tobacco: Never  Vaping Use   Vaping Use: Never used  Substance and Sexual Activity   Alcohol use: Yes    Comment: occasionally   Drug use: Never   Sexual activity: Not on file  Other Topics Concern   Not on file  Social History Narrative   Not on file   Social Determinants of Health   Financial Resource Strain: Not on file  Food Insecurity: Not on file  Transportation Needs: Not on file  Physical Activity: Not on file  Stress: Not on file  Social Connections: Not on file  Intimate Partner Violence: Not on file      Review of Systems  Constitutional:  Negative for chills, fatigue and unexpected weight change.  HENT:  Negative for congestion, rhinorrhea, sneezing and sore throat.   Eyes:  Negative for redness.  Respiratory:  Negative for cough,  chest tightness and shortness of breath.   Cardiovascular:  Negative for chest pain and palpitations.  Gastrointestinal:  Negative for abdominal pain, constipation, diarrhea, nausea and vomiting.  Genitourinary:  Negative for dysuria and frequency.  Musculoskeletal:  Negative for arthralgias, back pain, joint swelling and neck pain.  Skin:  Negative for rash.  Neurological: Negative.  Negative for tremors and numbness.  Hematological:  Negative for adenopathy. Does not bruise/bleed easily.  Psychiatric/Behavioral:  Negative for behavioral problems (Depression), sleep disturbance and suicidal ideas. The patient is not nervous/anxious.     Vital Signs: BP (!) 142/75   Pulse (!) 103   Temp 98.2 F (36.8 C)   Resp 16   Ht 5\' 3"  (1.6 m)   Wt 129 lb 6.4 oz (58.7 kg)   SpO2 98%   BMI 22.92 kg/m    Physical Exam  Vitals reviewed.  Constitutional:      General: She is not in acute distress.    Appearance: Normal appearance. She is normal weight. She is not ill-appearing.  HENT:     Head: Normocephalic and atraumatic.  Eyes:     Pupils: Pupils are equal, round, and reactive to light.  Cardiovascular:     Rate and Rhythm: Normal rate and regular rhythm.  Pulmonary:     Effort: Pulmonary effort is normal. No respiratory distress.  Neurological:     Mental Status: She is alert and oriented to person, place, and time.     Cranial Nerves: No cranial nerve deficit.     Coordination: Coordination normal.     Gait: Gait normal.  Psychiatric:        Mood and Affect: Mood normal.        Behavior: Behavior normal.        Assessment/Plan: 1. Acquired hypothyroidism Repeat thyroid labs  - TSH + free T4  2. Mixed hyperlipidemia Repet cholesterol panel - Lipid Profile  3. Non-seasonal allergic rhinitis due to other allergic trigger Take allegra D for allergies  - fexofenadine-pseudoephedrine (ALLEGRA-D) 60-120 MG 12 hr tablet; Take 1 tablet by mouth 2 (two) times daily.  Dispense:  60 tablet; Refill: 2  4. Bilious vomiting with nausea Zofran refills ordered - ondansetron (ZOFRAN) 4 MG tablet; TAKE 1 TABLET(4 MG) BY MOUTH EVERY 8 HOURS AS NEEDED FOR NAUSEA OR VOMITING  Dispense: 20 tablet; Refill: 3  5. Adult attention deficit disorder Refills x3 months ordered. Follow up in 3 months for additional refills.  - amphetamine-dextroamphetamine (ADDERALL XR) 20 MG 24 hr capsule; Take 1 capsule (20 mg total) by mouth every morning.  Dispense: 30 capsule; Refill: 0 - amphetamine-dextroamphetamine (ADDERALL XR) 20 MG 24 hr capsule; Take 1 capsule (20 mg total) by mouth every morning.  Dispense: 30 capsule; Refill: 0 - amphetamine-dextroamphetamine (ADDERALL) 10 MG tablet; Take 1 tablet (10 mg total) by mouth daily as needed (ADHD symptoms).  Dispense: 90 tablet; Refill: 0 - amphetamine-dextroamphetamine (ADDERALL XR) 20 MG 24 hr capsule; Take 1 capsule (20 mg total) by mouth every morning.  Dispense: 30 capsule; Refill: 0 - ALPRAZolam (XANAX) 0.5 MG tablet; Take 1-2 tablets (0.5-1 mg total) by mouth at bedtime as needed for anxiety. Take 1-2 tablets as needed at bedtime.  Dispense: 60 tablet; Refill: 2  6. Encounter for long-term (current) use of medications UDS done, consistent with current prescriptions - POCT Urine Drug Screen   General Counseling: Terrilee verbalizes understanding of the findings of todays visit and agrees with plan of treatment. I have discussed any further diagnostic evaluation that may be needed or ordered today. We also reviewed her medications today. she has been encouraged to call the office with any questions or concerns that should arise related to todays visit.    Orders Placed This Encounter  Procedures   Lipid Profile   TSH + free T4   POCT Urine Drug Screen    Meds ordered this encounter  Medications   amphetamine-dextroamphetamine (ADDERALL XR) 20 MG 24 hr capsule    Sig: Take 1 capsule (20 mg total) by mouth every morning.    Dispense:   30 capsule    Refill:  0    Please fill for june   amphetamine-dextroamphetamine (ADDERALL XR) 20 MG 24 hr capsule    Sig: Take 1 capsule (20 mg total) by mouth every morning.    Dispense:  30 capsule    Refill:  0    Please fill for april   amphetamine-dextroamphetamine (ADDERALL) 10 MG tablet    Sig: Take 1 tablet (10 mg total) by mouth daily as needed (ADHD symptoms).    Dispense:  90 tablet    Refill:  0    3 month supply of extra additional dose to be taken after taking her 20 mg XR adderall dose   amphetamine-dextroamphetamine (ADDERALL XR) 20 MG 24 hr capsule    Sig: Take 1 capsule (20 mg total) by mouth every morning.    Dispense:  30 capsule    Refill:  0    Please fill for may   ALPRAZolam (XANAX) 0.5 MG tablet    Sig: Take 1-2 tablets (0.5-1 mg total) by mouth at bedtime as needed for anxiety. Take 1-2 tablets as needed at bedtime.    Dispense:  60 tablet    Refill:  2   ondansetron (ZOFRAN) 4 MG tablet    Sig: TAKE 1 TABLET(4 MG) BY MOUTH EVERY 8 HOURS AS NEEDED FOR NAUSEA OR VOMITING    Dispense:  20 tablet    Refill:  3   fexofenadine-pseudoephedrine (ALLEGRA-D) 60-120 MG 12 hr tablet    Sig: Take 1 tablet by mouth 2 (two) times daily.    Dispense:  60 tablet    Refill:  2    Please fill as prescription    Return in about 3 months (around 02/07/2023) for F/U, anxiety med refill, ADHD med check, Consuella Scurlock PCP.   Total time spent:30 Minutes Time spent includes review of chart, medications, test results, and follow up plan with the patient.   [place] Controlled Substance Database was reviewed by me.  This patient was seen by Jonetta Osgood, FNP-C in collaboration with Dr. Clayborn Bigness as a part of collaborative care agreement.   Elonna Mcfarlane R. Valetta Fuller, MSN, FNP-C Internal medicine

## 2022-11-16 ENCOUNTER — Encounter: Payer: Self-pay | Admitting: Nurse Practitioner

## 2022-11-25 ENCOUNTER — Other Ambulatory Visit: Payer: Self-pay | Admitting: Nurse Practitioner

## 2022-11-25 DIAGNOSIS — K219 Gastro-esophageal reflux disease without esophagitis: Secondary | ICD-10-CM

## 2023-01-06 ENCOUNTER — Telehealth: Payer: Self-pay | Admitting: Nurse Practitioner

## 2023-01-06 ENCOUNTER — Telehealth: Payer: Self-pay

## 2023-01-06 MED ORDER — AZITHROMYCIN 250 MG PO TABS: ORAL_TABLET | ORAL | 0 refills | Status: AC

## 2023-01-06 NOTE — Telephone Encounter (Signed)
Patient called to report sinus infection symptoms, Yesenia Ward sent  Z-Pack.

## 2023-01-10 ENCOUNTER — Telehealth: Payer: Self-pay

## 2023-01-10 NOTE — Telephone Encounter (Signed)
error 

## 2023-01-11 NOTE — Telephone Encounter (Signed)
Patient would like a work note for being out of work.

## 2023-01-12 ENCOUNTER — Telehealth: Payer: Self-pay | Admitting: Nurse Practitioner

## 2023-01-12 ENCOUNTER — Telehealth: Payer: BC Managed Care – PPO | Admitting: Nurse Practitioner

## 2023-01-12 ENCOUNTER — Encounter: Payer: Self-pay | Admitting: Nurse Practitioner

## 2023-01-12 VITALS — Temp 97.2°F | Ht 63.0 in | Wt 129.0 lb

## 2023-01-12 DIAGNOSIS — J0110 Acute frontal sinusitis, unspecified: Secondary | ICD-10-CM

## 2023-01-12 NOTE — Progress Notes (Signed)
[facility] [address] [place], [place] [address]  Internal MEDICINE  Telephone Visit  Patient Name: Yesenia Ward  [id number]  [id number]  Date of Service: 01/12/2023  I connected with the patient at 1140 by telephone and verified the patients identity using two identifiers.   I discussed the limitations, risks, security and privacy concerns of performing an evaluation and management service by telephone and the availability of in person appointments. I also discussed with the patient that there may be a patient responsible charge related to the service.  The patient expressed understanding and agrees to proceed.    Chief Complaint  Patient presents with   Telephone Assessment    Work note    Telephone Screen   Sinusitis    Sinusitis Associated symptoms include congestion, coughing, headaches and sinus pressure. Pertinent negatives include no shortness of breath.   Yesenia Ward presents for a telehealth virtual visit for  Zpak helped some but she stayed out of work on 01/09/23 and 01/10/23. She started feeling better yesterday and today so she just needs a work note. Her symptoms were nasal congestion, headache, cough, sinus drainage and pressure.   Current Medication: Outpatient Encounter Medications as of 01/12/2023  Medication Sig   acetaminophen (TYLENOL) 500 MG tablet Take 500 mg by mouth every 6 (six) hours as needed.   ALPRAZolam (XANAX) 0.5 MG tablet Take 1-2 tablets (0.5-1 mg total) by mouth at bedtime as needed for anxiety. Take 1-2 tablets as needed at bedtime.   [START ON 02/06/2023] amphetamine-dextroamphetamine (ADDERALL XR) 20 MG 24 hr capsule Take 1 capsule (20 mg total) by mouth every morning.   amphetamine-dextroamphetamine (ADDERALL XR) 20 MG 24 hr capsule Take 1 capsule (20 mg total) by mouth every morning.   amphetamine-dextroamphetamine (ADDERALL XR) 20 MG 24 hr capsule Take 1 capsule (20 mg total) by mouth every morning.   amphetamine-dextroamphetamine  (ADDERALL) 10 MG tablet Take 1 tablet (10 mg total) by mouth daily as needed (ADHD symptoms).   azelastine (ASTELIN) 0.1 % nasal spray Place 2 sprays into both nostrils 2 (two) times daily. Use in each nostril as directed   b complex vitamins capsule Take 1 capsule by mouth daily.   doxycycline (VIBRA-TABS) 100 MG tablet Take 1 tablet (100 mg total) by mouth 2 (two) times daily. Take with food.   esomeprazole (NEXIUM) 40 MG capsule TAKE 1 CAPSULE TWICE A DAY BEFORE MEALS   Estradiol-Progesterone 1-100 MG CAPS Take by mouth.   fexofenadine-pseudoephedrine (ALLEGRA-D) 60-120 MG 12 hr tablet Take 1 tablet by mouth 2 (two) times daily.   fluocinonide cream (LIDEX) 0.05 % SMARTSIG:1 Topical Daily   FLUoxetine (PROZAC) 20 MG capsule TAKE 1 CAPSULE DAILY   FLUoxetine (PROZAC) 20 MG tablet Take 1 tablet (20 mg total) by mouth daily.   imatinib (GLEEVEC) 100 MG tablet Take 100 mg by mouth 2 (two) times daily. Take with meals and large glass of water.Caution:Chemotherapy   levothyroxine (SYNTHROID) 125 MCG tablet TAKE 1 TABLET DAILY BEFORE BREAKFAST   methylPREDNISolone (MEDROL DOSEPAK) 4 MG TBPK tablet Take by mouth.   ondansetron (ZOFRAN) 4 MG tablet TAKE 1 TABLET(4 MG) BY MOUTH EVERY 8 HOURS AS NEEDED FOR NAUSEA OR VOMITING   Oxymetazoline HCl (UPNEEQ) 0.1 % SOLN Apply to eye.   predniSONE (DELTASONE) 10 MG tablet Use per dose pack   valACYclovir (VALTREX) 1000 MG tablet Take 1 tablet (1,000 mg total) by mouth 2 (two) times daily. During outbreak/flare until resolved   No facility-administered encounter medications on  file as of 01/12/2023.    Surgical History: Past Surgical History:  Procedure Laterality Date   AUGMENTATION MAMMAPLASTY Bilateral 2005   BREAST BIOPSY Right 10/18/2022   [place] Core Bx, Coil Clip - path pending   BREAST BIOPSY Right 10/18/2022   [place] RT BREAST BX W LOC DEV 1ST LESION IMG BX SPEC US GUIDE 10/18/2022 [facility]   gist tumor remooval     OVARIAN CYST REMOVAL Left     TUBAL LIGATION      Medical History: Past Medical History:  Diagnosis Date   GIST (gastrointestinal stromal tumor), non-malignant    Hypothyroid    Tumor     Family History: Family History  Problem Relation Age of Onset   Diabetes Mother    Coronary artery disease Mother    Cirrhosis Father    Cancer Maternal Uncle    Rectal cancer Paternal Aunt    Cancer Paternal Aunt     Social History   Socioeconomic History   Marital status: Married    Spouse name: Not on file   Number of children: Not on file   Years of education: Not on file   Highest education level: Not on file  Occupational History   Not on file  Tobacco Use   Smoking status: Former   Smokeless tobacco: Never  Vaping Use   Vaping Use: Never used  Substance and Sexual Activity   Alcohol use: Yes    Comment: occasionally   Drug use: Never   Sexual activity: Not on file  Other Topics Concern   Not on file  Social History Narrative   Not on file   Social Determinants of Health   Financial Resource Strain: Not on file  Food Insecurity: Not on file  Transportation Needs: Not on file  Physical Activity: Not on file  Stress: Not on file  Social Connections: Not on file  Intimate Partner Violence: Not on file      Review of Systems  HENT:  Positive for congestion, postnasal drip, rhinorrhea, sinus pressure and sinus pain.   Respiratory:  Positive for cough. Negative for chest tightness, shortness of breath and wheezing.   Neurological:  Positive for headaches.    Vital Signs: Temp (!) 97.2 F (36.2 C)   Ht 5\' 3"  (1.6 m)   Wt 129 lb (58.5 kg)   BMI 22.85 kg/m    Observation/Objective: She is alert and oriented and engages in conversation appropriately. No acute distress noted.     Assessment/Plan: 1. Acute non-recurrent frontal sinusitis Work note written and sent to patient.    General Counseling: Yesenia Ward understanding of the findings of today's phone visit and agrees with  plan of treatment. I have discussed any further diagnostic evaluation that may be needed or ordered today. We also reviewed her medications today. she has been encouraged to call the office with any questions or concerns that should arise related to todays visit.  Return if symptoms worsen or fail to improve.   No orders of the defined types were placed in this encounter.   No orders of the defined types were placed in this encounter.   Time spent:10 Minutes Time spent with patient included reviewing progress notes, labs, imaging studies, and discussing plan for follow up.  [place] Controlled Substance Database was reviewed by me for overdose risk score (ORS) if appropriate.  This patient was seen by Sallyanne Kuster, FNP-C in collaboration with Dr. Beverely Risen as a part of collaborative care agreement.  Cristina Ceniceros R.  Valetta Fuller, MSN, FNP-C Internal medicine

## 2023-01-12 NOTE — Telephone Encounter (Signed)
Worked Warden/ranger to [employer]

## 2023-01-25 ENCOUNTER — Other Ambulatory Visit: Payer: Self-pay | Admitting: Nurse Practitioner

## 2023-01-31 DIAGNOSIS — C49A3 Gastrointestinal stromal tumor of small intestine: Secondary | ICD-10-CM | POA: Diagnosis not present

## 2023-02-06 DIAGNOSIS — C49A3 Gastrointestinal stromal tumor of small intestine: Secondary | ICD-10-CM | POA: Diagnosis not present

## 2023-02-06 DIAGNOSIS — K219 Gastro-esophageal reflux disease without esophagitis: Secondary | ICD-10-CM | POA: Diagnosis not present

## 2023-02-06 DIAGNOSIS — R110 Nausea: Secondary | ICD-10-CM | POA: Diagnosis not present

## 2023-02-07 ENCOUNTER — Ambulatory Visit: Payer: BC Managed Care – PPO | Admitting: Nurse Practitioner

## 2023-02-15 ENCOUNTER — Ambulatory Visit (INDEPENDENT_AMBULATORY_CARE_PROVIDER_SITE_OTHER): Payer: BC Managed Care – PPO | Admitting: Nurse Practitioner

## 2023-02-15 ENCOUNTER — Encounter: Payer: Self-pay | Admitting: Nurse Practitioner

## 2023-02-15 VITALS — BP 120/86 | HR 92 | Temp 98.6°F | Resp 16 | Ht 63.0 in | Wt 131.0 lb

## 2023-02-15 DIAGNOSIS — F411 Generalized anxiety disorder: Secondary | ICD-10-CM | POA: Diagnosis not present

## 2023-02-15 DIAGNOSIS — F988 Other specified behavioral and emotional disorders with onset usually occurring in childhood and adolescence: Secondary | ICD-10-CM | POA: Diagnosis not present

## 2023-02-15 DIAGNOSIS — C49A3 Gastrointestinal stromal tumor of small intestine: Secondary | ICD-10-CM

## 2023-02-15 DIAGNOSIS — J3089 Other allergic rhinitis: Secondary | ICD-10-CM

## 2023-02-15 MED ORDER — AMPHETAMINE-DEXTROAMPHET ER 20 MG PO CP24: 20.0000 mg | ORAL_CAPSULE | ORAL | 0 refills | Status: DC

## 2023-02-15 MED ORDER — FEXOFENADINE-PSEUDOEPHED ER 60-120 MG PO TB12: 1.0000 | ORAL_TABLET | Freq: Two times a day (BID) | ORAL | 2 refills | Status: DC

## 2023-02-15 MED ORDER — AMPHETAMINE-DEXTROAMPHETAMINE 10 MG PO TABS: 10.0000 mg | ORAL_TABLET | Freq: Every day | ORAL | 0 refills | Status: DC | PRN

## 2023-02-15 MED ORDER — ALPRAZOLAM 0.5 MG PO TABS: 0.5000 mg | ORAL_TABLET | Freq: Every evening | ORAL | 2 refills | Status: DC | PRN

## 2023-02-15 NOTE — Progress Notes (Signed)
[facility] [address] [place], [place] [address]  Internal MEDICINE  Office Visit Note  Patient Name: Yesenia Ward  [id number]  [id number]  Date of Service: 02/15/2023  Chief Complaint  Patient presents with   Follow-up    Anxiety and adhd med refills     HPI Yesenia Ward presents for a follow-up visit for ADHD, anxiety, GIST, and allergic rhinitis.  Anxiety -- takes alprazolam as needed. Current dose and frequency are effective. No changes or significant issues at this time.  ADHD -- current adderall dose remains effective. Heart rate and BP are normal. Denies any palpitations or other adverse side effects of the medication. UDS done at her previous office visit.  GIST tumor -- still being monitored by [facility]. On Gleevec long term  Having trouble getting allergra-D prescription filled, wants to send it to [facility]     Current Medication: Outpatient Encounter Medications as of 02/15/2023  Medication Sig   acetaminophen (TYLENOL) 500 MG tablet Take 500 mg by mouth every 6 (six) hours as needed.   azelastine (ASTELIN) 0.1 % nasal spray Place 2 sprays into both nostrils 2 (two) times daily. Use in each nostril as directed   b complex vitamins capsule Take 1 capsule by mouth daily.   esomeprazole (NEXIUM) 40 MG capsule TAKE 1 CAPSULE TWICE A DAY BEFORE MEALS   Estradiol-Progesterone 1-100 MG CAPS Take by mouth.   fluocinonide cream (LIDEX) 0.05 % SMARTSIG:1 Topical Daily   FLUoxetine (PROZAC) 20 MG capsule TAKE 1 CAPSULE DAILY   imatinib (GLEEVEC) 100 MG tablet Take 100 mg by mouth 2 (two) times daily. Take with meals and large glass of water.Caution:Chemotherapy   levothyroxine (SYNTHROID) 125 MCG tablet TAKE 1 TABLET DAILY BEFORE BREAKFAST   ondansetron (ZOFRAN) 4 MG tablet TAKE 1 TABLET(4 MG) BY MOUTH EVERY 8 HOURS AS NEEDED FOR NAUSEA OR VOMITING   Oxymetazoline HCl (UPNEEQ) 0.1 % SOLN Apply to eye.   valACYclovir (VALTREX) 1000 MG tablet Take 1 tablet (1,000 mg  total) by mouth 2 (two) times daily. During outbreak/flare until resolved   [DISCONTINUED] ALPRAZolam (XANAX) 0.5 MG tablet Take 1-2 tablets (0.5-1 mg total) by mouth at bedtime as needed for anxiety. Take 1-2 tablets as needed at bedtime.   [DISCONTINUED] amphetamine-dextroamphetamine (ADDERALL XR) 20 MG 24 hr capsule Take 1 capsule (20 mg total) by mouth every morning.   [DISCONTINUED] amphetamine-dextroamphetamine (ADDERALL XR) 20 MG 24 hr capsule Take 1 capsule (20 mg total) by mouth every morning.   [DISCONTINUED] amphetamine-dextroamphetamine (ADDERALL XR) 20 MG 24 hr capsule Take 1 capsule (20 mg total) by mouth every morning.   [DISCONTINUED] amphetamine-dextroamphetamine (ADDERALL) 10 MG tablet Take 1 tablet (10 mg total) by mouth daily as needed (ADHD symptoms).   [DISCONTINUED] doxycycline (VIBRA-TABS) 100 MG tablet Take 1 tablet (100 mg total) by mouth 2 (two) times daily. Take with food.   [DISCONTINUED] fexofenadine-pseudoephedrine (ALLEGRA-D) 60-120 MG 12 hr tablet Take 1 tablet by mouth 2 (two) times daily.   [DISCONTINUED] methylPREDNISolone (MEDROL DOSEPAK) 4 MG TBPK tablet Take by mouth.   [DISCONTINUED] predniSONE (DELTASONE) 10 MG tablet Use per dose pack   ALPRAZolam (XANAX) 0.5 MG tablet Take 1-2 tablets (0.5-1 mg total) by mouth at bedtime as needed for anxiety. Take 1-2 tablets as needed at bedtime.   amphetamine-dextroamphetamine (ADDERALL XR) 20 MG 24 hr capsule Take 1 capsule (20 mg total) by mouth every morning.   [START ON 03/15/2023] amphetamine-dextroamphetamine (ADDERALL XR) 20 MG 24 hr capsule Take 1 capsule (20  mg total) by mouth every morning.   [START ON 04/12/2023] amphetamine-dextroamphetamine (ADDERALL XR) 20 MG 24 hr capsule Take 1 capsule (20 mg total) by mouth every morning.   amphetamine-dextroamphetamine (ADDERALL) 10 MG tablet Take 1 tablet (10 mg total) by mouth daily as needed (ADHD symptoms).   fexofenadine-pseudoephedrine (ALLEGRA-D) 60-120 MG 12 hr  tablet Take 1 tablet by mouth 2 (two) times daily.   No facility-administered encounter medications on file as of 02/15/2023.    Surgical History: Past Surgical History:  Procedure Laterality Date   AUGMENTATION MAMMAPLASTY Bilateral 2005   BREAST BIOPSY Right 10/18/2022   [place] Core Bx, Coil Clip - path pending   BREAST BIOPSY Right 10/18/2022   [place] RT BREAST BX W LOC DEV 1ST LESION IMG BX SPEC US GUIDE 10/18/2022 [facility]   gist tumor remooval     OVARIAN CYST REMOVAL Left    TUBAL LIGATION      Medical History: Past Medical History:  Diagnosis Date   GIST (gastrointestinal stromal tumor), non-malignant    Hypothyroid    Tumor     Family History: Family History  Problem Relation Age of Onset   Diabetes Mother    Coronary artery disease Mother    Cirrhosis Father    Cancer Maternal Uncle    Rectal cancer Paternal Aunt    Cancer Paternal Aunt     Social History   Socioeconomic History   Marital status: Married    Spouse name: Not on file   Number of children: Not on file   Years of education: Not on file   Highest education level: Not on file  Occupational History   Not on file  Tobacco Use   Smoking status: Former   Smokeless tobacco: Never  Vaping Use   Vaping Use: Never used  Substance and Sexual Activity   Alcohol use: Yes    Comment: occasionally   Drug use: Never   Sexual activity: Not on file  Other Topics Concern   Not on file  Social History Narrative   Not on file   Social Determinants of Health   Financial Resource Strain: Not on file  Food Insecurity: Not on file  Transportation Needs: Not on file  Physical Activity: Not on file  Stress: Not on file  Social Connections: Not on file  Intimate Partner Violence: Not on file      Review of Systems  Constitutional: Negative.  Negative for chills, fatigue and unexpected weight change.  HENT:  Negative for congestion, rhinorrhea, sneezing and sore throat.   Eyes:  Negative for  redness.  Respiratory: Negative.  Negative for cough, chest tightness and shortness of breath.   Cardiovascular: Negative.  Negative for chest pain and palpitations.  Gastrointestinal: Negative.  Negative for abdominal pain, constipation, diarrhea, nausea and vomiting.  Genitourinary:  Negative for dysuria and frequency.  Musculoskeletal: Negative.  Negative for arthralgias, back pain, joint swelling and neck pain.  Skin:  Negative for rash.  Neurological: Negative.  Negative for tremors and numbness.  Hematological:  Negative for adenopathy. Does not bruise/bleed easily.  Psychiatric/Behavioral:  Positive for sleep disturbance. Negative for behavioral problems (Depression), self-injury and suicidal ideas. The patient is nervous/anxious.     Vital Signs: BP 120/86   Pulse 92   Temp 98.6 F (37 C)   Resp 16   Ht 5\' 3"  (1.6 m)   Wt 131 lb (59.4 kg)   SpO2 95%   BMI 23.21 kg/m    Physical Exam Vitals reviewed.  Constitutional:      General: She is not in acute distress.    Appearance: Normal appearance. She is normal weight. She is not ill-appearing.  HENT:     Head: Normocephalic and atraumatic.  Eyes:     Pupils: Pupils are equal, round, and reactive to light.  Cardiovascular:     Rate and Rhythm: Normal rate and regular rhythm.  Pulmonary:     Effort: Pulmonary effort is normal. No respiratory distress.  Neurological:     Mental Status: She is alert and oriented to person, place, and time.     Cranial Nerves: No cranial nerve deficit.     Coordination: Coordination normal.     Gait: Gait normal.  Psychiatric:        Mood and Affect: Mood normal.        Behavior: Behavior normal.        Assessment/Plan: 1. Non-seasonal allergic rhinitis due to other allergic trigger Allegra-D prescription sent to [facility] pharmacy - fexofenadine-pseudoephedrine (ALLEGRA-D) 60-120 MG 12 hr tablet; Take 1 tablet by mouth 2 (two) times daily.  Dispense: 60 tablet; Refill: 2  2.  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 Continue Gleevec as prescribed.   3. Adult attention deficit disorder Continue adderall as prescribed. Follow up in 3 months for additional refills. UDS due at next office visit - amphetamine-dextroamphetamine (ADDERALL XR) 20 MG 24 hr capsule; Take 1 capsule (20 mg total) by mouth every morning.  Dispense: 30 capsule; Refill: 0 - amphetamine-dextroamphetamine (ADDERALL) 10 MG tablet; Take 1 tablet (10 mg total) by mouth daily as needed (ADHD symptoms).  Dispense: 90 tablet; Refill: 0 - amphetamine-dextroamphetamine (ADDERALL XR) 20 MG 24 hr capsule; Take 1 capsule (20 mg total) by mouth every morning.  Dispense: 30 capsule; Refill: 0 - amphetamine-dextroamphetamine (ADDERALL XR) 20 MG 24 hr capsule; Take 1 capsule (20 mg total) by mouth every morning.  Dispense: 30 capsule; Refill: 0  4. Generalized anxiety disorder Continue alprazolam prn as prescribed.  - ALPRAZolam (XANAX) 0.5 MG tablet; Take 1-2 tablets (0.5-1 mg total) by mouth at bedtime as needed for anxiety. Take 1-2 tablets as needed at bedtime.  Dispense: 60 tablet; Refill: 2    General Counseling: Artia verbalizes understanding of the findings of todays visit and agrees with plan of treatment. I have discussed any further diagnostic evaluation that may be needed or ordered today. We also reviewed her medications today. she has been encouraged to call the office with any questions or concerns that should arise related to todays visit.    No orders of the defined types were placed in this encounter.   Meds ordered this encounter  Medications   ALPRAZolam (XANAX) 0.5 MG tablet    Sig: Take 1-2 tablets (0.5-1 mg total) by mouth at bedtime as needed for anxiety. Take 1-2 tablets as needed at bedtime.    Dispense:  60 tablet    Refill:  2   amphetamine-dextroamphetamine (ADDERALL XR) 20 MG 24 hr capsule    Sig: Take 1  capsule (20 mg total) by mouth every morning.    Dispense:  30 capsule    Refill:  0    Please fill for june   amphetamine-dextroamphetamine (ADDERALL) 10 MG tablet    Sig: Take 1 tablet (10 mg total) by mouth daily as needed (ADHD symptoms).    Dispense:  90 tablet    Refill:  0    3 month supply of extra  additional dose to be taken after taking her 20 mg XR adderall dose   amphetamine-dextroamphetamine (ADDERALL XR) 20 MG 24 hr capsule    Sig: Take 1 capsule (20 mg total) by mouth every morning.    Dispense:  30 capsule    Refill:  0    Please fill for july   amphetamine-dextroamphetamine (ADDERALL XR) 20 MG 24 hr capsule    Sig: Take 1 capsule (20 mg total) by mouth every morning.    Dispense:  30 capsule    Refill:  0    Please fill for august   fexofenadine-pseudoephedrine (ALLEGRA-D) 60-120 MG 12 hr tablet    Sig: Take 1 tablet by mouth 2 (two) times daily.    Dispense:  60 tablet    Refill:  2    Please fill as prescription    Return in about 12 weeks (around 05/10/2023) for F/U, anxiety med refill, ADHD med check, Alynn Ellithorpe PCP.   Total time spent:30 Minutes Time spent includes review of chart, medications, test results, and follow up plan with the patient.   [place] Controlled Substance Database was reviewed by me.  This patient was seen by Sallyanne Kuster, FNP-C in collaboration with Dr. Beverely Risen as a part of collaborative care agreement.   Lucien Budney R. Tedd Sias, MSN, FNP-C Internal medicine

## 2023-03-07 ENCOUNTER — Emergency Department
Admission: EM | Admit: 2023-03-07 | Discharge: 2023-03-07 | Disposition: A | Discharge status: Home / Self Care | Payer: BC Managed Care – PPO | Attending: Emergency Medicine | Admitting: Emergency Medicine

## 2023-03-07 ENCOUNTER — Encounter: Payer: Self-pay | Admitting: Emergency Medicine

## 2023-03-07 ENCOUNTER — Other Ambulatory Visit: Payer: Self-pay

## 2023-03-07 DIAGNOSIS — S61412A Laceration without foreign body of left hand, initial encounter: Secondary | ICD-10-CM | POA: Insufficient documentation

## 2023-03-07 DIAGNOSIS — W298XXA Contact with other powered powered hand tools and household machinery, initial encounter: Secondary | ICD-10-CM | POA: Diagnosis not present

## 2023-03-07 DIAGNOSIS — E039 Hypothyroidism, unspecified: Secondary | ICD-10-CM | POA: Diagnosis not present

## 2023-03-07 MED ORDER — LIDOCAINE HCL (PF) 1 % IJ SOLN
5.0000 mL | Freq: Once | INTRAMUSCULAR | Status: AC
  Administered 2023-03-07: 5 mL via INTRADERMAL
  Filled 2023-03-07: qty 5

## 2023-03-07 MED ORDER — CEPHALEXIN 500 MG PO CAPS: 500.0000 mg | ORAL_CAPSULE | Freq: Two times a day (BID) | ORAL | 0 refills | Status: AC

## 2023-03-07 NOTE — ED Triage Notes (Signed)
Pt via POV from home. Pt states that she was using a drill last night to hang a light. Pt has a small laceration inbetween her thumb and index finger. Bleeding controlled. Report last tetanus was April 2020. Pt is A&Ox4 and NAD, ambulatory to triage.

## 2023-03-07 NOTE — Discharge Instructions (Signed)
Do not get the sutured area wet for 24 hours. After 24 hours, shower/bathe as usual and pat the area dry. Change the bandage 2 times per day and apply antibiotic ointment. Leave open to air when at no risk of getting the area dirty, but cover at night before bed. See your PCP or go to Urgent Care in 7-10 days for suture removal or sooner for signs or concern of infection.  

## 2023-03-07 NOTE — ED Provider Notes (Signed)
[facility] Provider Note    Event Date/Time   First MD Initiated Contact with Patient 03/07/23 [phone]     (approximate)   History   Puncture Wound   HPI  Yesenia Ward is a 57 y.o. female  with history of hypothyroidism, GERD, IDA,  and as listed in EMR presents to the emergency department for evaluation of laceration in the webbing of the left hand between thumb and index finger. She was hanging a light last night and the drill slipped. Bleeding controlled but wound reopens every time she spreads her fingers. Tdap booster is current.      Physical Exam   Triage Vital Signs: ED Triage Vitals  Enc Vitals Group     BP 03/07/23 0806 (!) 150/109     Pulse Rate 03/07/23 0806 94     Resp 03/07/23 0806 18     Temp 03/07/23 0805 97.7 F (36.5 C)     Temp Source 03/07/23 0805 Oral     SpO2 03/07/23 0806 100 %     Weight 03/07/23 0805 129 lb (58.5 kg)     Height 03/07/23 0805 5\' 3"  (1.6 m)     Head Circumference --      Peak Flow --      Pain Score --      Pain Loc --      Pain Edu? --      Excl. in GC? --     Most recent vital signs: Vitals:   03/07/23 0805 03/07/23 0806  BP:  (!) 150/109  Pulse:  94  Resp:  18  Temp: 97.7 F (36.5 C)   SpO2:  100%    General: Awake, no distress.  CV:  Good peripheral perfusion.  Resp:  Normal effort.  Abd:  No distention.  Other:  2cm laceration to web of left hand between thumb and index finger.   ED Results / Procedures / Treatments   Labs (all labs ordered are listed, but only abnormal results are displayed) Labs Reviewed - No data to display   EKG   RADIOLOGY  Image and radiology report reviewed and interpreted by me. Radiology report consistent with the same.  Not indicated  PROCEDURES:  Critical Care performed: No  ..Laceration Repair  Date/Time: 03/07/2023 9:40 AM  Performed by: Chinita Pester, FNP Authorized by: Chinita Pester, FNP   Consent:    Consent obtained:   Verbal   Consent given by:  Patient   Risks discussed:  Infection, pain and poor wound healing Universal protocol:    Procedure explained and questions answered to patient or proxy's satisfaction: yes     Patient identity confirmed:  Verbally with patient Anesthesia:    Anesthesia method:  Local infiltration   Local anesthetic:  Lidocaine 1% w/o epi Laceration details:    Location:  Hand   Hand location:  L palm   Length (cm):  2 Pre-procedure details:    Preparation:  Patient was prepped and draped in usual sterile fashion Exploration:    Imaging outcome: foreign body not noted     Wound exploration: entire depth of wound visualized   Treatment:    Area cleansed with:  Povidone-iodine and saline   Amount of cleaning:  Standard   Irrigation method:  Syringe Skin repair:    Repair method:  Sutures   Suture size:  4-0   Suture material:  Nylon   Suture technique:  Simple interrupted   Number of sutures:  3 Approximation:    Approximation:  Close Repair type:    Repair type:  Simple Post-procedure details:    Dressing:  Antibiotic ointment and sterile dressing   Procedure completion:  Tolerated well, no immediate complications    MEDICATIONS ORDERED IN ED:  Medications  lidocaine (PF) (XYLOCAINE) 1 % injection 5 mL (5 mLs Intradermal Given by Other 03/07/23 0920)     IMPRESSION / MDM / ASSESSMENT AND PLAN / ED COURSE   I have reviewed the triage note.  Differential diagnosis includes, but is not limited to, skin laceration, foreign body, fracture.  Patient's presentation is most consistent with acute complicated illness / injury requiring diagnostic workup.  Sutures necessary as wound opens each time she stretches out her fingers. Wound cleaned and repaired as described above. Home care instructions provided. She will have sutures removed by PCP or UC in 7-10 days. Keflex prescribed due to length of time since injury.      FINAL CLINICAL IMPRESSION(S) / ED  DIAGNOSES   Final diagnoses:  Laceration of left hand without foreign body, initial encounter     Rx / DC Orders   ED Discharge Orders          Ordered    cephALEXin (KEFLEX) 500 MG capsule  2 times daily        03/07/23 1610             Note:  This document was prepared using Dragon voice recognition software and may include unintentional dictation errors.   Chinita Pester, FNP 03/07/23 1431    Chesley Noon, MD 03/07/23 [phone]

## 2023-03-07 NOTE — ED Notes (Signed)
See triage notes. Patient was drilling a hole on the outside of her house last night when she punctured between her thumb and index finger

## 2023-03-27 ENCOUNTER — Other Ambulatory Visit: Payer: Self-pay | Admitting: Nurse Practitioner

## 2023-03-27 DIAGNOSIS — R1114 Bilious vomiting: Secondary | ICD-10-CM

## 2023-04-12 ENCOUNTER — Other Ambulatory Visit: Payer: Self-pay | Admitting: Nurse Practitioner

## 2023-04-12 DIAGNOSIS — J3089 Other allergic rhinitis: Secondary | ICD-10-CM

## 2023-04-26 ENCOUNTER — Other Ambulatory Visit: Payer: Self-pay | Admitting: Nurse Practitioner

## 2023-04-26 DIAGNOSIS — R1114 Bilious vomiting: Secondary | ICD-10-CM

## 2023-05-10 ENCOUNTER — Ambulatory Visit: Payer: BC Managed Care – PPO | Admitting: Nurse Practitioner

## 2023-05-12 ENCOUNTER — Encounter: Payer: Self-pay | Admitting: Nurse Practitioner

## 2023-05-12 ENCOUNTER — Ambulatory Visit: Payer: BC Managed Care – PPO | Admitting: Nurse Practitioner

## 2023-05-12 VITALS — BP 120/82 | HR 81 | Temp 98.3°F | Resp 16 | Ht 63.0 in | Wt 131.2 lb

## 2023-05-12 DIAGNOSIS — E039 Hypothyroidism, unspecified: Secondary | ICD-10-CM

## 2023-05-12 DIAGNOSIS — F411 Generalized anxiety disorder: Secondary | ICD-10-CM | POA: Diagnosis not present

## 2023-05-12 DIAGNOSIS — F988 Other specified behavioral and emotional disorders with onset usually occurring in childhood and adolescence: Secondary | ICD-10-CM | POA: Diagnosis not present

## 2023-05-12 MED ORDER — AMPHETAMINE-DEXTROAMPHET ER 20 MG PO CP24: 20.0000 mg | ORAL_CAPSULE | ORAL | 0 refills | Status: DC

## 2023-05-12 MED ORDER — LEVOTHYROXINE SODIUM 125 MCG PO TABS: 125.0000 ug | ORAL_TABLET | Freq: Every day | ORAL | 3 refills | Status: DC

## 2023-05-12 MED ORDER — AMPHETAMINE-DEXTROAMPHETAMINE 10 MG PO TABS: 10.0000 mg | ORAL_TABLET | Freq: Every day | ORAL | 0 refills | Status: DC | PRN

## 2023-05-12 MED ORDER — ALPRAZOLAM 0.5 MG PO TABS: 0.5000 mg | ORAL_TABLET | Freq: Every evening | ORAL | 2 refills | Status: DC | PRN

## 2023-05-12 NOTE — Progress Notes (Signed)
[facility] [address] [place], [place] [address]  Internal MEDICINE  Office Visit Note  Patient Name: Yesenia Ward  [id number]  [id number]  Date of Service: 05/12/2023  Chief Complaint  Patient presents with   Follow-up    Med check.     HPI Yesenia Ward presents for a follow-up visit for hypothyroidism, ADHD, anxiety.  Hypothyroidism -- takes levothyroxine, no issues ADHD -- current dose remains effective. BP and heart rate are normal. Denies any palpitations or other adverse side effects of the medication. Due for refills.  Anxiety -- takes alprazolam prn at bedtime to help her sleep and decrease her anxiety. No issues, due for refills.    Current Medication: Outpatient Encounter Medications as of 05/12/2023  Medication Sig   acetaminophen (TYLENOL) 500 MG tablet Take 500 mg by mouth every 6 (six) hours as needed.   ALLEGRA-D ALLERGY & CONGESTION 60-120 MG 12 hr tablet Take 1 tablet by mouth twice daily.   ALPRAZolam (XANAX) 0.5 MG tablet Take 1-2 tablets (0.5-1 mg total) by mouth at bedtime as needed for anxiety. Take 1-2 tablets as needed at bedtime.   azelastine (ASTELIN) 0.1 % nasal spray Place 2 sprays into both nostrils 2 (two) times daily. Use in each nostril as directed   b complex vitamins capsule Take 1 capsule by mouth daily.   esomeprazole (NEXIUM) 40 MG capsule TAKE 1 CAPSULE TWICE A DAY BEFORE MEALS   Estradiol-Progesterone 1-100 MG CAPS Take by mouth.   fluocinonide cream (LIDEX) 0.05 % SMARTSIG:1 Topical Daily   FLUoxetine (PROZAC) 20 MG capsule TAKE 1 CAPSULE DAILY   imatinib (GLEEVEC) 100 MG tablet Take 100 mg by mouth 2 (two) times daily. Take with meals and large glass of water.Caution:Chemotherapy   ondansetron (ZOFRAN) 4 MG tablet TAKE 1 TABLET(4 MG) BY MOUTH EVERY 8 HOURS AS NEEDED FOR NAUSEA OR VOMITING   Oxymetazoline HCl (UPNEEQ) 0.1 % SOLN Apply to eye.   valACYclovir (VALTREX) 1000 MG tablet Take 1 tablet (1,000 mg total) by mouth 2 (two)  times daily. During outbreak/flare until resolved   [DISCONTINUED] amphetamine-dextroamphetamine (ADDERALL XR) 20 MG 24 hr capsule Take 1 capsule (20 mg total) by mouth every morning.   [DISCONTINUED] amphetamine-dextroamphetamine (ADDERALL XR) 20 MG 24 hr capsule Take 1 capsule (20 mg total) by mouth every morning.   [DISCONTINUED] amphetamine-dextroamphetamine (ADDERALL XR) 20 MG 24 hr capsule Take 1 capsule (20 mg total) by mouth every morning.   [DISCONTINUED] amphetamine-dextroamphetamine (ADDERALL) 10 MG tablet Take 1 tablet (10 mg total) by mouth daily as needed (ADHD symptoms).   [DISCONTINUED] levothyroxine (SYNTHROID) 125 MCG tablet TAKE 1 TABLET DAILY BEFORE BREAKFAST   amphetamine-dextroamphetamine (ADDERALL XR) 20 MG 24 hr capsule Take 1 capsule (20 mg total) by mouth every morning.   [START ON 06/09/2023] amphetamine-dextroamphetamine (ADDERALL XR) 20 MG 24 hr capsule Take 1 capsule (20 mg total) by mouth every morning.   [START ON 07/07/2023] amphetamine-dextroamphetamine (ADDERALL XR) 20 MG 24 hr capsule Take 1 capsule (20 mg total) by mouth every morning.   amphetamine-dextroamphetamine (ADDERALL) 10 MG tablet Take 1 tablet (10 mg total) by mouth daily as needed (ADHD symptoms).   levothyroxine (SYNTHROID) 125 MCG tablet Take 1 tablet (125 mcg total) by mouth daily before breakfast.   No facility-administered encounter medications on file as of 05/12/2023.    Surgical History: Past Surgical History:  Procedure Laterality Date   AUGMENTATION MAMMAPLASTY Bilateral 2005   BREAST BIOPSY Right 10/18/2022   [place] Core Bx, Coil Clip -  path pending   BREAST BIOPSY Right 10/18/2022   [place] RT BREAST BX W LOC DEV 1ST LESION IMG BX SPEC US GUIDE 10/18/2022 [facility]   gist tumor remooval     OVARIAN CYST REMOVAL Left    TUBAL LIGATION      Medical History: Past Medical History:  Diagnosis Date   GIST (gastrointestinal stromal tumor), non-malignant    Hypothyroid    Tumor      Family History: Family History  Problem Relation Age of Onset   Diabetes Mother    Coronary artery disease Mother    Cirrhosis Father    Cancer Maternal Uncle    Rectal cancer Paternal Aunt    Cancer Paternal Aunt     Social History   Socioeconomic History   Marital status: Married    Spouse name: Not on file   Number of children: Not on file   Years of education: Not on file   Highest education level: Not on file  Occupational History   Not on file  Tobacco Use   Smoking status: Former   Smokeless tobacco: Never  Vaping Use   Vaping status: Never Used  Substance and Sexual Activity   Alcohol use: Yes    Comment: occasionally   Drug use: Never   Sexual activity: Not on file  Other Topics Concern   Not on file  Social History Narrative   Not on file   Social Determinants of Health   Financial Resource Strain: Not on file  Food Insecurity: Not on file  Transportation Needs: Not on file  Physical Activity: Not on file  Stress: Not on file  Social Connections: Not on file  Intimate Partner Violence: Not on file      Review of Systems  Constitutional:  Negative for chills, fatigue and unexpected weight change.  HENT:  Negative for congestion, rhinorrhea, sneezing and sore throat.   Eyes:  Negative for redness.  Respiratory:  Negative for cough, chest tightness and shortness of breath.   Cardiovascular:  Negative for chest pain and palpitations.  Gastrointestinal:  Negative for abdominal pain, constipation, diarrhea, nausea and vomiting.  Genitourinary:  Negative for dysuria and frequency.  Musculoskeletal:  Negative for arthralgias, back pain, joint swelling and neck pain.  Skin:  Negative for rash.  Neurological: Negative.  Negative for tremors and numbness.  Hematological:  Negative for adenopathy. Does not bruise/bleed easily.  Psychiatric/Behavioral:  Negative for behavioral problems (Depression), sleep disturbance and suicidal ideas. The patient is  not nervous/anxious.     Vital Signs: BP 120/82   Pulse 81   Temp 98.3 F (36.8 C)   Resp 16   Ht 5\' 3"  (1.6 m)   Wt 131 lb 3.2 oz (59.5 kg)   SpO2 98%   BMI 23.24 kg/m    Physical Exam Vitals reviewed.  Constitutional:      General: She is not in acute distress.    Appearance: Normal appearance. She is normal weight. She is not ill-appearing.  HENT:     Head: Normocephalic and atraumatic.  Eyes:     Pupils: Pupils are equal, round, and reactive to light.  Cardiovascular:     Rate and Rhythm: Normal rate and regular rhythm.  Pulmonary:     Effort: Pulmonary effort is normal. No respiratory distress.  Neurological:     Mental Status: She is alert and oriented to person, place, and time.     Cranial Nerves: No cranial nerve deficit.     Coordination: Coordination  normal.     Gait: Gait normal.  Psychiatric:        Mood and Affect: Mood normal.        Behavior: Behavior normal.        Assessment/Plan: 1. Acquired hypothyroidism Stable, continue levothyroxine as prescribed.  - levothyroxine (SYNTHROID) 125 MCG tablet; Take 1 tablet (125 mcg total) by mouth daily before breakfast.  Dispense: 90 tablet; Refill: 3  2. Adult attention deficit disorder Continue adderall as prescribed. Follow up in 3 months for additional refills. UDS due at next visit.  - amphetamine-dextroamphetamine (ADDERALL) 10 MG tablet; Take 1 tablet (10 mg total) by mouth daily as needed (ADHD symptoms).  Dispense: 90 tablet; Refill: 0 - amphetamine-dextroamphetamine (ADDERALL XR) 20 MG 24 hr capsule; Take 1 capsule (20 mg total) by mouth every morning.  Dispense: 30 capsule; Refill: 0 - amphetamine-dextroamphetamine (ADDERALL XR) 20 MG 24 hr capsule; Take 1 capsule (20 mg total) by mouth every morning.  Dispense: 30 capsule; Refill: 0 - amphetamine-dextroamphetamine (ADDERALL XR) 20 MG 24 hr capsule; Take 1 capsule (20 mg total) by mouth every morning.  Dispense: 30 capsule; Refill: 0  3.  Generalized anxiety disorder Continue alprazolam as prescribed. Follow up in 3 months for additional refills. UDS due at next visit.  - ALPRAZolam (XANAX) 0.5 MG tablet; Take 1-2 tablets (0.5-1 mg total) by mouth at bedtime as needed for anxiety. Take 1-2 tablets as needed at bedtime.  Dispense: 60 tablet; Refill: 2   General Counseling: Nari verbalizes understanding of the findings of todays visit and agrees with plan of treatment. I have discussed any further diagnostic evaluation that may be needed or ordered today. We also reviewed her medications today. she has been encouraged to call the office with any questions or concerns that should arise related to todays visit.    No orders of the defined types were placed in this encounter.   Meds ordered this encounter  Medications   amphetamine-dextroamphetamine (ADDERALL) 10 MG tablet    Sig: Take 1 tablet (10 mg total) by mouth daily as needed (ADHD symptoms).    Dispense:  90 tablet    Refill:  0    3 month supply of extra additional dose to be taken after taking her 20 mg XR adderall dose,   amphetamine-dextroamphetamine (ADDERALL XR) 20 MG 24 hr capsule    Sig: Take 1 capsule (20 mg total) by mouth every morning.    Dispense:  30 capsule    Refill:  0    Please fill for September   amphetamine-dextroamphetamine (ADDERALL XR) 20 MG 24 hr capsule    Sig: Take 1 capsule (20 mg total) by mouth every morning.    Dispense:  30 capsule    Refill:  0    Please fill for October   amphetamine-dextroamphetamine (ADDERALL XR) 20 MG 24 hr capsule    Sig: Take 1 capsule (20 mg total) by mouth every morning.    Dispense:  30 capsule    Refill:  0    Please fill for november   levothyroxine (SYNTHROID) 125 MCG tablet    Sig: Take 1 tablet (125 mcg total) by mouth daily before breakfast.    Dispense:  90 tablet    Refill:  3    Return in about 12 weeks (around 08/04/2023) for F/U, ADHD med check, Katriel Cutsforth PCP, need for UDS and CPE.   Total  time spent:30 Minutes Time spent includes review of chart, medications, test results, and follow up  plan with the patient.   [place] Controlled Substance Database was reviewed by me.  This patient was seen by Sallyanne Kuster, FNP-C in collaboration with Dr. Beverely Risen as a part of collaborative care agreement.   Jarious Lyon R. Tedd Sias, MSN, FNP-C Internal medicine

## 2023-05-22 ENCOUNTER — Encounter: Payer: BC Managed Care – PPO | Admitting: Nurse Practitioner

## 2023-05-27 ENCOUNTER — Other Ambulatory Visit: Payer: Self-pay | Admitting: Nurse Practitioner

## 2023-05-27 DIAGNOSIS — R1114 Bilious vomiting: Secondary | ICD-10-CM

## 2023-05-31 NOTE — Telephone Encounter (Signed)
Ok to send

## 2023-06-04 ENCOUNTER — Other Ambulatory Visit: Payer: Self-pay | Admitting: Nurse Practitioner

## 2023-06-04 DIAGNOSIS — J3089 Other allergic rhinitis: Secondary | ICD-10-CM

## 2023-07-01 ENCOUNTER — Other Ambulatory Visit: Payer: Self-pay | Admitting: Nurse Practitioner

## 2023-07-01 DIAGNOSIS — R1114 Bilious vomiting: Secondary | ICD-10-CM

## 2023-07-04 ENCOUNTER — Other Ambulatory Visit: Payer: Self-pay | Admitting: Nurse Practitioner

## 2023-07-04 DIAGNOSIS — J3089 Other allergic rhinitis: Secondary | ICD-10-CM

## 2023-08-08 DIAGNOSIS — K668 Other specified disorders of peritoneum: Secondary | ICD-10-CM | POA: Diagnosis not present

## 2023-08-08 DIAGNOSIS — C49A3 Gastrointestinal stromal tumor of small intestine: Secondary | ICD-10-CM | POA: Diagnosis not present

## 2023-08-09 ENCOUNTER — Encounter: Payer: BC Managed Care – PPO | Admitting: Nurse Practitioner

## 2023-08-15 ENCOUNTER — Ambulatory Visit (INDEPENDENT_AMBULATORY_CARE_PROVIDER_SITE_OTHER): Payer: BC Managed Care – PPO | Admitting: Nurse Practitioner

## 2023-08-15 ENCOUNTER — Encounter: Payer: Self-pay | Admitting: Nurse Practitioner

## 2023-08-15 VITALS — BP 132/86 | HR 85 | Temp 98.3°F | Resp 16 | Ht 63.0 in | Wt 131.4 lb

## 2023-08-15 DIAGNOSIS — R7303 Prediabetes: Secondary | ICD-10-CM

## 2023-08-15 DIAGNOSIS — G8929 Other chronic pain: Secondary | ICD-10-CM

## 2023-08-15 DIAGNOSIS — F988 Other specified behavioral and emotional disorders with onset usually occurring in childhood and adolescence: Secondary | ICD-10-CM

## 2023-08-15 DIAGNOSIS — F411 Generalized anxiety disorder: Secondary | ICD-10-CM | POA: Diagnosis not present

## 2023-08-15 DIAGNOSIS — M546 Pain in thoracic spine: Secondary | ICD-10-CM | POA: Diagnosis not present

## 2023-08-15 DIAGNOSIS — E039 Hypothyroidism, unspecified: Secondary | ICD-10-CM

## 2023-08-15 DIAGNOSIS — Z0001 Encounter for general adult medical examination with abnormal findings: Secondary | ICD-10-CM

## 2023-08-15 MED ORDER — METFORMIN HCL ER 500 MG PO TB24: 500.0000 mg | ORAL_TABLET | Freq: Every day | ORAL | 1 refills | Status: DC

## 2023-08-15 MED ORDER — AMPHETAMINE-DEXTROAMPHET ER 10 MG PO CP24: 10.0000 mg | ORAL_CAPSULE | Freq: Every day | ORAL | 0 refills | Status: DC

## 2023-08-15 MED ORDER — ALPRAZOLAM 0.5 MG PO TABS: 0.5000 mg | ORAL_TABLET | Freq: Every evening | ORAL | 2 refills | Status: DC | PRN

## 2023-08-15 MED ORDER — AMPHETAMINE-DEXTROAMPHET ER 15 MG PO CP24: 15.0000 mg | ORAL_CAPSULE | ORAL | 0 refills | Status: DC

## 2023-08-15 NOTE — Progress Notes (Unsigned)
[facility] [address] [place], [place] [address]  Internal MEDICINE  Office Visit Note  Patient Name: Yesenia Ward  [id number]  [id number]  Date of Service: 08/15/2023  Chief Complaint  Patient presents with   Annual Exam    HPI Yesenia Ward presents for an annual well visit and physical exam.  Well-appearing 57 y.o. female with hypothyroidism, GIST tumor s/p surgical removal, GERD, insomnia, iron deficiency anemia, GAD, low vitamin D and ADD.  --sees OBGYN, Dr. Dalbert Garnet at [facility] clinic for women's health -- paps and mammo.  Routine CRC screening: due in 2029  Labs: reviewed labs with patient  A1c 5.7 prediabetic Cholesterol labs abnormal -- LDL >200, triglycerides are 400.  New or worsening pain: having increased thoracic back pain that has been gradually worsening over the past 3 months. [place] in [place] that showed a hemangioma on her T10 vertebrae of there thoracic spine.  Pap done 03/16/2022    Current Medication: Outpatient Encounter Medications as of 08/15/2023  Medication Sig   [START ON 09/28/2023] amphetamine-dextroamphetamine (ADDERALL XR) 10 MG 24 hr capsule Take 1 capsule (10 mg total) by mouth daily.   [START ON 10/26/2023] amphetamine-dextroamphetamine (ADDERALL XR) 10 MG 24 hr capsule Take 1 capsule (10 mg total) by mouth daily.   [START ON 08/31/2023] amphetamine-dextroamphetamine (ADDERALL XR) 15 MG 24 hr capsule Take 1 capsule by mouth every morning.   metFORMIN (GLUCOPHAGE-XR) 500 MG 24 hr tablet Take 1 tablet (500 mg total) by mouth daily with breakfast.   acetaminophen (TYLENOL) 500 MG tablet Take 500 mg by mouth every 6 (six) hours as needed.   ALPRAZolam (XANAX) 0.5 MG tablet Take 1-2 tablets (0.5-1 mg total) by mouth at bedtime as needed for anxiety. Take 1-2 tablets as needed at bedtime.   amphetamine-dextroamphetamine (ADDERALL) 10 MG tablet Take 1 tablet (10 mg total) by mouth daily as needed (ADHD symptoms).   azelastine  (ASTELIN) 0.1 % nasal spray Place 2 sprays into both nostrils 2 (two) times daily. Use in each nostril as directed   b complex vitamins capsule Take 1 capsule by mouth daily.   esomeprazole (NEXIUM) 40 MG capsule TAKE 1 CAPSULE TWICE A DAY BEFORE MEALS   fexofenadine-pseudoephedrine (ALLEGRA-D ALLERGY & CONGESTION) 60-120 MG 12 hr tablet Take 1 tablet by mouth twice daily.   fluocinonide cream (LIDEX) 0.05 % SMARTSIG:1 Topical Daily   imatinib (GLEEVEC) 100 MG tablet Take 100 mg by mouth 2 (two) times daily. Take with meals and large glass of water.Caution:Chemotherapy   levothyroxine (SYNTHROID) 125 MCG tablet Take 1 tablet (125 mcg total) by mouth daily before breakfast.   ondansetron (ZOFRAN) 4 MG tablet TAKE 1 TABLET(4 MG) BY MOUTH EVERY 8 HOURS AS NEEDED FOR NAUSEA OR VOMITING   Oxymetazoline HCl (UPNEEQ) 0.1 % SOLN Apply to eye.   valACYclovir (VALTREX) 1000 MG tablet Take 1 tablet (1,000 mg total) by mouth 2 (two) times daily. During outbreak/flare until resolved   [DISCONTINUED] ALPRAZolam (XANAX) 0.5 MG tablet Take 1-2 tablets (0.5-1 mg total) by mouth at bedtime as needed for anxiety. Take 1-2 tablets as needed at bedtime.   [DISCONTINUED] amphetamine-dextroamphetamine (ADDERALL XR) 20 MG 24 hr capsule Take 1 capsule (20 mg total) by mouth every morning.   [DISCONTINUED] amphetamine-dextroamphetamine (ADDERALL XR) 20 MG 24 hr capsule Take 1 capsule (20 mg total) by mouth every morning.   [DISCONTINUED] amphetamine-dextroamphetamine (ADDERALL XR) 20 MG 24 hr capsule Take 1 capsule (20 mg total) by mouth every  morning.   [DISCONTINUED] Estradiol-Progesterone 1-100 MG CAPS Take by mouth.   [DISCONTINUED] FLUoxetine (PROZAC) 20 MG capsule TAKE 1 CAPSULE DAILY   No facility-administered encounter medications on file as of 08/15/2023.    Surgical History: Past Surgical History:  Procedure Laterality Date   AUGMENTATION MAMMAPLASTY Bilateral 2005   BREAST BIOPSY Right 10/18/2022   [place] Core  Bx, Coil Clip - path pending   BREAST BIOPSY Right 10/18/2022   [place] RT BREAST BX W LOC DEV 1ST LESION IMG BX SPEC US GUIDE 10/18/2022 [facility]   gist tumor remooval     OVARIAN CYST REMOVAL Left    TUBAL LIGATION      Medical History: Past Medical History:  Diagnosis Date   GIST (gastrointestinal stromal tumor), non-malignant    Hypothyroid    Tumor     Family History: Family History  Problem Relation Age of Onset   Diabetes Mother    Coronary artery disease Mother    Cirrhosis Father    Cancer Maternal Uncle    Rectal cancer Paternal Aunt    Cancer Paternal Aunt     Social History   Socioeconomic History   Marital status: Married    Spouse name: Not on file   Number of children: Not on file   Years of education: Not on file   Highest education level: Not on file  Occupational History   Not on file  Tobacco Use   Smoking status: Former   Smokeless tobacco: Never  Vaping Use   Vaping status: Never Used  Substance and Sexual Activity   Alcohol use: Yes    Comment: occasionally   Drug use: Never   Sexual activity: Not on file  Other Topics Concern   Not on file  Social History Narrative   Not on file   Social Drivers of Health   Financial Resource Strain: Not on file  Food Insecurity: Not on file  Transportation Needs: Not on file  Physical Activity: Not on file  Stress: Not on file  Social Connections: Not on file  Intimate Partner Violence: Not on file      Review of Systems  Constitutional:  Negative for activity change, appetite change, chills, fatigue, fever and unexpected weight change.  HENT: Negative.  Negative for congestion, ear pain, rhinorrhea, sore throat and trouble swallowing.   Eyes: Negative.   Respiratory: Negative.  Negative for cough, chest tightness, shortness of breath and wheezing.   Cardiovascular: Negative.  Negative for chest pain and palpitations.  Gastrointestinal:  Positive for nausea (has prescription for zofran  because Gleevec makes her nauseated.). Negative for abdominal pain, blood in stool, constipation, diarrhea and vomiting.       GIST tumor removed and is on Gleevec, gets yearly CT abdomen to assess for new tumors or mets.   Endocrine: Negative.   Genitourinary: Negative.  Negative for difficulty urinating, dysuria, frequency, hematuria and urgency.  Musculoskeletal:  Positive for arthralgias and back pain. Negative for joint swelling, myalgias and neck pain.  Skin: Negative.  Negative for rash and wound.  Allergic/Immunologic: Negative.  Negative for immunocompromised state.  Neurological: Negative.  Negative for dizziness, seizures, light-headedness, numbness and headaches.  Hematological: Negative.   Psychiatric/Behavioral:  Positive for decreased concentration (ADD symptoms controlled with adderall XR) and sleep disturbance (takes alprazolam for sleep as needed.). Negative for behavioral problems, self-injury and suicidal ideas. The patient is nervous/anxious.     Vital Signs: BP 132/86   Pulse 85   Temp 98.3 F (36.8  C)   Resp 16   Ht 5\' 3"  (1.6 m)   Wt 131 lb 6.4 oz (59.6 kg)   SpO2 99%   BMI 23.28 kg/m    Physical Exam Vitals reviewed.  Constitutional:      General: She is awake. She is not in acute distress.    Appearance: Normal appearance. She is well-developed, well-groomed and normal weight. She is not ill-appearing or diaphoretic.  HENT:     Head: Normocephalic and atraumatic.     Right Ear: Tympanic membrane, ear canal and external ear normal.     Left Ear: Tympanic membrane, ear canal and external ear normal.     Nose: Nose normal. No congestion or rhinorrhea.     Mouth/Throat:     Lips: Pink.     Mouth: Mucous membranes are moist.     Pharynx: Oropharynx is clear. Uvula midline. No oropharyngeal exudate or posterior oropharyngeal erythema.  Eyes:     General: Lids are normal. Vision grossly intact. Gaze aligned appropriately. No scleral icterus.       Right  eye: No discharge.        Left eye: No discharge.     Extraocular Movements: Extraocular movements intact.     Conjunctiva/sclera: Conjunctivae normal.     Pupils: Pupils are equal, round, and reactive to light.     Funduscopic exam:    Right eye: Red reflex present.        Left eye: Red reflex present. Neck:     Thyroid: No thyromegaly.     Vascular: No JVD.     Trachea: Trachea and phonation normal. No tracheal deviation.  Cardiovascular:     Rate and Rhythm: Normal rate and regular rhythm.     Pulses:          Carotid pulses are 3+ on the right side and 3+ on the left side.      Radial pulses are 2+ on the right side and 2+ on the left side.       Posterior tibial pulses are 1+ on the right side and 1+ on the left side.     Heart sounds: Normal heart sounds, S1 normal and S2 normal. No murmur heard.    No friction rub. No gallop.  Pulmonary:     Effort: Pulmonary effort is normal. No accessory muscle usage or respiratory distress.     Breath sounds: Normal breath sounds and air entry. No stridor. No wheezing or rales.  Chest:     Chest wall: No tenderness.     Comments: Declined clinical breast exam, will schedule mammogram.  Abdominal:     General: Bowel sounds are normal. There is no distension.     Palpations: Abdomen is soft. There is no mass.     Tenderness: There is no abdominal tenderness. There is no guarding or rebound.  Musculoskeletal:        General: No tenderness or deformity. Normal range of motion.     Cervical back: Normal range of motion and neck supple.     Right lower leg: No edema.     Left lower leg: No edema.  Lymphadenopathy:     Cervical: No cervical adenopathy.  Skin:    General: Skin is warm and dry.     Capillary Refill: Capillary refill takes less than 2 seconds.     Coloration: Skin is not pale.     Findings: No erythema or rash.  Neurological:     Mental Status: She  is alert and oriented to person, place, and time.     Cranial Nerves: No  cranial nerve deficit.     Motor: No abnormal muscle tone.     Coordination: Coordination normal.     Gait: Gait normal.     Deep Tendon Reflexes: Reflexes are normal and symmetric.  Psychiatric:        Attention and Perception: Attention normal.        Mood and Affect: Mood and affect normal.        Behavior: Behavior normal. Behavior is cooperative.        Thought Content: Thought content normal.        Judgment: Judgment normal.       Assessment/Plan: 1. Encounter for routine adult health examination with abnormal findings Age-appropriate preventive screenings and vaccinations discussed, annual physical exam completed. Routine labs for health maintenance results reviewed with patient. PHM updated.    2. Prediabetes Start metformin as prescribed. Follow up in 3 months, will repeat A1c and cholesterol levels  - metFORMIN (GLUCOPHAGE-XR) 500 MG 24 hr tablet; Take 1 tablet (500 mg total) by mouth daily with breakfast.  Dispense: 90 tablet; Refill: 1  3. Acquired hypothyroidism Continue levothyroxine as prescribed.   4. Chronic thoracic spine pain (Primary) Referred to spine specialist/neurosurgery - Ambulatory referral to Spine Surgery  5. Generalized anxiety disorder Continue prn alprazolam as prescribed. Follow up in 3 months for additional refills. UDS will be done at next office visit.  - ALPRAZolam (XANAX) 0.5 MG tablet; Take 1-2 tablets (0.5-1 mg total) by mouth at bedtime as needed for anxiety. Take 1-2 tablets as needed at bedtime.  Dispense: 60 tablet; Refill: 2  6. Adult attention deficit disorder Continue adderall xr at the lower doses, will start with 15 mg x1 month and then decrease to 10 mg daily. Follow up in 3 months for additional refills. Reminded patient that she will have a UDS done at her next visit.  - amphetamine-dextroamphetamine (ADDERALL XR) 15 MG 24 hr capsule; Take 1 capsule by mouth every morning.  Dispense: 30 capsule; Refill: 0 -  amphetamine-dextroamphetamine (ADDERALL XR) 10 MG 24 hr capsule; Take 1 capsule (10 mg total) by mouth daily.  Dispense: 30 capsule; Refill: 0 - amphetamine-dextroamphetamine (ADDERALL XR) 10 MG 24 hr capsule; Take 1 capsule (10 mg total) by mouth daily.  Dispense: 30 capsule; Refill: 0     General Counseling: Yesenia Ward verbalizes understanding of the findings of todays visit and agrees with plan of treatment. I have discussed any further diagnostic evaluation that may be needed or ordered today. We also reviewed her medications today. she has been encouraged to call the office with any questions or concerns that should arise related to todays visit.    Orders Placed This Encounter  Procedures   Ambulatory referral to Spine Surgery    Meds ordered this encounter  Medications   metFORMIN (GLUCOPHAGE-XR) 500 MG 24 hr tablet    Sig: Take 1 tablet (500 mg total) by mouth daily with breakfast.    Dispense:  90 tablet    Refill:  1    Prediabetes and high cholesterol   ALPRAZolam (XANAX) 0.5 MG tablet    Sig: Take 1-2 tablets (0.5-1 mg total) by mouth at bedtime as needed for anxiety. Take 1-2 tablets as needed at bedtime.    Dispense:  60 tablet    Refill:  2   amphetamine-dextroamphetamine (ADDERALL XR) 15 MG 24 hr capsule    Sig: Take 1 capsule  by mouth every morning.    Dispense:  30 capsule    Refill:  0    Fill for January 2   amphetamine-dextroamphetamine (ADDERALL XR) 10 MG 24 hr capsule    Sig: Take 1 capsule (10 mg total) by mouth daily.    Dispense:  30 capsule    Refill:  0    Fill for January 30   amphetamine-dextroamphetamine (ADDERALL XR) 10 MG 24 hr capsule    Sig: Take 1 capsule (10 mg total) by mouth daily.    Dispense:  30 capsule    Refill:  0    Fill for February 27    Return in about 3 months (around 11/08/2023) for F/U, ADHD med check, Yesenia Ward PCP, UDS due next visit. .   Total time spent:30 Minutes Time spent includes review of chart, medications, test  results, and follow up plan with the patient.   [place] Controlled Substance Database was reviewed by me.  This patient was seen by Sallyanne Kuster, FNP-C in collaboration with Dr. Beverely Risen as a part of collaborative care agreement.  Latoyia Tecson R. Tedd Sias, MSN, FNP-C Internal medicine

## 2023-08-16 ENCOUNTER — Encounter: Payer: Self-pay | Admitting: Nurse Practitioner

## 2023-08-16 DIAGNOSIS — G8929 Other chronic pain: Secondary | ICD-10-CM | POA: Insufficient documentation

## 2023-08-16 DIAGNOSIS — R7303 Prediabetes: Secondary | ICD-10-CM | POA: Insufficient documentation

## 2023-09-11 NOTE — Progress Notes (Signed)
 Referring Physician:  Liana Fish, NP [address] [place],  [place] [address]  Primary Physician:  Liana Fish, NP  History of Present Illness: 09/12/2023 Ms. 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   MRI in [place] in 2015 that showed T10 hemangioma. Also showed possible hemangioma in her liver. She has been following for years since her malignant GI stromal tumor (GIST) of small intestine  with repeat scanning.   She has chronic diffuse pain from her neck to her back. Primary complaint today is constant mid to upper LBP x 4-6 months. No radicular rib pain. Pain can be pounding or throbbing. She's had chronic constant LBP x years. No leg pain. She has some bilateral posterior thigh numbness and weakness.   Pain is worse with prolonged standing, lifting, and sleeping. Some relief with rest and tylenol.   She takes prn tylenol.   Bowel/Bladder Dysfunction: no urgency, no bowel issues, no perineal numbness. Notes some dribbling when she stands up after voiding x months. This is not all the time.   She does not smoke.   Conservative measures:  Physical therapy:  has not participated in Multimodal medical therapy including regular antiinflammatories:  tylenol Injections:  no epidural steroid injections  Past Surgery: no spinal surgeries  Yesenia Ward has no symptoms of cervical myelopathy.  The symptoms are causing a significant impact on the patient's life.   Review of Systems:  A 10 point review of systems is negative, except for the pertinent positives and negatives detailed in the HPI.  Past Medical History: Past Medical History:  Diagnosis Date   GIST (gastrointestinal stromal tumor), non-malignant    Hypothyroid    Tumor     Past Surgical History: Past Surgical History:  Procedure Laterality Date   AUGMENTATION MAMMAPLASTY  Bilateral 2005   BREAST BIOPSY Right 10/18/2022   Us  Core Bx, Coil Clip - path pending   BREAST BIOPSY Right 10/18/2022   US  RT BREAST BX W LOC DEV 1ST LESION IMG BX SPEC US  GUIDE 10/18/2022 [facility]   gist tumor remooval     OVARIAN CYST REMOVAL Left    RESECTION SMALL BOWEL / CLOSURE ILEOSTOMY      Allergies: Allergies as of 09/12/2023 - Review Complete 09/12/2023  Allergen Reaction Noted   Gabapentin Anaphylaxis 05/01/2018   Amoxicillin-pot clavulanate Nausea And Vomiting 05/01/2018   Other Swelling 05/01/2018   Soy allergy (do not select)  05/01/2018   Sulfamethoxazole-trimethoprim Other (See Comments) 05/01/2018    Medications: Outpatient Encounter Medications as of 09/12/2023  Medication Sig   acetaminophen (TYLENOL) 500 MG tablet Take 500 mg by mouth every 6 (six) hours as needed.   ALPRAZolam  (XANAX ) 0.5 MG tablet Take 1-2 tablets (0.5-1 mg total) by mouth at bedtime as needed for anxiety. Take 1-2 tablets as needed at bedtime.   [START ON 09/28/2023] amphetamine -dextroamphetamine  (ADDERALL XR) 10 MG 24 hr capsule Take 1 capsule (10 mg total) by mouth daily.   [START ON 10/26/2023] amphetamine -dextroamphetamine  (ADDERALL XR) 10 MG 24 hr capsule Take 1 capsule (10 mg total) by mouth daily.   amphetamine -dextroamphetamine  (ADDERALL XR) 15 MG 24 hr capsule Take 1 capsule by mouth every morning.   amphetamine -dextroamphetamine  (ADDERALL) 10 MG tablet Take 1 tablet (10 mg total) by mouth daily as needed (ADHD symptoms).   azelastine  (ASTELIN ) 0.1 % nasal spray Place 2 sprays into both nostrils 2 (two) times daily.  Use in each nostril as directed   b complex vitamins capsule Take 1 capsule by mouth daily.   esomeprazole  (NEXIUM ) 40 MG capsule TAKE 1 CAPSULE TWICE A DAY BEFORE MEALS   fexofenadine -pseudoephedrine (ALLEGRA-D ALLERGY & CONGESTION) 60-120 MG 12 hr tablet Take 1 tablet by mouth twice daily.   fluocinonide  cream (LIDEX ) 0.05 % SMARTSIG:1 Topical Daily   imatinib  (GLEEVEC) 100 MG tablet Take 100 mg by mouth 2 (two) times daily. Take with meals and large glass of water.Caution:Chemotherapy   levothyroxine  (SYNTHROID ) 125 MCG tablet Take 1 tablet (125 mcg total) by mouth daily before breakfast.   metFORMIN  (GLUCOPHAGE -XR) 500 MG 24 hr tablet Take 1 tablet (500 mg total) by mouth daily with breakfast.   ondansetron  (ZOFRAN ) 4 MG tablet TAKE 1 TABLET(4 MG) BY MOUTH EVERY 8 HOURS AS NEEDED FOR NAUSEA OR VOMITING   Oxymetazoline HCl (UPNEEQ) 0.1 % SOLN Apply to eye.   valACYclovir  (VALTREX ) 1000 MG tablet Take 1 tablet (1,000 mg total) by mouth 2 (two) times daily. During outbreak/flare until resolved   No facility-administered encounter medications on file as of 09/12/2023.    Social History: Social History   Tobacco Use   Smoking status: Former   Smokeless tobacco: Never  Advertising Account Planner   Vaping status: Never Used  Substance Use Topics   Alcohol use: Yes    Comment: occasionally   Drug use: Never    Family Medical History: Family History  Problem Relation Age of Onset   Diabetes Mother    Coronary artery disease Mother    Cirrhosis Father    Cancer Maternal Uncle    Rectal cancer Paternal Aunt    Cancer Paternal Aunt     Physical Examination: Vitals:   09/12/23 1428  BP: 138/80    General: Patient is well developed, well nourished, calm, collected, and in no apparent distress. Attention to examination is appropriate.  Respiratory: Patient is breathing without any difficulty.   NEUROLOGICAL:     Awake, alert, oriented to person, place, and time.  Speech is clear and fluent. Fund of knowledge is appropriate.   Cranial Nerves: Pupils equal round and reactive to light.  Facial tone is symmetric.    No posterior cervical tenderness. Mild tenderness in bilateral trapezial region.   She has diffuse thoracic tenderness, worse at TL junction area.   Diffuse lower posterior lumbar tenderness.   No abnormal lesions on exposed skin.    Strength: Side Biceps Triceps Deltoid Interossei Grip Wrist Ext. Wrist Flex.  R 5 5 5 5 5 5 5   L 5 5 5 5 5 5 5    Side Iliopsoas Quads Hamstring PF DF EHL  R 5 5 5 5 5 5   L 5 5 5 5 5 5    Reflexes are 2+ and symmetric at the biceps, brachioradialis, patella and achilles.   Hoffman's is absent.  Clonus is not present.   Bilateral upper and lower extremity sensation is intact to light touch.     Gait is normal.     Medical Decision Making  Imaging: No recent thoracic imaging.   Assessment and Plan: Ms. 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   MRI in [place] in 2015 that showed T10 hemangioma. Also showed possible hemangioma in her liver. She has been followed for years since her malignant GI stromal tumor (GIST) of small intestine  with repeat scanning.   Now with constant mid to upper  LBP x 4-6 months. No radicular rib pain. Also with chronic neck and chronic LBP x years.   No recent thoracic imaging. MRI report scanned into chart from 11/22/14 showing T10 vertebral lesion abutting superior endplate, incompletely assessed, probable vertebral hemangioma.   Treatment options discussed with patient and following plan made:   - MRI of thoracic spine to evaluate thoracic pain and possible T10 hemangioma.  - She wants to focus on thoracic spine first. Will plan to revisit neck and lower back pain.  - Will schedule follow up visit to review MRI results once I get them back.   I spent a total of 35 minutes in face-to-face and non-face-to-face activities related to this patient's care today including review of outside records, review of imaging, review of symptoms, physical exam, discussion of differential diagnosis, discussion of treatment options, and documentation.   Thank you for involving me in the care of this patient.   Glade Boys PA-C Dept. of Neurosurgery

## 2023-09-12 ENCOUNTER — Encounter: Payer: Self-pay | Admitting: Orthopedic Surgery

## 2023-09-12 ENCOUNTER — Ambulatory Visit: Payer: BC Managed Care – PPO | Admitting: Orthopedic Surgery

## 2023-09-12 VITALS — BP 138/80 | Wt 131.4 lb

## 2023-09-12 DIAGNOSIS — M546 Pain in thoracic spine: Secondary | ICD-10-CM | POA: Diagnosis not present

## 2023-09-12 DIAGNOSIS — M545 Low back pain, unspecified: Secondary | ICD-10-CM | POA: Diagnosis not present

## 2023-09-12 DIAGNOSIS — Z8509 Personal history of malignant neoplasm of other digestive organs: Secondary | ICD-10-CM

## 2023-09-12 DIAGNOSIS — G8929 Other chronic pain: Secondary | ICD-10-CM | POA: Diagnosis not present

## 2023-09-12 DIAGNOSIS — M542 Cervicalgia: Secondary | ICD-10-CM

## 2023-09-12 NOTE — Patient Instructions (Signed)
 It was so nice to see you today. Thank you so much for coming in.    I want to get an MRI of your mid back to look into things further. We will get this approved through your insurance and [facility] will call you to schedule the appointment.   [facility] (building with the white pillars) is located off of [address]. The address is [address] [place], [place] [address].    After you have the MRI, it takes 14-21 days for me to get the results back. Once I have them, we will call you to schedule a follow up visit with me to review them.   Please do not hesitate to call if you have any questions or concerns. You can also message me in MyChart.   Glade Boys PA-C [phone]     The physicians and staff at [facility] Neurosurgery at [facility] are committed to providing excellent care. You may receive a survey asking for feedback about your experience at our office. We value you your feedback and appreciate you taking the time to to fill it out. The [facility] leadership team is also available to discuss your experience in person, feel free to contact us  [phone].

## 2023-09-14 NOTE — Addendum Note (Signed)
Addended by: Ernie Hew on: 09/14/2023 02:29 PM   Modules accepted: Orders

## 2023-09-29 ENCOUNTER — Ambulatory Visit
Admission: RE | Admit: 2023-09-29 | Discharge: 2023-09-29 | Disposition: A | Discharge status: Home / Self Care | Payer: BC Managed Care – PPO | Source: Ambulatory Visit | Attending: Orthopedic Surgery | Admitting: Orthopedic Surgery

## 2023-09-29 DIAGNOSIS — Z8509 Personal history of malignant neoplasm of other digestive organs: Secondary | ICD-10-CM

## 2023-09-29 DIAGNOSIS — M546 Pain in thoracic spine: Secondary | ICD-10-CM

## 2023-09-29 DIAGNOSIS — M47814 Spondylosis without myelopathy or radiculopathy, thoracic region: Secondary | ICD-10-CM | POA: Diagnosis not present

## 2023-09-29 DIAGNOSIS — G8929 Other chronic pain: Secondary | ICD-10-CM

## 2023-10-24 NOTE — Progress Notes (Signed)
 Referring Physician:  Sallyanne Kuster, NP [address] [place],  [place] [address]  Primary Physician:  Sallyanne Kuster, NP  History of Present Illness: 11/01/2023 Ms. 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   MRI in [place] in 2015 that showed T10 hemangioma. Also showed possible hemangioma in her liver. She has been followed for years since her malignant GI stromal tumor (GIST) of small intestine  with repeat scanning.   Last seen by me on 09/12/23 for constant mid to upper LBP x 4-6 months. No radicular rib pain. Also with chronic neck and chronic LBP x years.   MRI report scanned into chart from 11/22/14 showing T10 vertebral lesion abutting superior endplate, incompletely assessed, probable vertebral hemangioma.   She is here to review her thoracic MRI scan.   She has seen some improvement in mid back pain. Thinks mattress pad helped. The throbbing pain that made her jump is gone.   She continues with constant neck pain with radiation to both shoulders. No arm pain. No numbness, tingling, or weakness in her arms. Some relief with tylenol. Pain is worse with activity. She can't run anymore.   She continues with constant LBP with no leg pain. No numbness, tingling, or weakness in her legs. Pain is worse with prolonged standing, exercise, twisting, bending.   Some relief with rest and tylenol.   Bowel/Bladder Dysfunction: no urgency, no bowel issues, no perineal numbness. Notes some "dribbling" when she stands up after voiding x months. This is not all the time.   She does not smoke.   Conservative measures:  Physical therapy:  has not participated in Multimodal medical therapy including regular antiinflammatories:  tylenol Injections:  no epidural steroid injections  Past Surgery: no spinal surgeries  Francine Hannan has no symptoms of cervical  myelopathy.  The symptoms are causing a significant impact on the patient's life.   Review of Systems:  A 10 point review of systems is negative, except for the pertinent positives and negatives detailed in the HPI.  Past Medical History: Past Medical History:  Diagnosis Date   GIST (gastrointestinal stromal tumor), non-malignant    Hypothyroid    Tumor     Past Surgical History: Past Surgical History:  Procedure Laterality Date   AUGMENTATION MAMMAPLASTY Bilateral 2005   BREAST BIOPSY Right 10/18/2022   [place] Core Bx, Coil Clip - path pending   BREAST BIOPSY Right 10/18/2022   [place] RT BREAST BX W LOC DEV 1ST LESION IMG BX SPEC US GUIDE 10/18/2022 [facility]   gist tumor remooval     OVARIAN CYST REMOVAL Left    RESECTION SMALL BOWEL / CLOSURE ILEOSTOMY      Allergies: Allergies as of 11/01/2023 - Review Complete 11/01/2023  Allergen Reaction Noted   Gabapentin Anaphylaxis 05/01/2018   Amoxicillin-pot clavulanate Nausea And Vomiting 05/01/2018   Other Swelling 05/01/2018   Soy allergy (obsolete)  05/01/2018   Sulfamethoxazole-trimethoprim Other (See Comments) 05/01/2018    Medications: Outpatient Encounter Medications as of 11/01/2023  Medication Sig   acetaminophen (TYLENOL) 500 MG tablet Take 500 mg by mouth every 6 (six) hours as needed.   ALPRAZolam (XANAX) 0.5 MG tablet Take 1-2 tablets (0.5-1 mg total) by mouth at bedtime as needed for anxiety. Take 1-2 tablets as needed at bedtime.   amphetamine-dextroamphetamine (ADDERALL XR) 10 MG 24 hr capsule Take 1 capsule (10 mg total) by mouth daily.  fexofenadine-pseudoephedrine (ALLEGRA-D ALLERGY & CONGESTION) 60-120 MG 12 hr tablet Take 1 tablet by mouth twice daily.   levothyroxine (SYNTHROID) 125 MCG tablet Take 1 tablet (125 mcg total) by mouth daily before breakfast.   metFORMIN (GLUCOPHAGE-XR) 500 MG 24 hr tablet Take 1 tablet (500 mg total) by mouth daily with breakfast.   ondansetron (ZOFRAN) 4 MG tablet TAKE 1  TABLET(4 MG) BY MOUTH EVERY 8 HOURS AS NEEDED FOR NAUSEA OR VOMITING   Oxymetazoline HCl (UPNEEQ) 0.1 % SOLN Apply to eye.   valACYclovir (VALTREX) 1000 MG tablet Take 1 tablet (1,000 mg total) by mouth 2 (two) times daily. During outbreak/flare until resolved   [DISCONTINUED] amphetamine-dextroamphetamine (ADDERALL XR) 10 MG 24 hr capsule Take 1 capsule (10 mg total) by mouth daily. (Patient not taking: Reported on 09/12/2023)   [DISCONTINUED] amphetamine-dextroamphetamine (ADDERALL XR) 15 MG 24 hr capsule Take 1 capsule by mouth every morning.   [DISCONTINUED] amphetamine-dextroamphetamine (ADDERALL) 10 MG tablet Take 1 tablet (10 mg total) by mouth daily as needed (ADHD symptoms). (Patient not taking: Reported on 09/12/2023)   [DISCONTINUED] azelastine (ASTELIN) 0.1 % nasal spray Place 2 sprays into both nostrils 2 (two) times daily. Use in each nostril as directed (Patient not taking: Reported on 09/12/2023)   [DISCONTINUED] b complex vitamins capsule Take 1 capsule by mouth daily. (Patient not taking: Reported on 09/12/2023)   [DISCONTINUED] esomeprazole (NEXIUM) 40 MG capsule TAKE 1 CAPSULE TWICE A DAY BEFORE MEALS (Patient not taking: Reported on 11/01/2023)   [DISCONTINUED] fluocinonide cream (LIDEX) 0.05 % SMARTSIG:1 Topical Daily (Patient not taking: Reported on 09/12/2023)   [DISCONTINUED] imatinib (GLEEVEC) 100 MG tablet Take 100 mg by mouth 2 (two) times daily. Take with meals and large glass of water.Caution:Chemotherapy (Patient not taking: Reported on 09/12/2023)   No facility-administered encounter medications on file as of 11/01/2023.    Social History: Social History   Tobacco Use   Smoking status: Former   Smokeless tobacco: Never  Advertising account planner   Vaping status: Never Used  Substance Use Topics   Alcohol use: Yes    Comment: occasionally   Drug use: Never    Family Medical History: Family History  Problem Relation Age of Onset   Diabetes Mother    Coronary artery disease  Mother    Cirrhosis Father    Cancer Maternal Uncle    Rectal cancer Paternal Aunt    Cancer Paternal Aunt     Physical Examination: Vitals:   11/01/23 1345  BP: 120/76      Awake, alert, oriented to person, place, and time.  Speech is clear and fluent. Fund of knowledge is appropriate.   Cranial Nerves: Pupils equal round and reactive to light.  Facial tone is symmetric.    No posterior cervical tenderness. No tenderness in bilateral trapezial region.   No thoracic tenderness.   Diffuse lower posterior lumbar tenderness.   No abnormal lesions on exposed skin.   Strength: Side Biceps Triceps Deltoid Interossei Grip Wrist Ext. Wrist Flex.  R 5 5 5 5 5 5 5   L 5 5 5 5 5 5 5    Side Iliopsoas Quads Hamstring PF DF EHL  R 5 5 5 5 5 5   L 5 5 5 5 5 5    Reflexes are 2+ and symmetric at the biceps, brachioradialis, patella and achilles.   Hoffman's is absent.  Clonus is not present.   Bilateral upper and lower extremity sensation is intact to light touch.     Good ROM of  shoulders bilaterally with no pain.   No pain with IR/ER of both hips.   Gait is normal.     Medical Decision Making  Imaging: MRI thoracic spine dated 09/29/23:  FINDINGS: Alignment: Mild thoracic dextrocurvature. No listhesis within the sagittal plane.   Vertebrae: No acute fracture. No evidence of discitis. There are several scattered benign T1/T2 hyperintense intraosseous hemangiomas. No suspicious marrow replacing bone lesion.   Cord:  Normal signal and morphology.   Paraspinal and other soft tissues: Negative.   Disc levels:   Intervertebral disc heights of the thoracic spine are preserved. No focal disc protrusion. Mild degenerative facet arthropathy, more pronounced within the lower thoracic spine. There is no foraminal or canal stenosis at any level.   IMPRESSION: Mild degenerative facet arthropathy of the thoracic spine. No foraminal or canal stenosis at any level.      Electronically Signed   By: Duanne Guess D.O.   On: 10/14/2023 21:53   I have personally reviewed the images and agree with the above interpretation.   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   She's seen improvement in mid back pain. Thinks mattress pad helped. The throbbing pain that made her jump is gone.   Above thoracic MRI shows several scattered benign T1/T2 hyperintense intraosseous hemangiomas along with mild thoracic spondylosis.   She continues with constant neck pain with radiation to both shoulders. No arm pain. No numbness, tingling, or weakness in her arms.   Cervical xray report from [facility] dated 03/19/19 reads multilevel cervical spondylosis and DDD. Anterolisthesis C4-C5 and retrolisthesis C5-C6. Images not available.   She continues with constant LBP with no leg pain. No numbness, tingling, or weakness in her legs.   No lumbar imaging available.   Treatment options discussed with patient and following plan made:   - No further treatment for thoracic spine as pain as improved.  - MRI of cervical and lumbar spine to evaluate pain. No improvement with time or medications.  - Cervical xrays with flex/ext- will get when she has MRIs done.  - Will schedule follow up visit to review MRI and xray results once I get them back.   I spent a total of 30 minutes in face-to-face and non-face-to-face activities related to this patient's care today including review of outside records, review of imaging, review of symptoms, physical exam, discussion of differential diagnosis, discussion of treatment options, and documentation.   Drake Leach PA-C Dept. of Neurosurgery

## 2023-10-31 ENCOUNTER — Ambulatory Visit: Payer: BC Managed Care – PPO | Admitting: Orthopedic Surgery

## 2023-11-01 ENCOUNTER — Ambulatory Visit: Payer: BC Managed Care – PPO | Admitting: Orthopedic Surgery

## 2023-11-01 ENCOUNTER — Encounter: Payer: Self-pay | Admitting: Orthopedic Surgery

## 2023-11-01 VITALS — BP 120/76 | Ht 63.0 in | Wt 131.0 lb

## 2023-11-01 DIAGNOSIS — M47812 Spondylosis without myelopathy or radiculopathy, cervical region: Secondary | ICD-10-CM

## 2023-11-01 DIAGNOSIS — M542 Cervicalgia: Secondary | ICD-10-CM

## 2023-11-01 DIAGNOSIS — M5412 Radiculopathy, cervical region: Secondary | ICD-10-CM

## 2023-11-01 DIAGNOSIS — M47814 Spondylosis without myelopathy or radiculopathy, thoracic region: Secondary | ICD-10-CM | POA: Diagnosis not present

## 2023-11-01 DIAGNOSIS — M4722 Other spondylosis with radiculopathy, cervical region: Secondary | ICD-10-CM

## 2023-11-01 DIAGNOSIS — M545 Low back pain, unspecified: Secondary | ICD-10-CM | POA: Diagnosis not present

## 2023-11-01 DIAGNOSIS — G8929 Other chronic pain: Secondary | ICD-10-CM

## 2023-11-01 DIAGNOSIS — M4312 Spondylolisthesis, cervical region: Secondary | ICD-10-CM | POA: Diagnosis not present

## 2023-11-01 NOTE — Patient Instructions (Signed)
 It was so nice to see you today. Thank you so much for coming in.    Overall, your thoracic MRI scan looked good.   I want to get an MRI of your neck and lower back to look into things further. We will get this approved through your insurance and [facility] will call you to schedule the appointment.   Make sure you get your neck xrays done when you go for the MRI scans.   [facility] (building with the white [facility]) is located off of [address]. The address is [address], [place], [place] [address].    After you have the MRIs and xrays, it takes 14-21 days for me to get the results back. Once I have them, we will call you to schedule a follow up visit with me to review them.   Please do not hesitate to call if you have any questions or concerns. You can also message me in MyChart.   Drake Leach PA-C [phone]     The physicians and staff at [facility] Neurosurgery at [facility] are committed to providing excellent care. You may receive a survey asking for feedback about your experience at our office. We value you your feedback and appreciate you taking the time to to fill it out. The [facility] leadership team is also available to discuss your experience in person, feel free to contact us [phone].

## 2023-11-13 ENCOUNTER — Ambulatory Visit: Payer: BC Managed Care – PPO | Admitting: Nurse Practitioner

## 2023-11-16 ENCOUNTER — Telehealth: Payer: Self-pay | Admitting: Nurse Practitioner

## 2023-11-16 ENCOUNTER — Ambulatory Visit: Admitting: Nurse Practitioner

## 2023-11-16 ENCOUNTER — Encounter: Payer: Self-pay | Admitting: Nurse Practitioner

## 2023-11-16 VITALS — BP 122/82 | HR 78 | Temp 98.3°F | Resp 16 | Ht 63.0 in | Wt 132.6 lb

## 2023-11-16 DIAGNOSIS — R7303 Prediabetes: Secondary | ICD-10-CM | POA: Diagnosis not present

## 2023-11-16 DIAGNOSIS — F988 Other specified behavioral and emotional disorders with onset usually occurring in childhood and adolescence: Secondary | ICD-10-CM

## 2023-11-16 DIAGNOSIS — Z79899 Other long term (current) drug therapy: Secondary | ICD-10-CM

## 2023-11-16 DIAGNOSIS — E782 Mixed hyperlipidemia: Secondary | ICD-10-CM | POA: Diagnosis not present

## 2023-11-16 DIAGNOSIS — F411 Generalized anxiety disorder: Secondary | ICD-10-CM | POA: Diagnosis not present

## 2023-11-16 DIAGNOSIS — E039 Hypothyroidism, unspecified: Secondary | ICD-10-CM

## 2023-11-16 LAB — POCT URINE DRUG SCREEN
Methylenedioxyamphetamine: NOT DETECTED
POC Amphetamine UR: POSITIVE — AB
POC BENZODIAZEPINES UR: POSITIVE — AB
POC Barbiturate UR: NOT DETECTED
POC Cocaine UR: NOT DETECTED
POC Ecstasy UR: NOT DETECTED
POC Marijuana UR: NOT DETECTED
POC Methadone UR: NOT DETECTED
POC Methamphetamine UR: NOT DETECTED
POC Opiate Ur: NOT DETECTED
POC Oxycodone UR: NOT DETECTED
POC PHENCYCLIDINE UR: NOT DETECTED
POC TRICYCLICS UR: NOT DETECTED

## 2023-11-16 MED ORDER — ALPRAZOLAM 0.5 MG PO TABS: 0.5000 mg | ORAL_TABLET | Freq: Every evening | ORAL | 2 refills | Status: DC | PRN

## 2023-11-16 MED ORDER — METFORMIN HCL ER 500 MG PO TB24: 500.0000 mg | ORAL_TABLET | Freq: Every day | ORAL | 1 refills | Status: DC

## 2023-11-16 MED ORDER — AMPHETAMINE-DEXTROAMPHET ER 10 MG PO CP24: 10.0000 mg | ORAL_CAPSULE | Freq: Every day | ORAL | 0 refills | Status: DC

## 2023-11-16 MED ORDER — LEVOTHYROXINE SODIUM 125 MCG PO TABS: 125.0000 ug | ORAL_TABLET | Freq: Every day | ORAL | 3 refills | Status: DC

## 2023-11-16 MED ORDER — AMPHETAMINE-DEXTROAMPHETAMINE 10 MG PO TABS: 10.0000 mg | ORAL_TABLET | Freq: Every day | ORAL | 0 refills | Status: DC | PRN

## 2023-11-16 NOTE — Telephone Encounter (Signed)
Lvm to schedule today's follow up-Toni

## 2023-11-16 NOTE — Progress Notes (Signed)
 [facility] [address] [place], [place] [address]  Internal MEDICINE  Office Visit Note  Patient Name: Yesenia Ward  [id number]  [id number]  Date of Service: 11/16/2023  Chief Complaint  Patient presents with   Follow-up    HPI Yesenia Ward presents for a follow-up visit for hypothyroidism, ADHD, anxiety, prediabetes and high cholesterol. Hypothyroidism -- out of medication since last week.  ADHD -- current dose is effective. Heart rate and blood pressure are normal. Denies any palpitations or other adverse side effects of the medication. Due for refills. Due for UDS today. Anxiety -- taking prn alprazolam   Prediabetes and high cholesterol -- A1c is 5.7. also triglycerides are significantly elevated at 400. LDL is 211 and VLDL is 84.   The ASCVD Risk score (Arnett DK, et al., 2019) failed to calculate for the following reasons:   The valid total cholesterol range is 130 to 320 mg/dL total cholesterol is 188    Current Medication: Outpatient Encounter Medications as of 11/16/2023  Medication Sig   acetaminophen (TYLENOL) 500 MG tablet Take 500 mg by mouth every 6 (six) hours as needed.   ALPRAZolam  (XANAX ) 0.5 MG tablet Take 1-2 tablets (0.5-1 mg total) by mouth at bedtime as needed for anxiety. Take 1-2 tablets as needed at bedtime.   amphetamine -dextroamphetamine  (ADDERALL XR) 10 MG 24 hr capsule Take 1 capsule (10 mg total) by mouth daily.   amphetamine -dextroamphetamine  (ADDERALL XR) 10 MG 24 hr capsule Take 1 capsule (10 mg total) by mouth daily.   [START ON 01/11/2024] amphetamine -dextroamphetamine  (ADDERALL XR) 10 MG 24 hr capsule Take 1 capsule (10 mg total) by mouth daily.   amphetamine -dextroamphetamine  (ADDERALL) 10 MG tablet Take 1 tablet (10 mg total) by mouth daily as needed (ADHD symptoms).   fexofenadine -pseudoephedrine (ALLEGRA-D ALLERGY & CONGESTION) 60-120 MG 12 hr tablet Take 1 tablet by mouth twice daily.   levothyroxine  (SYNTHROID ) 125 MCG tablet Take 1  tablet (125 mcg total) by mouth daily before breakfast.   metFORMIN  (GLUCOPHAGE -XR) 500 MG 24 hr tablet Take 1 tablet (500 mg total) by mouth daily with breakfast.   ondansetron  (ZOFRAN ) 4 MG tablet TAKE 1 TABLET(4 MG) BY MOUTH EVERY 8 HOURS AS NEEDED FOR NAUSEA OR VOMITING   Oxymetazoline HCl (UPNEEQ) 0.1 % SOLN Apply to eye.   valACYclovir  (VALTREX ) 1000 MG tablet Take 1 tablet (1,000 mg total) by mouth 2 (two) times daily. During outbreak/flare until resolved   [DISCONTINUED] ALPRAZolam  (XANAX ) 0.5 MG tablet Take 1-2 tablets (0.5-1 mg total) by mouth at bedtime as needed for anxiety. Take 1-2 tablets as needed at bedtime.   [DISCONTINUED] amphetamine -dextroamphetamine  (ADDERALL XR) 10 MG 24 hr capsule Take 1 capsule (10 mg total) by mouth daily.   [DISCONTINUED] levothyroxine  (SYNTHROID ) 125 MCG tablet Take 1 tablet (125 mcg total) by mouth daily before breakfast.   [DISCONTINUED] metFORMIN  (GLUCOPHAGE -XR) 500 MG 24 hr tablet Take 1 tablet (500 mg total) by mouth daily with breakfast.   No facility-administered encounter medications on file as of 11/16/2023.    Surgical History: Past Surgical History:  Procedure Laterality Date   AUGMENTATION MAMMAPLASTY Bilateral 2005   BREAST BIOPSY Right 10/18/2022   Us  Core Bx, Coil Clip - path pending   BREAST BIOPSY Right 10/18/2022   US  RT BREAST BX W LOC DEV 1ST LESION IMG BX SPEC US  GUIDE 10/18/2022 [facility]   gist tumor remooval     OVARIAN CYST REMOVAL Left    RESECTION SMALL BOWEL / CLOSURE ILEOSTOMY  Medical History: Past Medical History:  Diagnosis Date   GIST (gastrointestinal stromal tumor), non-malignant    Hypothyroid    Tumor     Family History: Family History  Problem Relation Age of Onset   Diabetes Mother    Coronary artery disease Mother    Cirrhosis Father    Cancer Maternal Uncle    Rectal cancer Paternal Aunt    Cancer Paternal Aunt     Social History   Socioeconomic History   Marital status:  Married    Spouse name: Not on file   Number of children: Not on file   Years of education: Not on file   Highest education level: Not on file  Occupational History   Not on file  Tobacco Use   Smoking status: Former   Smokeless tobacco: Never  Vaping Use   Vaping status: Never Used  Substance and Sexual Activity   Alcohol use: Yes    Comment: occasionally   Drug use: Never   Sexual activity: Not on file  Other Topics Concern   Not on file  Social History Narrative   Not on file   Social Drivers of Health   Financial Resource Strain: Not on file  Food Insecurity: Not on file  Transportation Needs: Not on file  Physical Activity: Not on file  Stress: Not on file  Social Connections: Not on file  Intimate Partner Violence: Not on file      Review of Systems  Constitutional:  Negative for chills, fatigue and unexpected weight change.  HENT:  Negative for congestion, rhinorrhea, sneezing and sore throat.   Eyes:  Negative for redness.  Respiratory:  Negative for cough, chest tightness and shortness of breath.   Cardiovascular:  Negative for chest pain and palpitations.  Gastrointestinal:  Negative for abdominal pain, constipation, diarrhea, nausea and vomiting.  Genitourinary:  Negative for dysuria and frequency.  Musculoskeletal:  Negative for arthralgias, back pain, joint swelling and neck pain.  Skin:  Negative for rash.  Neurological: Negative.  Negative for tremors and numbness.  Hematological:  Negative for adenopathy. Does not bruise/bleed easily.  Psychiatric/Behavioral:  Negative for behavioral problems (Depression), sleep disturbance and suicidal ideas. The patient is not nervous/anxious.     Vital Signs: BP 122/82   Pulse 78   Temp 98.3 F (36.8 C)   Resp 16   Ht 5\' 3"  (1.6 m)   Wt 132 lb 9.6 oz (60.1 kg)   SpO2 97%   BMI 23.49 kg/m    Physical Exam Vitals reviewed.  Constitutional:      General: She is not in acute distress.    Appearance:  Normal appearance. She is normal weight. She is not ill-appearing.  HENT:     Head: Normocephalic and atraumatic.  Eyes:     Pupils: Pupils are equal, round, and reactive to light.  Cardiovascular:     Rate and Rhythm: Normal rate and regular rhythm.  Pulmonary:     Effort: Pulmonary effort is normal. No respiratory distress.  Neurological:     Mental Status: She is alert and oriented to person, place, and time.     Cranial Nerves: No cranial nerve deficit.     Coordination: Coordination normal.     Gait: Gait normal.  Psychiatric:        Mood and Affect: Mood normal.        Behavior: Behavior normal.        Assessment/Plan: 1. Prediabetes (Primary) Start metformin  as prescribed.  -  metFORMIN  (GLUCOPHAGE -XR) 500 MG 24 hr tablet; Take 1 tablet (500 mg total) by mouth daily with breakfast.  Dispense: 90 tablet; Refill: 1  2. Mixed hyperlipidemia Declined medication for now. Wants to see how she does with metformin  first. Emphasized to the patient that it is very important to bring her triglyceride and LDL levels  3. Acquired hypothyroidism Continue levothyroxine  as prescribed.  - levothyroxine  (SYNTHROID ) 125 MCG tablet; Take 1 tablet (125 mcg total) by mouth daily before breakfast.  Dispense: 90 tablet; Refill: 3  4. Encounter for long-term (current) use of medications UDS done in office today, positive for amphetamine  and benzodiazepines which is consistent with her current prescriptions.  - POCT Urine Drug Screen  5. Generalized anxiety disorder Continue prn alprazolam  as prescribed. Follow up in 3 months for additional refills.  - ALPRAZolam  (XANAX ) 0.5 MG tablet; Take 1-2 tablets (0.5-1 mg total) by mouth at bedtime as needed for anxiety. Take 1-2 tablets as needed at bedtime.  Dispense: 60 tablet; Refill: 2  6. Adult attention deficit disorder Continue adderall as prescribed. Follow up in 3 months for additional refills. UDS due again in 6 months  -  amphetamine -dextroamphetamine  (ADDERALL XR) 10 MG 24 hr capsule; Take 1 capsule (10 mg total) by mouth daily.  Dispense: 30 capsule; Refill: 0 - amphetamine -dextroamphetamine  (ADDERALL XR) 10 MG 24 hr capsule; Take 1 capsule (10 mg total) by mouth daily.  Dispense: 30 capsule; Refill: 0 - amphetamine -dextroamphetamine  (ADDERALL XR) 10 MG 24 hr capsule; Take 1 capsule (10 mg total) by mouth daily.  Dispense: 30 capsule; Refill: 0 - amphetamine -dextroamphetamine  (ADDERALL) 10 MG tablet; Take 1 tablet (10 mg total) by mouth daily as needed (ADHD symptoms).  Dispense: 90 tablet; Refill: 0   General Counseling: Jerlean verbalizes understanding of the findings of todays visit and agrees with plan of treatment. I have discussed any further diagnostic evaluation that may be needed or ordered today. We also reviewed her medications today. she has been encouraged to call the office with any questions or concerns that should arise related to todays visit.    Orders Placed This Encounter  Procedures   Lipid panel   Hemoglobin A1c   POCT Urine Drug Screen    Meds ordered this encounter  Medications   levothyroxine  (SYNTHROID ) 125 MCG tablet    Sig: Take 1 tablet (125 mcg total) by mouth daily before breakfast.    Dispense:  90 tablet    Refill:  3   ALPRAZolam  (XANAX ) 0.5 MG tablet    Sig: Take 1-2 tablets (0.5-1 mg total) by mouth at bedtime as needed for anxiety. Take 1-2 tablets as needed at bedtime.    Dispense:  60 tablet    Refill:  2   amphetamine -dextroamphetamine  (ADDERALL XR) 10 MG 24 hr capsule    Sig: Take 1 capsule (10 mg total) by mouth daily.    Dispense:  30 capsule    Refill:  0    Fill for march   amphetamine -dextroamphetamine  (ADDERALL XR) 10 MG 24 hr capsule    Sig: Take 1 capsule (10 mg total) by mouth daily.    Dispense:  30 capsule    Refill:  0    Fill for April   amphetamine -dextroamphetamine  (ADDERALL XR) 10 MG 24 hr capsule    Sig: Take 1 capsule (10 mg total) by  mouth daily.    Dispense:  30 capsule    Refill:  0    Fill for may   metFORMIN  (GLUCOPHAGE -XR) 500 MG  24 hr tablet    Sig: Take 1 tablet (500 mg total) by mouth daily with breakfast.    Dispense:  90 tablet    Refill:  1    Prediabetes and high cholesterol   amphetamine -dextroamphetamine  (ADDERALL) 10 MG tablet    Sig: Take 1 tablet (10 mg total) by mouth daily as needed (ADHD symptoms).    Dispense:  90 tablet    Refill:  0    3 month supply of extra additional dose to be taken after taking her 20 mg XR adderall dose,    Return in about 12 weeks (around 02/08/2024) for F/U, anxiety med refill, ADHD med check, Gurnie Duris PCP.   Total time spent:30 Minutes Time spent includes review of chart, medications, test results, and follow up plan with the patient.   [place] Controlled Substance Database was reviewed by me.  This patient was seen by Laurence Pons, FNP-C in collaboration with Dr. Verneta Gone as a part of collaborative care agreement.   Walida Cajas R. Bobbi Burow, MSN, FNP-C Internal medicine

## 2023-12-02 IMAGING — CT CT ORBITS W/O CM
3 of 6 series · 11 of 47 positions shown, 13 images · non-contrast
Comparison: None Available.

CLINICAL DATA: Thyroid-related proptosis. Additional history
provided: Patient reports right eye issues for almost 20 years (dry
eye, double vision).

EXAM:
CT ORBITS WITHOUT CONTRAST
TECHNIQUE: Multidetector CT imaging of the orbits was performed using the
standard protocol without intravenous contrast. Multiplanar CT image
reconstructions were also generated.
RADIATION DOSE REDUCTION: This exam was performed according to the
departmental dose-optimization program which includes automated
exposure control, adjustment of the mA and/or kV according to
patient size and/or use of iterative reconstruction technique.

[Series 2: orbits 2.0 hr60 bone · axial · 0.29mm/px · z∈[-85,-37]mm · 6 of 34 slices shown, 8 images]
[im 5/34  brain]
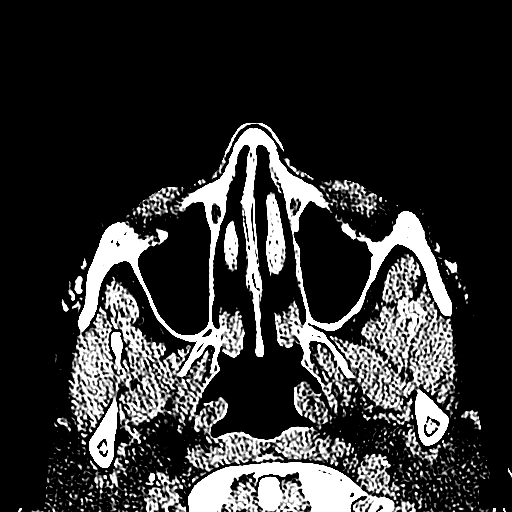
[im 5/34  bone]
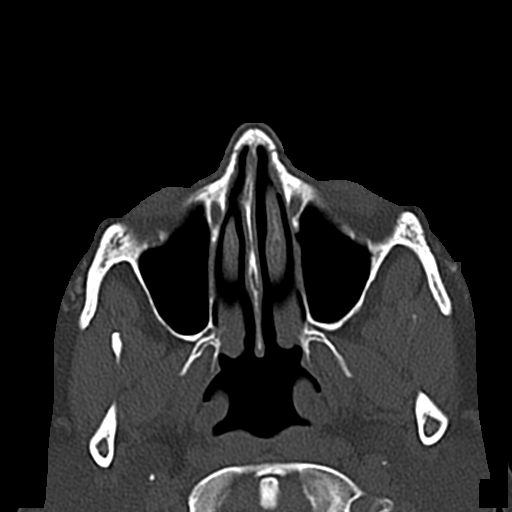
[im 10/34  bone]
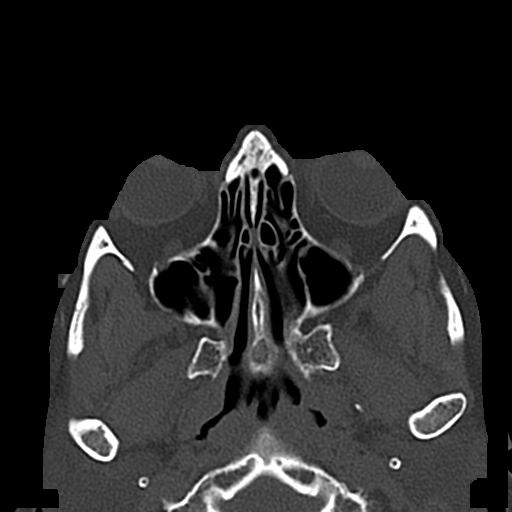
[im 15/34  bone]
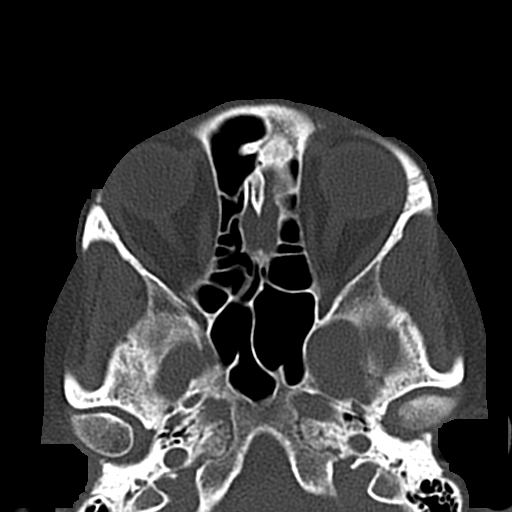
[im 19/34  bone]
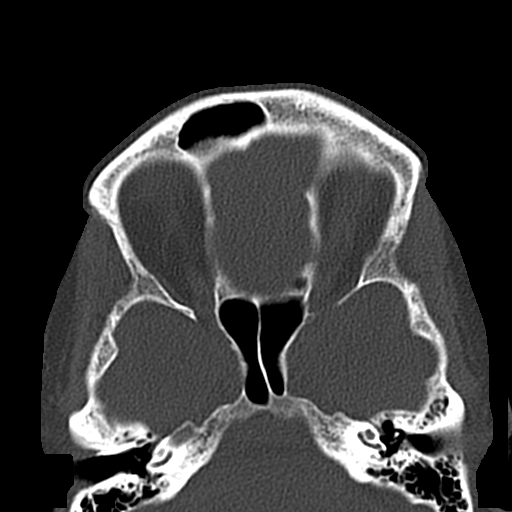
[im 24/34  brain]
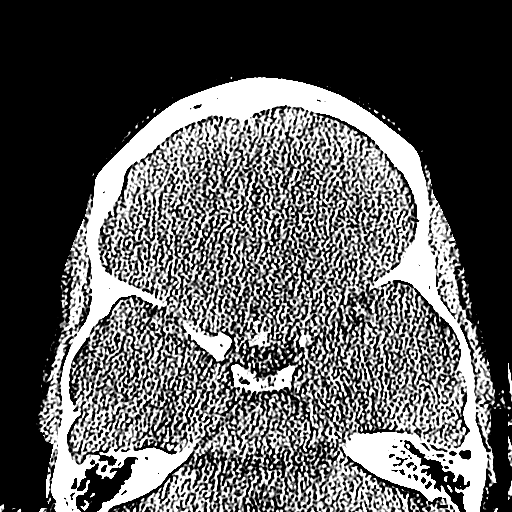
[im 24/34  bone]
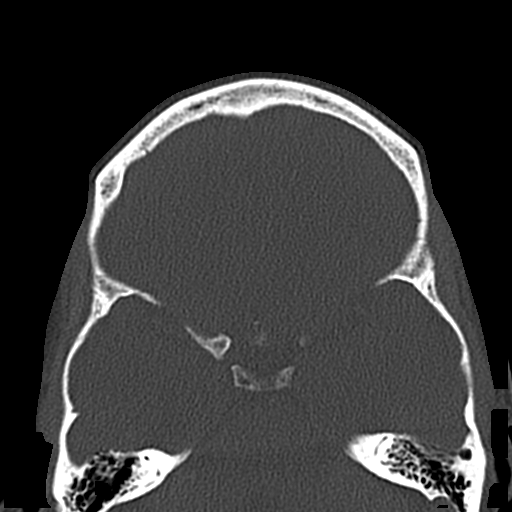
[im 29/34  bone]
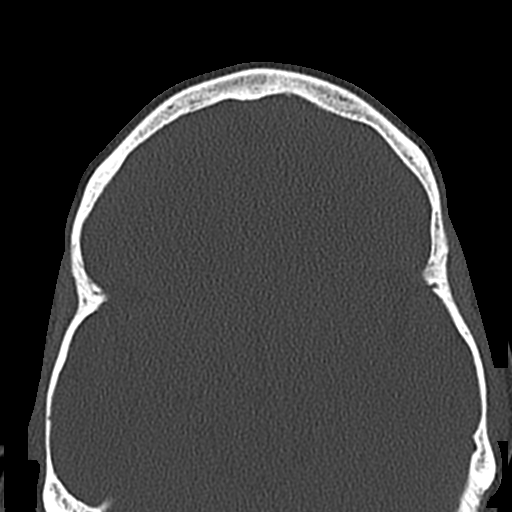

[Series 4: orbits 2.0 coronal · coronal · 0.15mm/px · 3 of 96 slices shown]
[im 24/96  bone]
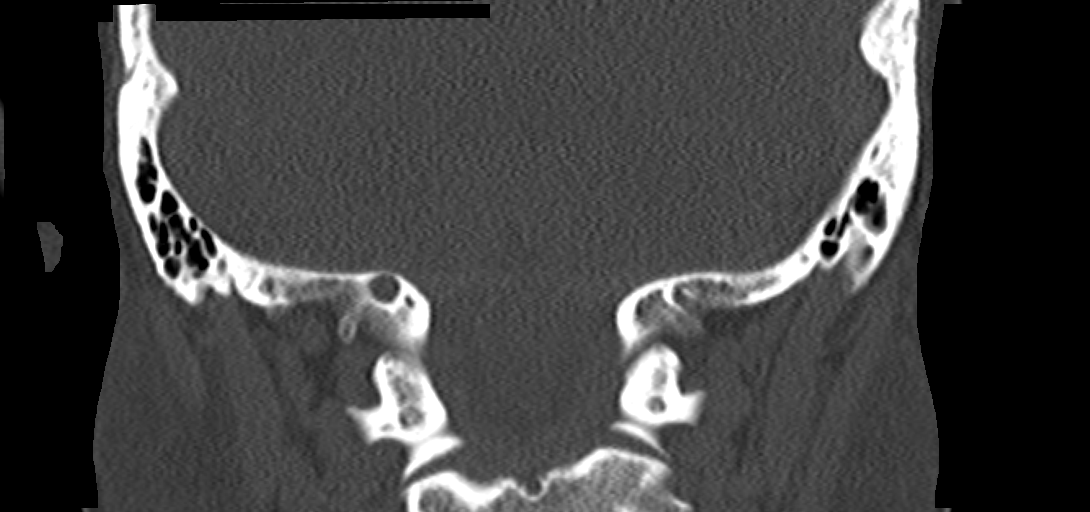
[im 48/96  bone]
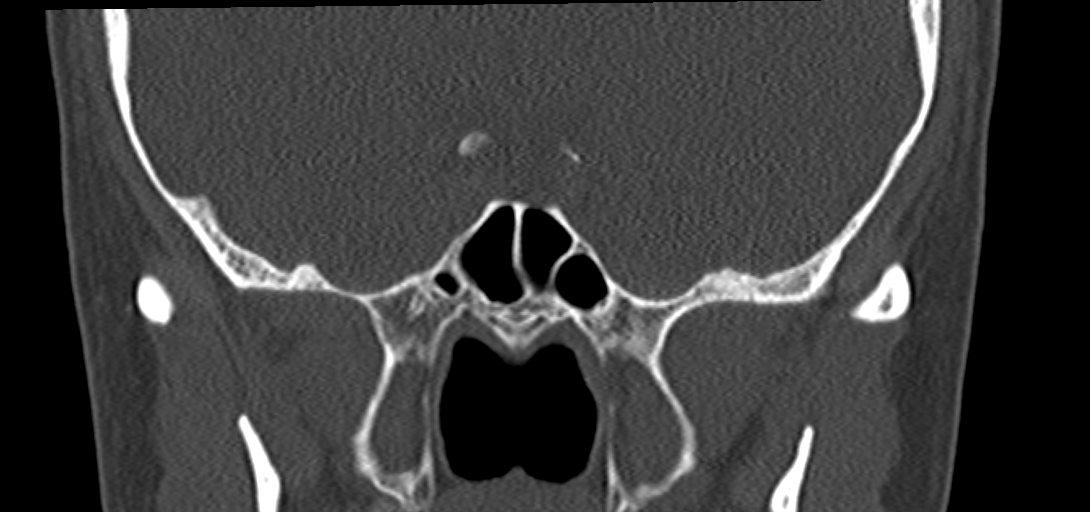
[im 72/96  bone]
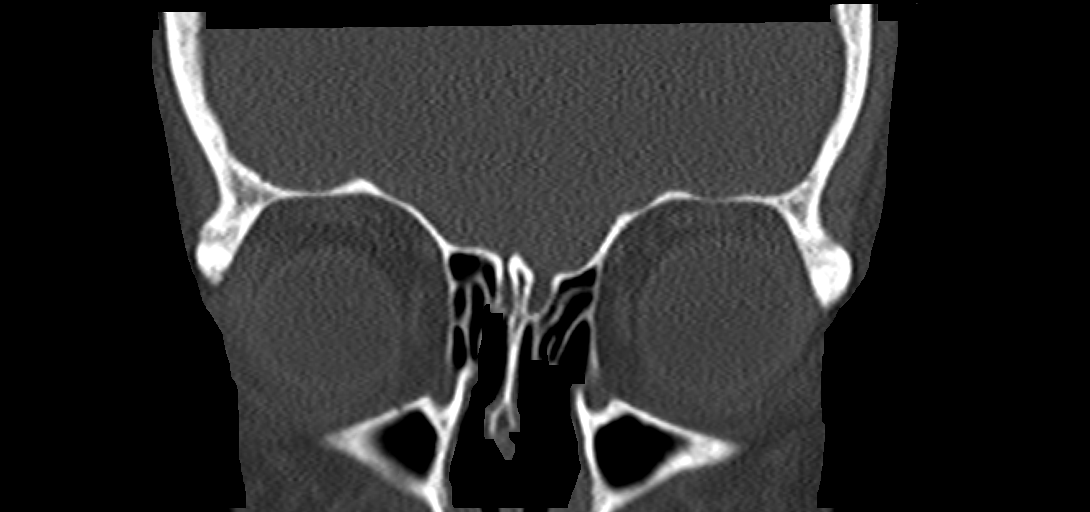

[Series 9: orbits 2.0 sagittal · sagittal · 0.15mm/px · 2 of 83 slices shown]
[im 28/83  bone]
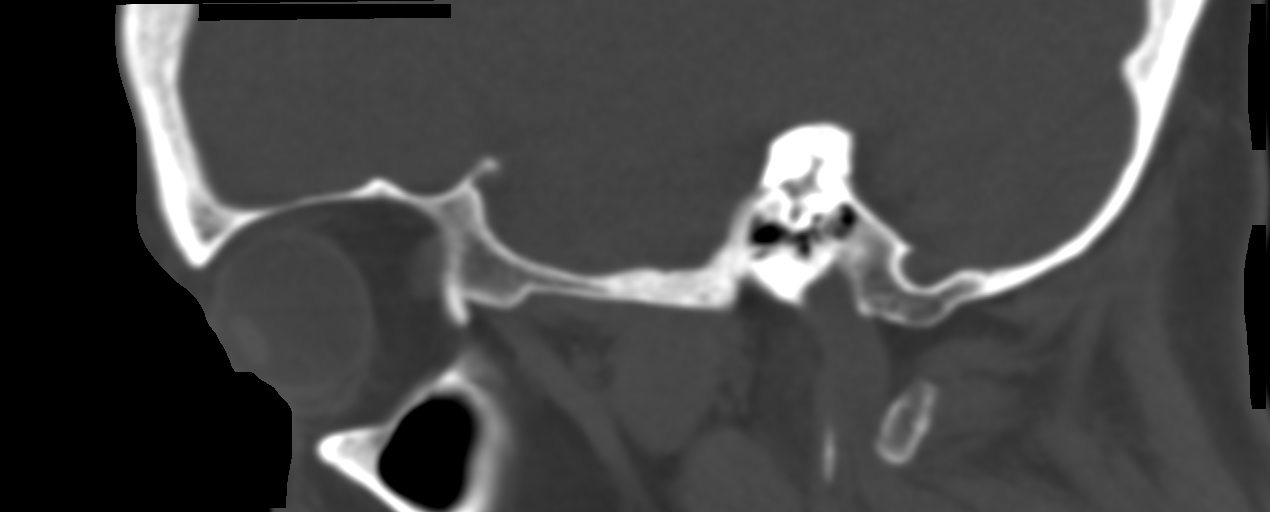
[im 55/83  bone]
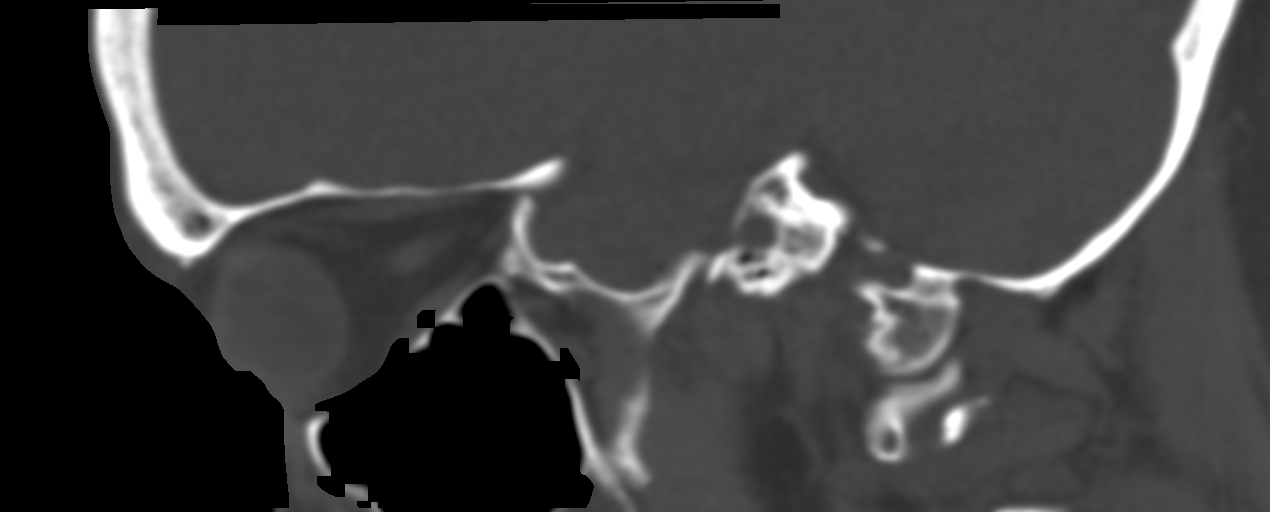

[11 of 47 positions shown; findings below may reference images not displayed]

FINDINGS: Orbits: The globes are normal in size and contour. The extraocular
muscles, optic nerve sheath complexes and lacrimal glands are
symmetric and unremarkable. No appreciable intraorbital mass.

Visible paranasal sinuses: Trace mucosal thickening within the
posterior ethmoid air cells, bilaterally.

Soft tissues: The visualized maxillofacial and upper neck soft
tissues are unremarkable.

Osseous: No acute fracture or aggressive osseous lesion.

Limited intracranial: No evidence of acute intracranial abnormality
within the field of view.
IMPRESSION: Unremarkable non-contrast CT appearance of the orbits.

## 2023-12-13 ENCOUNTER — Other Ambulatory Visit: Payer: Self-pay | Admitting: Orthopedic Surgery

## 2023-12-13 ENCOUNTER — Ambulatory Visit
Admission: RE | Admit: 2023-12-13 | Discharge: 2023-12-13 | Disposition: A | Discharge status: Home / Self Care | Source: Ambulatory Visit | Attending: Orthopedic Surgery | Admitting: Orthopedic Surgery

## 2023-12-13 DIAGNOSIS — M5412 Radiculopathy, cervical region: Secondary | ICD-10-CM

## 2023-12-13 DIAGNOSIS — M4802 Spinal stenosis, cervical region: Secondary | ICD-10-CM | POA: Diagnosis not present

## 2023-12-13 DIAGNOSIS — M47814 Spondylosis without myelopathy or radiculopathy, thoracic region: Secondary | ICD-10-CM

## 2023-12-13 DIAGNOSIS — M4312 Spondylolisthesis, cervical region: Secondary | ICD-10-CM

## 2023-12-13 DIAGNOSIS — M4722 Other spondylosis with radiculopathy, cervical region: Secondary | ICD-10-CM | POA: Diagnosis not present

## 2023-12-13 DIAGNOSIS — M545 Low back pain, unspecified: Secondary | ICD-10-CM

## 2023-12-13 DIAGNOSIS — M542 Cervicalgia: Secondary | ICD-10-CM

## 2023-12-13 DIAGNOSIS — M47812 Spondylosis without myelopathy or radiculopathy, cervical region: Secondary | ICD-10-CM

## 2023-12-13 DIAGNOSIS — M48061 Spinal stenosis, lumbar region without neurogenic claudication: Secondary | ICD-10-CM | POA: Diagnosis not present

## 2023-12-25 ENCOUNTER — Encounter: Payer: Self-pay | Admitting: Nurse Practitioner

## 2023-12-25 LAB — HEMOGLOBIN A1C: Hemoglobin A1C: 5.7

## 2023-12-25 LAB — LIPID PANEL
Cholesterol, Total: 359
HDL Cholesterol: 64 (ref 35–?)
LDL Cholesterol: 211
Triglycerides: 400 — AB (ref 40–160)
VLDL: 84 mg/dL

## 2024-01-02 ENCOUNTER — Other Ambulatory Visit: Payer: Self-pay | Admitting: Nurse Practitioner

## 2024-01-02 DIAGNOSIS — F988 Other specified behavioral and emotional disorders with onset usually occurring in childhood and adolescence: Secondary | ICD-10-CM

## 2024-01-02 NOTE — Telephone Encounter (Signed)
 Please refused she already have pres

## 2024-01-03 NOTE — Progress Notes (Unsigned)
 Referring Physician:  No referring provider defined for this encounter.  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   Last seen by me on 11/01/23 for neck and LBP. Cervical xray report from [facility] dated 03/19/19 reads multilevel cervical spondylosis and DDD. Anterolisthesis C4-C5 and retrolisthesis C5-C6.   MRI of cervical and lumbar spine ordered along with cervical xrays. She is here to review them.   She continues with constant neck pain with radiation to both shoulders. No arm pain. No weakness in her arms. Wakes up at night intermittently with numbness and tingling.  She continues with constant LBP with no leg pain. No numbness, tingling, or weakness in her legs. Pain is worse with prolonged standing, exercise, twisting, bending.   Her thoracic pain is gone.   Some relief with rest and tylenol.   Bowel/Bladder Dysfunction: no urgency, no bowel issues, no perineal numbness. Notes some "dribbling" when she stands up after voiding x months. This is not all the time.   She does not smoke.   Conservative measures:  Physical therapy:  has not participated in Multimodal medical therapy including regular antiinflammatories:  tylenol Injections:  no epidural steroid injections  Past Surgery: no spinal surgeries  Yesenia Ward has no symptoms of cervical myelopathy.  The symptoms are causing a significant impact on the patient's life.   Review of Systems:  A 10 point review of systems is negative, except for the pertinent positives and negatives detailed in the HPI.  Past Medical History: Past Medical History:  Diagnosis Date   GIST (gastrointestinal stromal tumor), non-malignant    Hypothyroid    Tumor     Past Surgical History: Past Surgical History:  Procedure Laterality  Date   AUGMENTATION MAMMAPLASTY Bilateral 2005   BREAST BIOPSY Right 10/18/2022   Us  Core Bx, Coil Clip - path pending   BREAST BIOPSY Right 10/18/2022   US  RT BREAST BX W LOC DEV 1ST LESION IMG BX SPEC US  GUIDE 10/18/2022 [facility]   gist tumor remooval     OVARIAN CYST REMOVAL Left    RESECTION SMALL BOWEL / CLOSURE ILEOSTOMY      Allergies: Allergies as of 01/04/2024 - Review Complete 01/04/2024  Allergen Reaction Noted   Gabapentin Anaphylaxis 05/01/2018   Amoxicillin-pot clavulanate Nausea And Vomiting 05/01/2018   Other Swelling 05/01/2018   Soy allergy (obsolete)  05/01/2018   Sulfamethoxazole-trimethoprim Other (See Comments) 05/01/2018    Medications: Outpatient Encounter Medications as of 01/04/2024  Medication Sig   acetaminophen (TYLENOL) 500 MG tablet Take 500 mg by mouth every 6 (six) hours as needed.   ALPRAZolam  (XANAX ) 0.5 MG tablet Take 1-2 tablets (0.5-1 mg total) by mouth at bedtime as needed for anxiety. Take 1-2 tablets as needed at bedtime.   amphetamine -dextroamphetamine  (ADDERALL XR) 10 MG 24 hr capsule Take 1 capsule (10 mg total) by mouth daily.   amphetamine -dextroamphetamine  (ADDERALL XR) 10 MG 24 hr capsule Take 1 capsule (10 mg total) by mouth daily.   [START ON 01/11/2024] amphetamine -dextroamphetamine  (ADDERALL XR) 10 MG 24 hr capsule Take 1 capsule (10 mg total) by mouth daily.   amphetamine -dextroamphetamine  (ADDERALL) 10 MG tablet Take 1 tablet (10 mg total) by mouth daily as needed (ADHD symptoms).   fexofenadine -pseudoephedrine (ALLEGRA-D ALLERGY & CONGESTION) 60-120 MG 12 hr tablet Take 1 tablet by mouth twice daily.  levothyroxine  (SYNTHROID ) 125 MCG tablet Take 1 tablet (125 mcg total) by mouth daily before breakfast.   metFORMIN  (GLUCOPHAGE -XR) 500 MG 24 hr tablet Take 1 tablet (500 mg total) by mouth daily with breakfast.   ondansetron  (ZOFRAN ) 4 MG tablet TAKE 1 TABLET(4 MG) BY MOUTH EVERY 8 HOURS AS NEEDED FOR NAUSEA OR VOMITING    Oxymetazoline HCl (UPNEEQ) 0.1 % SOLN Apply to eye.   valACYclovir  (VALTREX ) 1000 MG tablet Take 1 tablet (1,000 mg total) by mouth 2 (two) times daily. During outbreak/flare until resolved   No facility-administered encounter medications on file as of 01/04/2024.    Social History: Social History   Tobacco Use   Smoking status: Former   Smokeless tobacco: Never  Advertising account planner   Vaping status: Never Used  Substance Use Topics   Alcohol use: Yes    Comment: occasionally   Drug use: Never    Family Medical History: Family History  Problem Relation Age of Onset   Diabetes Mother    Coronary artery disease Mother    Cirrhosis Father    Cancer Maternal Uncle    Rectal cancer Paternal Aunt    Cancer Paternal Aunt     Physical Examination: Vitals:   01/04/24 1523  BP: 134/82    Awake, alert, oriented to person, place, and time.  Speech is clear and fluent. Fund of knowledge is appropriate.   Cranial Nerves: Pupils equal round and reactive to light.  Facial tone is symmetric.    No abnormal lesions on exposed skin.   Strength: Side Biceps Triceps Deltoid Interossei Grip Wrist Ext. Wrist Flex.  R 5 5 5 5 5 5 5   L 5 5 5 5 5 5 5    Side Iliopsoas Quads Hamstring PF DF EHL  R 5 5 5 5 5 5   L 5 5 5 5 5 5    Reflexes are 2+ and symmetric at the biceps, brachioradialis, patella and achilles.   Hoffman's is absent.  Clonus is not present.   Bilateral upper and lower extremity sensation is intact to light touch.     No pain with IR/ER of both hips.   Gait is normal.     Medical Decision Making  Imaging: Cervical xrays dated 12/13/23:  FINDINGS: AP, AP odontoid and lateral views in neutral, flexion and extension. Reversal of the usual cervical lordosis without focal angulation or significant listhesis. Flexion and extension views demonstrate limited range of motion, but no dynamic instability. The prevertebral soft tissues are normal.   There is multilevel spondylosis  with advanced disc space narrowing and endplate osteophytes at C5-6 and C6-7. No acute osseous findings are evident.   IMPRESSION: Multilevel cervical spondylosis with advanced disc space narrowing at C5-6 and C6-7. No acute osseous findings or dynamic instability.     Electronically Signed   By: Elmon Hagedorn M.D.   On: 12/29/2023 14:30   Cervical and lumbar MRI dated 12/13/23: FINDINGS: MRI CERVICAL SPINE FINDINGS   Alignment: Gradual reversal of lordosis. No focal angulation or significant listhesis.   Vertebrae: No acute or suspicious osseous findings.   Cord: Normal in signal and caliber.   Posterior Fossa, vertebral arteries, paraspinal tissues: Visualized portions of the posterior fossa appear unremarkable.Bilateral vertebral artery flow voids. No significant paraspinal findings.   Disc levels:   C2-3: Preserved disc height. Moderate asymmetric facet hypertrophy on the right contributing to moderate right foraminal narrowing. The spinal canal and left foramen are widely patent.   C3-4: Preserved disc height with  mild disc bulging and uncinate spurring. No significant spinal stenosis or foraminal narrowing.   C4-5: Preserved disc height with mild disc bulging and uncinate spurring. Asymmetric facet hypertrophy on the right. Mild right foraminal narrowing. The spinal canal and left foramen are patent.   C5-6: Chronic spondylosis with loss of disc height and posterior osteophytes covering diffusely bulging disc material. Mild spinal stenosis without cord deformity. Moderate foraminal narrowing bilaterally.   C6-7: Chronic spondylosis with loss of disc height and posterior osteophytes covering diffusely bulging disc material. Mild spinal stenosis without cord deformity. Mild right and moderate left foraminal narrowing.   C7-T1: No significant findings.   MRI LUMBAR SPINE FINDINGS   Segmentation: As correlated with previous thoracic MRI, there are 5 lumbar  type vertebral bodies.   Alignment: Degenerative grade 1 anterolisthesis at L4-5. Minimal convex left scoliosis.   Vertebrae: No worrisome osseous lesion, acute fracture or pars defect. Scattered small hemangiomas.   Conus medullaris: Extends to the L1-2 level and appears normal.   Paraspinal and other soft tissues: No significant paraspinal findings. Mild dilatation of the common bile duct to 8 mm.   Disc levels:   L1-2: Mild loss of disc height with mild disc bulging. No spinal stenosis or significant foraminal narrowing.   L2-3: Preserved disc height. Mild disc bulging and mild facet hypertrophy. No spinal stenosis or significant foraminal narrowing.   L3-4: Mild loss of disc height with annular disc bulging. Mild to moderate facet and ligamentous hypertrophy. Borderline spinal stenosis and mild right foraminal narrowing without nerve root encroachment.   L4-5: Mild loss of disc height with annular disc bulging/uncovering. Moderate facet hypertrophy accounting for the grade 1 anterolisthesis. With the patient supine, there is only mild resulting spinal stenosis with mild asymmetric right lateral recess and left foraminal narrowing. No definite nerve root encroachment.   L5-S1: Preserved disc height and hydration. Mild facet hypertrophy. No spinal stenosis or foraminal narrowing.   IMPRESSION: 1. No acute findings or clear explanation for the patient's symptoms in the cervical or lumbar spine. 2. Multilevel cervical spondylosis with mild spinal stenosis and moderate foraminal narrowing at C5-6 and C6-7, as described. 3. Mild multifactorial spinal stenosis and mild right foraminal narrowing at L3-4. 4. Mild spinal stenosis with mild asymmetric right lateral recess and left foraminal narrowing at L4-5. 5. Mild dilatation of the common bile duct to 8 mm. Correlate with liver function tests.     Electronically Signed   By: Elmon Hagedorn M.D.   On: 12/29/2023 14:45      I have personally reviewed the images and agree with the above interpretation.   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   She continues with constant neck pain with radiation to both shoulders. No arm pain. No weakness in her arms. Wakes up at night intermittently with numbness and tingling.  She has cervical spondylosis with mild central stenosis C5-C7 and multilevel foraminal stenosis.   She continues with constant LBP with no leg pain. No numbness, tingling, or weakness in her legs. Pain is worse with prolonged standing, exercise, twisting, bending.   She has minimal central stenosis L3-L4 with mild right foraminal stenosis along with slip L4-L5 with mild central and left foraminal stenosis.   She has no current thoracic pain.    Treatment options discussed with patient and following plan made:   - Recommend PT for cervical and lumbar spine. She declines.  -  Referral to PMR at [facility] to discuss possible cervical and lumbar injections.  - Follow up with PCP regarding mild dilatation common bile duct to 8 mm. Given MRI report.  - May consider lumbar xrays with flex/ext at some point.  - Follow up with me in 6-8 weeks and prn.   I spent a total of 25 minutes in face-to-face and non-face-to-face activities related to this patient's care today including review of outside records, review of imaging, review of symptoms, physical exam, discussion of differential diagnosis, discussion of treatment options, and documentation.   Yesenia Russel PA-C Dept. of Neurosurgery

## 2024-01-04 ENCOUNTER — Encounter: Payer: Self-pay | Admitting: Orthopedic Surgery

## 2024-01-04 ENCOUNTER — Ambulatory Visit: Admitting: Orthopedic Surgery

## 2024-01-04 VITALS — BP 134/82 | Ht 63.0 in | Wt 132.0 lb

## 2024-01-04 DIAGNOSIS — M4722 Other spondylosis with radiculopathy, cervical region: Secondary | ICD-10-CM | POA: Diagnosis not present

## 2024-01-04 DIAGNOSIS — M47812 Spondylosis without myelopathy or radiculopathy, cervical region: Secondary | ICD-10-CM

## 2024-01-04 DIAGNOSIS — M4802 Spinal stenosis, cervical region: Secondary | ICD-10-CM | POA: Diagnosis not present

## 2024-01-04 DIAGNOSIS — M48061 Spinal stenosis, lumbar region without neurogenic claudication: Secondary | ICD-10-CM

## 2024-01-04 DIAGNOSIS — M47816 Spondylosis without myelopathy or radiculopathy, lumbar region: Secondary | ICD-10-CM

## 2024-01-04 DIAGNOSIS — M4316 Spondylolisthesis, lumbar region: Secondary | ICD-10-CM

## 2024-01-04 NOTE — Patient Instructions (Signed)
 It was so nice to see you today. Thank you so much for coming in.    You have some wear and tear in your neck and lower back. You have a slight slip at L4-L5 in your lower back as well.   Ask your PCP about the mild dilatation of the common bile duct.   I want you to see physical medicine and rehab at the [facility] to discuss possible injections in your neck and lower back. Dr. Erman Hayward, Dr. Aleen Ammons, and their PA Laurina Popper are great and will take good care of you. They should call you to schedule an appointment or you can call them at [phone].   I will see you back in 6-8 weeks. Please do not hesitate to call if you have any questions or concerns. You can also message me in MyChart.   Lucetta Russel PA-C [phone]     The physicians and staff at [facility] at [facility] are committed to providing excellent care. You may receive a survey asking for feedback about your experience at our office. We value you your feedback and appreciate you taking the time to to fill it out. The [facility] leadership team is also available to discuss your experience in person, feel free to contact us  [phone].

## 2024-01-22 ENCOUNTER — Other Ambulatory Visit: Payer: Self-pay | Admitting: Nurse Practitioner

## 2024-02-08 ENCOUNTER — Ambulatory Visit: Admitting: Nurse Practitioner

## 2024-02-08 ENCOUNTER — Encounter: Payer: Self-pay | Admitting: Nurse Practitioner

## 2024-02-08 VITALS — BP 132/70 | HR 92 | Temp 98.4°F | Resp 16 | Ht 63.0 in | Wt 131.8 lb

## 2024-02-08 DIAGNOSIS — E781 Pure hyperglyceridemia: Secondary | ICD-10-CM

## 2024-02-08 DIAGNOSIS — M0580 Other rheumatoid arthritis with rheumatoid factor of unspecified site: Secondary | ICD-10-CM

## 2024-02-08 DIAGNOSIS — E538 Deficiency of other specified B group vitamins: Secondary | ICD-10-CM

## 2024-02-08 DIAGNOSIS — L659 Nonscarring hair loss, unspecified: Secondary | ICD-10-CM

## 2024-02-08 DIAGNOSIS — C49A3 Gastrointestinal stromal tumor of small intestine: Secondary | ICD-10-CM

## 2024-02-08 DIAGNOSIS — E782 Mixed hyperlipidemia: Secondary | ICD-10-CM

## 2024-02-08 DIAGNOSIS — M4802 Spinal stenosis, cervical region: Secondary | ICD-10-CM | POA: Diagnosis not present

## 2024-02-08 DIAGNOSIS — M5412 Radiculopathy, cervical region: Secondary | ICD-10-CM | POA: Diagnosis not present

## 2024-02-08 DIAGNOSIS — R7303 Prediabetes: Secondary | ICD-10-CM | POA: Diagnosis not present

## 2024-02-08 DIAGNOSIS — J3089 Other allergic rhinitis: Secondary | ICD-10-CM

## 2024-02-08 DIAGNOSIS — M47816 Spondylosis without myelopathy or radiculopathy, lumbar region: Secondary | ICD-10-CM | POA: Diagnosis not present

## 2024-02-08 DIAGNOSIS — E039 Hypothyroidism, unspecified: Secondary | ICD-10-CM

## 2024-02-08 DIAGNOSIS — F411 Generalized anxiety disorder: Secondary | ICD-10-CM

## 2024-02-08 DIAGNOSIS — F988 Other specified behavioral and emotional disorders with onset usually occurring in childhood and adolescence: Secondary | ICD-10-CM

## 2024-02-08 DIAGNOSIS — Z862 Personal history of diseases of the blood and blood-forming organs and certain disorders involving the immune mechanism: Secondary | ICD-10-CM

## 2024-02-08 DIAGNOSIS — E559 Vitamin D deficiency, unspecified: Secondary | ICD-10-CM

## 2024-02-08 MED ORDER — AMPHETAMINE-DEXTROAMPHET ER 10 MG PO CP24: 10.0000 mg | ORAL_CAPSULE | Freq: Every day | ORAL | 0 refills | Status: DC

## 2024-02-08 MED ORDER — METFORMIN HCL ER 500 MG PO TB24
500.0000 mg | ORAL_TABLET | Freq: Every day | ORAL | 1 refills | Status: DC
Start: 2024-02-08 — End: 2024-03-18

## 2024-02-08 MED ORDER — ALPRAZOLAM 0.5 MG PO TABS: 0.5000 mg | ORAL_TABLET | Freq: Every evening | ORAL | 2 refills | Status: DC | PRN

## 2024-02-08 MED ORDER — FEXOFENADINE-PSEUDOEPHED ER 60-120 MG PO TB12
1.0000 | ORAL_TABLET | Freq: Two times a day (BID) | ORAL | 3 refills | Status: DC
Start: 2024-02-08 — End: 2024-04-15

## 2024-02-08 MED ORDER — AMPHETAMINE-DEXTROAMPHETAMINE 10 MG PO TABS: 10.0000 mg | ORAL_TABLET | Freq: Every day | ORAL | 0 refills | Status: DC | PRN

## 2024-02-08 NOTE — Progress Notes (Signed)
 [facility] [address] [place], [place] [address]  Internal MEDICINE  Office Visit Note  Patient Name: Yesenia Ward  [id number]  [id number]  Date of Service: 02/08/2024  Chief Complaint  Patient presents with   Follow-up    HPI Yesenia Ward presents for a follow-up visit for high cholesterol, prediabetes, ADHD, GIST, fatigue, hair loss and elevated RF.  Hyperlipidemia -- hypertriglyceridemia, high LDL >200. Due for repeat lipid panel  Prediabetes ADHD -- current dose is effective. BP and heart rate are normal. Denies any palpitations or any other adverse side effects of the medication. Due for refills.  Sees oncology for GIST Fatigue and hair loss -- wants to have labs drawn  Elevated rheumatoid factor on previous labs as well as arthritis of multiple joints.     Current Medication: Outpatient Encounter Medications as of 02/08/2024  Medication Sig   acetaminophen (TYLENOL) 500 MG tablet Take 500 mg by mouth every 6 (six) hours as needed.   ALPRAZolam  (XANAX ) 0.5 MG tablet Take 1-2 tablets (0.5-1 mg total) by mouth at bedtime as needed for anxiety. Take 1-2 tablets as needed at bedtime.   [START ON 04/04/2024] amphetamine -dextroamphetamine  (ADDERALL XR) 10 MG 24 hr capsule Take 1 capsule (10 mg total) by mouth daily.   [START ON 03/07/2024] amphetamine -dextroamphetamine  (ADDERALL XR) 10 MG 24 hr capsule Take 1 capsule (10 mg total) by mouth daily.   amphetamine -dextroamphetamine  (ADDERALL XR) 10 MG 24 hr capsule Take 1 capsule (10 mg total) by mouth daily.   amphetamine -dextroamphetamine  (ADDERALL) 10 MG tablet Take 1 tablet (10 mg total) by mouth daily as needed (ADHD symptoms).   fexofenadine -pseudoephedrine (ALLEGRA-D ALLERGY & CONGESTION) 60-120 MG 12 hr tablet Take 1 tablet by mouth 2 (two) times daily.   levothyroxine  (SYNTHROID ) 125 MCG tablet Take 1 tablet (125 mcg total) by mouth daily before breakfast.   metFORMIN  (GLUCOPHAGE -XR) 500 MG 24 hr tablet Take 1 tablet  (500 mg total) by mouth daily with breakfast.   ondansetron  (ZOFRAN ) 4 MG tablet TAKE 1 TABLET(4 MG) BY MOUTH EVERY 8 HOURS AS NEEDED FOR NAUSEA OR VOMITING   Oxymetazoline HCl (UPNEEQ) 0.1 % SOLN Apply to eye.   valACYclovir  (VALTREX ) 1000 MG tablet Take 1 tablet (1,000 mg total) by mouth 2 (two) times daily. During outbreak/flare until resolved   [DISCONTINUED] ALPRAZolam  (XANAX ) 0.5 MG tablet Take 1-2 tablets (0.5-1 mg total) by mouth at bedtime as needed for anxiety. Take 1-2 tablets as needed at bedtime.   [DISCONTINUED] amphetamine -dextroamphetamine  (ADDERALL XR) 10 MG 24 hr capsule Take 1 capsule (10 mg total) by mouth daily.   [DISCONTINUED] amphetamine -dextroamphetamine  (ADDERALL XR) 10 MG 24 hr capsule Take 1 capsule (10 mg total) by mouth daily.   [DISCONTINUED] amphetamine -dextroamphetamine  (ADDERALL XR) 10 MG 24 hr capsule Take 1 capsule (10 mg total) by mouth daily.   [DISCONTINUED] amphetamine -dextroamphetamine  (ADDERALL) 10 MG tablet Take 1 tablet (10 mg total) by mouth daily as needed (ADHD symptoms).   [DISCONTINUED] fexofenadine -pseudoephedrine (ALLEGRA-D ALLERGY & CONGESTION) 60-120 MG 12 hr tablet Take 1 tablet by mouth twice daily.   [DISCONTINUED] metFORMIN  (GLUCOPHAGE -XR) 500 MG 24 hr tablet Take 1 tablet (500 mg total) by mouth daily with breakfast.   No facility-administered encounter medications on file as of 02/08/2024.    Surgical History: Past Surgical History:  Procedure Laterality Date   AUGMENTATION MAMMAPLASTY Bilateral 2005   BREAST BIOPSY Right 10/18/2022   Us  Core Bx, Coil Clip - path pending   BREAST BIOPSY Right 10/18/2022   US  RT BREAST  BX W LOC DEV 1ST LESION IMG BX SPEC US  GUIDE 10/18/2022 [facility]   gist tumor remooval     OVARIAN CYST REMOVAL Left    RESECTION SMALL BOWEL / CLOSURE ILEOSTOMY      Medical History: Past Medical History:  Diagnosis Date   GIST (gastrointestinal stromal tumor), non-malignant    Hypothyroid    Tumor      Family History: Family History  Problem Relation Age of Onset   Diabetes Mother    Coronary artery disease Mother    Cirrhosis Father    Cancer Maternal Uncle    Rectal cancer Paternal Aunt    Cancer Paternal Aunt     Social History   Socioeconomic History   Marital status: Married    Spouse name: Not on file   Number of children: Not on file   Years of education: Not on file   Highest education level: Not on file  Occupational History   Not on file  Tobacco Use   Smoking status: Former   Smokeless tobacco: Never  Vaping Use   Vaping status: Never Used  Substance and Sexual Activity   Alcohol use: Yes    Comment: occasionally   Drug use: Never   Sexual activity: Not on file  Other Topics Concern   Not on file  Social History Narrative   Not on file   Social Drivers of Health   Financial Resource Strain: Low Risk  (02/08/2024)   Received from [facility]   Overall Financial Resource Strain (CARDIA)    Difficulty of Paying Living Expenses: Not hard at all  Food Insecurity: No Food Insecurity (02/08/2024)   Received from [facility]   Hunger Vital Sign    Within the past 12 months, you worried that your food would run out before you got the money to buy more.: Never true    Within the past 12 months, the food you bought just didn't last and you didn't have money to get more.: Never true  Transportation Needs: No Transportation Needs (02/08/2024)   [facility] from [facility] - Transportation    In the past 12 months, has lack of transportation kept you from medical appointments or from getting medications?: No    Lack of Transportation (Non-Medical): No  Physical Activity: Not on file  Stress: Not on file  Social Connections: Not on file  Intimate Partner Violence: Not on file      Review of Systems  Constitutional:  Negative for chills, fatigue and unexpected weight change.  HENT:  Negative  for congestion, rhinorrhea, sneezing and sore throat.   Eyes:  Negative for redness.  Respiratory:  Negative for cough, chest tightness and shortness of breath.   Cardiovascular:  Negative for chest pain and palpitations.  Gastrointestinal:  Negative for abdominal pain, constipation, diarrhea, nausea and vomiting.  Genitourinary:  Negative for dysuria and frequency.  Musculoskeletal:  Negative for arthralgias, back pain, joint swelling and neck pain.  Skin:  Negative for rash.  Neurological: Negative.  Negative for tremors and numbness.  Hematological:  Negative for adenopathy. Does not bruise/bleed easily.  Psychiatric/Behavioral:  Negative for behavioral problems (Depression), sleep disturbance and suicidal ideas. The patient is not nervous/anxious.     Vital Signs: BP 132/70   Pulse 92   Temp 98.4 F (36.9 C)   Resp 16   Ht 5' 3 (1.6 m)   Wt 131 lb 12.8 oz (59.8 kg)   SpO2  97%   BMI 23.35 kg/m    Physical Exam Vitals reviewed.  Constitutional:      General: She is not in acute distress.    Appearance: Normal appearance. She is normal weight. She is not ill-appearing.  HENT:     Head: Normocephalic and atraumatic.  Eyes:     Pupils: Pupils are equal, round, and reactive to light.  Cardiovascular:     Rate and Rhythm: Normal rate and regular rhythm.  Pulmonary:     Effort: Pulmonary effort is normal. No respiratory distress.  Neurological:     Mental Status: She is alert and oriented to person, place, and time.     Cranial Nerves: No cranial nerve deficit.     Coordination: Coordination normal.     Gait: Gait normal.  Psychiatric:        Mood and Affect: Mood normal.        Behavior: Behavior normal.        Assessment/Plan: 1. Prediabetes (Primary) Continue metformin  as prescribed. Routine labs ordered.  - metFORMIN  (GLUCOPHAGE -XR) 500 MG 24 hr tablet; Take 1 tablet (500 mg total) by mouth daily with breakfast.  Dispense: 90 tablet; Refill: 1 - NMR Lipoprof  wSubCls+Graph - Lipoprotein A (LPA) - CBC with Differential/Platelet - CMP14+EGFR - TSH + free T4  2. Acquired hypothyroidism Routine labs ordered  - CBC with Differential/Platelet - CMP14+EGFR - TSH + free T4 - Vitamin D  (25 hydroxy)  3. Hypertriglyceridemia Routine labs ordered  - NMR Lipoprof wSubCls+Graph - Lipoprotein A (LPA) - TSH + free T4  4. Mixed hyperlipidemia Routine labs ordered  - NMR Lipoprof wSubCls+Graph - Lipoprotein A (LPA) - CBC with Differential/Platelet - CMP14+EGFR - TSH + free T4  5. Polyarthritis with positive rheumatoid factor (HCC) Additional labs ordered  - ANA Direct w/Reflex if Positive - Rheumatoid Arthritis Profile - TSH + free T4  6. Non-seasonal allergic rhinitis due to other allergic trigger Continue allegra D as prescribed  - fexofenadine -pseudoephedrine (ALLEGRA-D ALLERGY & CONGESTION) 60-120 MG 12 hr tablet; Take 1 tablet by mouth 2 (two) times daily.  Dispense: 60 tablet; Refill: 3  7. B12 deficiency Routine labs ordered  - CBC with Differential/Platelet - B12 and Folate Panel  8. Vitamin D  deficiency Routine lab ordered  - Vitamin D  (25 hydroxy)  9. 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   10. History of iron deficiency anemia Routine labs ordered  - CBC with Differential/Platelet - B12 and Folate Panel  11. Generalized anxiety disorder Continue prn alprazolam  as prescribed, follow up in 3 months for additional refills  - ALPRAZolam  (XANAX ) 0.5 MG tablet; Take 1-2 tablets (0.5-1 mg total) by mouth at bedtime as needed for anxiety. Take 1-2 tablets as needed at bedtime.  Dispense: 60 tablet; Refill: 2  12. Adult attention deficit disorder Continue adderall as prescribed, follow up in 3 months for additional refills.  - amphetamine -dextroamphetamine  (ADDERALL XR) 10 MG 24 hr capsule; Take 1 capsule (10 mg total) by mouth daily.  Dispense: 30 capsule; Refill:  0 - amphetamine -dextroamphetamine  (ADDERALL XR) 10 MG 24 hr capsule; Take 1 capsule (10 mg total) by mouth daily.  Dispense: 30 capsule; Refill: 0 - amphetamine -dextroamphetamine  (ADDERALL) 10 MG tablet; Take 1 tablet (10 mg total) by mouth daily as needed (ADHD symptoms).  Dispense: 90 tablet; Refill: 0 - amphetamine -dextroamphetamine  (ADDERALL XR) 10 MG 24 hr capsule; Take 1 capsule (10 mg total) by mouth daily.  Dispense: 30 capsule; Refill: 0  General Counseling: mikaela hilgeman understanding of the findings of todays visit and agrees with plan of treatment. I have discussed any further diagnostic evaluation that may be needed or ordered today. We also reviewed her medications today. she has been encouraged to call the office with any questions or concerns that should arise related to todays visit.    Orders Placed This Encounter  Procedures   NMR Lipoprof wSubCls+Graph   Lipoprotein A (LPA)   CBC with Differential/Platelet   CMP14+EGFR   ANA Direct w/Reflex if Positive   Rheumatoid Arthritis Profile   TSH + free T4   Vitamin D  (25 hydroxy)   B12 and Folate Panel    Meds ordered this encounter  Medications   amphetamine -dextroamphetamine  (ADDERALL XR) 10 MG 24 hr capsule    Sig: Take 1 capsule (10 mg total) by mouth daily.    Dispense:  30 capsule    Refill:  0    Fill for August   amphetamine -dextroamphetamine  (ADDERALL XR) 10 MG 24 hr capsule    Sig: Take 1 capsule (10 mg total) by mouth daily.    Dispense:  30 capsule    Refill:  0    Fill for july   amphetamine -dextroamphetamine  (ADDERALL) 10 MG tablet    Sig: Take 1 tablet (10 mg total) by mouth daily as needed (ADHD symptoms).    Dispense:  90 tablet    Refill:  0    3 month supply of extra additional dose to be taken after taking her 20 mg XR adderall dose,   amphetamine -dextroamphetamine  (ADDERALL XR) 10 MG 24 hr capsule    Sig: Take 1 capsule (10 mg total) by mouth daily.    Dispense:  30 capsule    Refill:   0    Fill for june   ALPRAZolam  (XANAX ) 0.5 MG tablet    Sig: Take 1-2 tablets (0.5-1 mg total) by mouth at bedtime as needed for anxiety. Take 1-2 tablets as needed at bedtime.    Dispense:  60 tablet    Refill:  2   metFORMIN  (GLUCOPHAGE -XR) 500 MG 24 hr tablet    Sig: Take 1 tablet (500 mg total) by mouth daily with breakfast.    Dispense:  90 tablet    Refill:  1    Prediabetes and high cholesterol   fexofenadine -pseudoephedrine (ALLEGRA-D ALLERGY & CONGESTION) 60-120 MG 12 hr tablet    Sig: Take 1 tablet by mouth 2 (two) times daily.    Dispense:  60 tablet    Refill:  3    Return in about 3 months (around 05/01/2024) for F/U, ADHD med check, Eriverto Byrnes PCP, Labs.   Total time spent:30 Minutes Time spent includes review of chart, medications, test results, and follow up plan with the patient.   [place] Controlled Substance Database was reviewed by me.  This patient was seen by Mardy Maxin, FNP-C in collaboration with Dr. Sigrid Bathe as a part of collaborative care agreement.   Sicily Zaragoza R. Maxin, MSN, FNP-C Internal medicine

## 2024-02-12 DIAGNOSIS — Z8509 Personal history of malignant neoplasm of other digestive organs: Secondary | ICD-10-CM | POA: Diagnosis not present

## 2024-02-12 DIAGNOSIS — C49A3 Gastrointestinal stromal tumor of small intestine: Secondary | ICD-10-CM | POA: Diagnosis not present

## 2024-02-12 DIAGNOSIS — K219 Gastro-esophageal reflux disease without esophagitis: Secondary | ICD-10-CM | POA: Diagnosis not present

## 2024-02-12 DIAGNOSIS — K639 Disease of intestine, unspecified: Secondary | ICD-10-CM | POA: Diagnosis not present

## 2024-02-14 DIAGNOSIS — M545 Low back pain, unspecified: Secondary | ICD-10-CM | POA: Diagnosis not present

## 2024-02-14 DIAGNOSIS — M542 Cervicalgia: Secondary | ICD-10-CM | POA: Diagnosis not present

## 2024-02-16 NOTE — Progress Notes (Deleted)
 Referring Physician:  Laurence Pons, NP [address] [place],  [place] [address]  Primary Physician:  Laurence Pons, NP  History of Present Illness: Ms. 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   Last seen by me on 01/04/24 for neck and LBP.   She has cervical spondylosis with mild central stenosis C5-C7 and multilevel foraminal stenosis.   She also has minimal central stenosis L3-L4 with mild right foraminal stenosis along with slip L4-L5 with mild central and left foraminal stenosis.   She was sent to PMR- [facility] started her on mobic and robaxin. She was sent to IR for right C6-C7 TF ESI***. She was referred to PT as well.   She is here for follow up.        Was to follow up with PC regarding mild dilatation common bile duct to 8 mm.    MRI of cervical and lumbar spine ordered along with cervical xrays. She is here to review them.   She continues with constant neck pain with radiation to both shoulders. No arm pain. No weakness in her arms. Wakes up at night intermittently with numbness and tingling.  She continues with constant LBP with no leg pain. No numbness, tingling, or weakness in her legs. Pain is worse with prolonged standing, exercise, twisting, bending.   Her thoracic pain is gone.   Some relief with rest and tylenol.   Bowel/Bladder Dysfunction: no urgency, no bowel issues, no perineal numbness. Notes some dribbling when she stands up after voiding x months. This is not all the time.   She does not smoke.   Conservative measures:  Physical therapy:  has not participated in Multimodal medical therapy including regular antiinflammatories:  tylenol Injections:  no epidural steroid injections  Past Surgery: no spinal surgeries  Yesenia Ward has no symptoms of cervical myelopathy.  The symptoms are causing a significant  impact on the patient's life.   Review of Systems:  A 10 point review of systems is negative, except for the pertinent positives and negatives detailed in the HPI.  Past Medical History: Past Medical History:  Diagnosis Date   GIST (gastrointestinal stromal tumor), non-malignant    Hypothyroid    Tumor     Past Surgical History: Past Surgical History:  Procedure Laterality Date   AUGMENTATION MAMMAPLASTY Bilateral 2005   BREAST BIOPSY Right 10/18/2022   Us  Core Bx, Coil Clip - path pending   BREAST BIOPSY Right 10/18/2022   US  RT BREAST BX W LOC DEV 1ST LESION IMG BX SPEC US  GUIDE 10/18/2022 [facility]   gist tumor remooval     OVARIAN CYST REMOVAL Left    RESECTION SMALL BOWEL / CLOSURE ILEOSTOMY      Allergies: Allergies as of 02/28/2024 - Review Complete 02/08/2024  Allergen Reaction Noted   Gabapentin Anaphylaxis 05/01/2018   Amoxicillin-pot clavulanate Nausea And Vomiting 05/01/2018   Other Swelling 05/01/2018   Soy allergy (obsolete)  05/01/2018   Sulfamethoxazole-trimethoprim Other (See Comments) 05/01/2018    Medications: Outpatient Encounter Medications as of 02/28/2024  Medication Sig   acetaminophen (TYLENOL) 500 MG tablet Take 500 mg by mouth every 6 (six) hours as needed.   ALPRAZolam  (XANAX ) 0.5 MG tablet Take 1-2 tablets (0.5-1 mg total) by mouth at bedtime as needed for anxiety. Take 1-2 tablets as needed at bedtime.   [START ON 04/04/2024] amphetamine -dextroamphetamine  (ADDERALL XR) 10 MG 24 hr  capsule Take 1 capsule (10 mg total) by mouth daily.   [START ON 03/07/2024] amphetamine -dextroamphetamine  (ADDERALL XR) 10 MG 24 hr capsule Take 1 capsule (10 mg total) by mouth daily.   amphetamine -dextroamphetamine  (ADDERALL XR) 10 MG 24 hr capsule Take 1 capsule (10 mg total) by mouth daily.   amphetamine -dextroamphetamine  (ADDERALL) 10 MG tablet Take 1 tablet (10 mg total) by mouth daily as needed (ADHD symptoms).   fexofenadine -pseudoephedrine (ALLEGRA-D  ALLERGY & CONGESTION) 60-120 MG 12 hr tablet Take 1 tablet by mouth 2 (two) times daily.   levothyroxine  (SYNTHROID ) 125 MCG tablet Take 1 tablet (125 mcg total) by mouth daily before breakfast.   metFORMIN  (GLUCOPHAGE -XR) 500 MG 24 hr tablet Take 1 tablet (500 mg total) by mouth daily with breakfast.   ondansetron  (ZOFRAN ) 4 MG tablet TAKE 1 TABLET(4 MG) BY MOUTH EVERY 8 HOURS AS NEEDED FOR NAUSEA OR VOMITING   Oxymetazoline HCl (UPNEEQ) 0.1 % SOLN Apply to eye.   valACYclovir  (VALTREX ) 1000 MG tablet Take 1 tablet (1,000 mg total) by mouth 2 (two) times daily. During outbreak/flare until resolved   No facility-administered encounter medications on file as of 02/28/2024.    Social History: Social History   Tobacco Use   Smoking status: Former   Smokeless tobacco: Never  Advertising account planner   Vaping status: Never Used  Substance Use Topics   Alcohol use: Yes    Comment: occasionally   Drug use: Never    Family Medical History: Family History  Problem Relation Age of Onset   Diabetes Mother    Coronary artery disease Mother    Cirrhosis Father    Cancer Maternal Uncle    Rectal cancer Paternal Aunt    Cancer Paternal Aunt     Physical Examination: There were no vitals filed for this visit.   Awake, alert, oriented to person, place, and time.  Speech is clear and fluent. Fund of knowledge is appropriate.   Cranial Nerves: Pupils equal round and reactive to light.  Facial tone is symmetric.    No abnormal lesions on exposed skin.   Strength: Side Biceps Triceps Deltoid Interossei Grip Wrist Ext. Wrist Flex.  R 5 5 5 5 5 5 5   L 5 5 5 5 5 5 5    Side Iliopsoas Quads Hamstring PF DF EHL  R 5 5 5 5 5 5   L 5 5 5 5 5 5    Reflexes are 2+ and symmetric at the biceps, brachioradialis, patella and achilles.   Hoffman's is absent.  Clonus is not present.   Bilateral upper and lower extremity sensation is intact to light touch.     No pain with IR/ER of both hips.   Gait is normal.      Medical Decision Making  Imaging: none   Assessment and Plan: Ms. Yesenia Ward has history of malignant GI stromal tumor (GIST) of small intestine s/p surgical removal 10 years ago.   She continues with constant neck pain with radiation to both shoulders. No arm pain. No weakness in her arms. Wakes up at night intermittently with numbness and tingling.  She has cervical spondylosis with mild central stenosis C5-C7 and multilevel foraminal stenosis.   She continues with constant LBP with no leg pain. No numbness, tingling, or weakness in her legs. Pain is worse with prolonged standing, exercise, twisting, bending.   She has minimal central stenosis L3-L4 with mild right foraminal stenosis along with slip L4-L5 with mild central and left foraminal stenosis.   She has  no current thoracic pain.    Treatment options discussed with patient and following plan made:   - Recommend PT for cervical and lumbar spine. She declines.  - Referral to PMR at [facility] to discuss possible cervical and lumbar injections.  - Follow up with PCP regarding mild dilatation common bile duct to 8 mm. Given MRI report.  - May consider lumbar xrays with flex/ext at some point.  - Follow up with me in 6-8 weeks and prn.   I spent a total of 25 minutes in face-to-face and non-face-to-face activities related to this patient's care today including review of outside records, review of imaging, review of symptoms, physical exam, discussion of differential diagnosis, discussion of treatment options, and documentation.   Lucetta Russel PA-C Dept. of Neurosurgery

## 2024-02-21 DIAGNOSIS — M5412 Radiculopathy, cervical region: Secondary | ICD-10-CM | POA: Diagnosis not present

## 2024-02-21 DIAGNOSIS — M4802 Spinal stenosis, cervical region: Secondary | ICD-10-CM | POA: Diagnosis not present

## 2024-02-21 DIAGNOSIS — M542 Cervicalgia: Secondary | ICD-10-CM | POA: Diagnosis not present

## 2024-02-21 DIAGNOSIS — M545 Low back pain, unspecified: Secondary | ICD-10-CM | POA: Diagnosis not present

## 2024-02-28 ENCOUNTER — Ambulatory Visit: Admitting: Orthopedic Surgery

## 2024-02-28 DIAGNOSIS — M542 Cervicalgia: Secondary | ICD-10-CM | POA: Diagnosis not present

## 2024-02-28 DIAGNOSIS — M545 Low back pain, unspecified: Secondary | ICD-10-CM | POA: Diagnosis not present

## 2024-03-07 DIAGNOSIS — M542 Cervicalgia: Secondary | ICD-10-CM | POA: Diagnosis not present

## 2024-03-07 DIAGNOSIS — M545 Low back pain, unspecified: Secondary | ICD-10-CM | POA: Diagnosis not present

## 2024-03-10 ENCOUNTER — Encounter: Payer: Self-pay | Admitting: Nurse Practitioner

## 2024-03-10 DIAGNOSIS — E782 Mixed hyperlipidemia: Secondary | ICD-10-CM | POA: Insufficient documentation

## 2024-03-10 DIAGNOSIS — M0580 Other rheumatoid arthritis with rheumatoid factor of unspecified site: Secondary | ICD-10-CM | POA: Insufficient documentation

## 2024-03-10 DIAGNOSIS — Z862 Personal history of diseases of the blood and blood-forming organs and certain disorders involving the immune mechanism: Secondary | ICD-10-CM | POA: Insufficient documentation

## 2024-03-10 DIAGNOSIS — E781 Pure hyperglyceridemia: Secondary | ICD-10-CM | POA: Insufficient documentation

## 2024-03-11 ENCOUNTER — Other Ambulatory Visit: Payer: Self-pay

## 2024-03-11 DIAGNOSIS — E039 Hypothyroidism, unspecified: Secondary | ICD-10-CM

## 2024-03-11 MED ORDER — LEVOTHYROXINE SODIUM 125 MCG PO TABS: 125.0000 ug | ORAL_TABLET | Freq: Every day | ORAL | 3 refills | Status: DC

## 2024-03-14 DIAGNOSIS — M545 Low back pain, unspecified: Secondary | ICD-10-CM | POA: Diagnosis not present

## 2024-03-14 DIAGNOSIS — M542 Cervicalgia: Secondary | ICD-10-CM | POA: Diagnosis not present

## 2024-03-18 ENCOUNTER — Encounter: Payer: Self-pay | Admitting: Orthopedic Surgery

## 2024-03-18 ENCOUNTER — Ambulatory Visit: Admitting: Orthopedic Surgery

## 2024-03-18 VITALS — BP 130/82 | Ht 63.0 in | Wt 131.0 lb

## 2024-03-18 DIAGNOSIS — R7303 Prediabetes: Secondary | ICD-10-CM | POA: Diagnosis not present

## 2024-03-18 DIAGNOSIS — E538 Deficiency of other specified B group vitamins: Secondary | ICD-10-CM | POA: Diagnosis not present

## 2024-03-18 DIAGNOSIS — Z862 Personal history of diseases of the blood and blood-forming organs and certain disorders involving the immune mechanism: Secondary | ICD-10-CM | POA: Diagnosis not present

## 2024-03-18 DIAGNOSIS — F988 Other specified behavioral and emotional disorders with onset usually occurring in childhood and adolescence: Secondary | ICD-10-CM | POA: Diagnosis not present

## 2024-03-18 DIAGNOSIS — M4802 Spinal stenosis, cervical region: Secondary | ICD-10-CM | POA: Diagnosis not present

## 2024-03-18 DIAGNOSIS — M47816 Spondylosis without myelopathy or radiculopathy, lumbar region: Secondary | ICD-10-CM

## 2024-03-18 DIAGNOSIS — E782 Mixed hyperlipidemia: Secondary | ICD-10-CM | POA: Diagnosis not present

## 2024-03-18 DIAGNOSIS — M47812 Spondylosis without myelopathy or radiculopathy, cervical region: Secondary | ICD-10-CM | POA: Diagnosis not present

## 2024-03-18 DIAGNOSIS — F411 Generalized anxiety disorder: Secondary | ICD-10-CM | POA: Diagnosis not present

## 2024-03-18 DIAGNOSIS — M5412 Radiculopathy, cervical region: Secondary | ICD-10-CM | POA: Diagnosis not present

## 2024-03-18 DIAGNOSIS — E781 Pure hyperglyceridemia: Secondary | ICD-10-CM | POA: Diagnosis not present

## 2024-03-18 DIAGNOSIS — M48061 Spinal stenosis, lumbar region without neurogenic claudication: Secondary | ICD-10-CM | POA: Diagnosis not present

## 2024-03-18 DIAGNOSIS — E559 Vitamin D deficiency, unspecified: Secondary | ICD-10-CM | POA: Diagnosis not present

## 2024-03-18 DIAGNOSIS — E039 Hypothyroidism, unspecified: Secondary | ICD-10-CM | POA: Diagnosis not present

## 2024-03-18 DIAGNOSIS — M4316 Spondylolisthesis, lumbar region: Secondary | ICD-10-CM

## 2024-03-18 NOTE — Progress Notes (Signed)
 Referring Physician:  No referring provider defined for this encounter.  Primary Physician:  Liana Fish, NP  History of Present Illness: Ms. 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   Last seen by me on 01/04/24 for neck and LBP.   She has cervical spondylosis with mild central stenosis C5-C7 and multilevel foraminal stenosis.   She also has minimal central stenosis L3-L4 with mild right foraminal stenosis along with slip L4-L5 with mild central and left foraminal stenosis.   She was sent to PMR- [facility] started her on mobic and robaxin. She had right C6-C7 TF ESI on 02/21/24. She was referred to PT as well.   She is here for follow up.   She had some relief with cervical injection (65-70% better) but she still has some minimal neck pain. No arm pain. No numbness, tingling, or weakness. This is more tolerable.   She still has constant LBP. She notes some pain in her left knee that has improved with dry needling. No numbness, tingling, or weakness in her legs. She is in PT for her back.   Some relief with rest and tylenol.   Bowel/Bladder Dysfunction: no urgency, no bowel issues, no perineal numbness.   She does not smoke.   Conservative measures:  Physical therapy: in PT at [facility]  Multimodal medical therapy including regular antiinflammatories:  tylenol Injections:   right C6-C7 TF ESI on 02/21/24  Past Surgery: no spinal surgeries  Hyla Coard has no symptoms of cervical myelopathy.  The symptoms are causing a significant impact on the patient's life.   Review of Systems:  A 10 point review of systems is negative, except for the pertinent positives and negatives detailed in the HPI.  Past Medical History: Past Medical History:  Diagnosis Date   GIST (gastrointestinal stromal tumor), non-malignant    Hypothyroid    Tumor     Past  Surgical History: Past Surgical History:  Procedure Laterality Date   AUGMENTATION MAMMAPLASTY Bilateral 2005   BREAST BIOPSY Right 10/18/2022   Us  Core Bx, Coil Clip - path pending   BREAST BIOPSY Right 10/18/2022   US  RT BREAST BX W LOC DEV 1ST LESION IMG BX SPEC US  GUIDE 10/18/2022 [facility]   gist tumor remooval     OVARIAN CYST REMOVAL Left    RESECTION SMALL BOWEL / CLOSURE ILEOSTOMY      Allergies: Allergies as of 03/18/2024 - Review Complete 03/18/2024  Allergen Reaction Noted   Gabapentin Anaphylaxis 05/01/2018   Amoxicillin-pot clavulanate Nausea And Vomiting 05/01/2018   Other Swelling 05/01/2018   Soy allergy (obsolete)  05/01/2018   Sulfamethoxazole-trimethoprim Other (See Comments) 05/01/2018    Medications: Outpatient Encounter Medications as of 03/18/2024  Medication Sig   acetaminophen (TYLENOL) 500 MG tablet Take 500 mg by mouth every 6 (six) hours as needed.   ALPRAZolam  (XANAX ) 0.5 MG tablet Take 1-2 tablets (0.5-1 mg total) by mouth at bedtime as needed for anxiety. Take 1-2 tablets as needed at bedtime.   [START ON 04/04/2024] amphetamine -dextroamphetamine  (ADDERALL XR) 10 MG 24 hr capsule Take 1 capsule (10 mg total) by mouth daily.   amphetamine -dextroamphetamine  (ADDERALL XR) 10 MG 24 hr capsule Take 1 capsule (10 mg total) by mouth daily.   amphetamine -dextroamphetamine  (ADDERALL XR) 10 MG 24 hr capsule Take 1 capsule (10 mg total) by mouth daily.   amphetamine -dextroamphetamine  (ADDERALL) 10 MG tablet Take 1 tablet (  10 mg total) by mouth daily as needed (ADHD symptoms).   fexofenadine -pseudoephedrine (ALLEGRA-D ALLERGY & CONGESTION) 60-120 MG 12 hr tablet Take 1 tablet by mouth 2 (two) times daily.   levothyroxine  (SYNTHROID ) 125 MCG tablet Take 1 tablet (125 mcg total) by mouth daily before breakfast.   meloxicam (MOBIC) 15 MG tablet Take 15 mg by mouth daily.   methocarbamol (ROBAXIN) 750 MG tablet Take 750 mg by mouth 2 (two) times daily.    ondansetron  (ZOFRAN ) 4 MG tablet TAKE 1 TABLET(4 MG) BY MOUTH EVERY 8 HOURS AS NEEDED FOR NAUSEA OR VOMITING   Oxymetazoline HCl (UPNEEQ) 0.1 % SOLN Apply to eye.   valACYclovir  (VALTREX ) 1000 MG tablet Take 1 tablet (1,000 mg total) by mouth 2 (two) times daily. During outbreak/flare until resolved   [DISCONTINUED] metFORMIN  (GLUCOPHAGE -XR) 500 MG 24 hr tablet Take 1 tablet (500 mg total) by mouth daily with breakfast.   No facility-administered encounter medications on file as of 03/18/2024.    Social History: Social History   Tobacco Use   Smoking status: Former   Smokeless tobacco: Never  Advertising account planner   Vaping status: Never Used  Substance Use Topics   Alcohol use: Yes    Comment: occasionally   Drug use: Never    Family Medical History: Family History  Problem Relation Age of Onset   Diabetes Mother    Coronary artery disease Mother    Cirrhosis Father    Cancer Maternal Uncle    Rectal cancer Paternal Aunt    Cancer Paternal Aunt     Physical Examination: Vitals:   03/18/24 1332  BP: 130/82     Awake, alert, oriented to person, place, and time.  Speech is clear and fluent. Fund of knowledge is appropriate.   Cranial Nerves: Pupils equal round and reactive to light.  Facial tone is symmetric.    No abnormal lesions on exposed skin.   Strength: Side Biceps Triceps Deltoid Interossei Grip Wrist Ext. Wrist Flex.  R 5 5 5 5 5 5 5   L 5 5 5 5 5 5 5    Side Iliopsoas Quads Hamstring PF DF EHL  R 5 5 5 5 5 5   L 5 5 5 5 5 5    Reflexes are 2+ and symmetric at the biceps, brachioradialis, patella and achilles.   Hoffman's is absent.  Clonus is not present.   Bilateral upper and lower extremity sensation is intact to light touch.     No pain with IR/ER of both hips.   Mild lateral joint line tenderness right knee.   Gait is normal.     Medical Decision Making  Imaging: none   Assessment and Plan: Ms. Shiffman has history of malignant GI stromal tumor  (GIST) of small intestine s/p surgical removal 10 years ago.   She had some relief with cervical injection (65-70% better) but she still has some minimal neck pain. No arm pain. No numbness, tingling, or weakness. This is more tolerable.   She has cervical spondylosis with mild central stenosis C5-C7 and multilevel foraminal stenosis.   She still has constant LBP. She notes some pain in her left knee that has improved with dry needling. No numbness, tingling, or weakness in her legs.   She has minimal central stenosis L3-L4 with mild right foraminal stenosis along with slip L4-L5 with mild central and left foraminal stenosis.   Treatment options discussed with patient and following plan made:   - Continue with PT for lumbar spine.  -  Hold on further treatment for cervical spine as she is doing well. If pain gets worse, consider PT for cervical spine.  - Has follow up with Whitney today. Will ask her about lumbar injections.  - She will continue to follow up with PMR for now. I will see her back prn.  - Will message her in 6 weeks to check on her.  - If right knee pain gets worse, can refer to ortho. She declines today.  - If LBP gets worse, would get lumbar xrays with flex/ext.   I spent a total of 25 minutes in face-to-face and non-face-to-face activities related to this patient's care today including review of outside records, review of imaging, review of symptoms, physical exam, discussion of differential diagnosis, discussion of treatment options, and documentation.   Glade Boys PA-C Dept. of Neurosurgery

## 2024-03-20 LAB — CBC WITH DIFFERENTIAL/PLATELET
Basophils Absolute: 0 x10E3/uL (ref 0.0–0.2)
Basos: 1 %
EOS (ABSOLUTE): 0.1 x10E3/uL (ref 0.0–0.4)
Eos: 3 %
Hematocrit: 43.7 % (ref 34.0–46.6)
Hemoglobin: 13.9 g/dL (ref 11.1–15.9)
Immature Grans (Abs): 0 x10E3/uL (ref 0.0–0.1)
Immature Granulocytes: 0 %
Lymphocytes Absolute: 1.5 x10E3/uL (ref 0.7–3.1)
Lymphs: 37 %
MCH: 31.1 pg (ref 26.6–33.0)
MCHC: 31.8 g/dL (ref 31.5–35.7)
MCV: 98 fL — ABNORMAL HIGH (ref 79–97)
Monocytes Absolute: 0.4 x10E3/uL (ref 0.1–0.9)
Monocytes: 10 %
Neutrophils Absolute: 2 x10E3/uL (ref 1.4–7.0)
Neutrophils: 49 %
Platelets: 340 x10E3/uL (ref 150–450)
RBC: 4.47 x10E6/uL (ref 3.77–5.28)
RDW: 13 % (ref 11.7–15.4)
WBC: 4.1 x10E3/uL (ref 3.4–10.8)

## 2024-03-20 LAB — NMR LIPOPROF WSUBCLS+GRAPH
Cholesterol, Total: 340 mg/dL — ABNORMAL HIGH (ref 100–199)
HDL Particle Number: 51.1 umol/L (ref >=30.5)
HDL Size: 8.8 nm — ABNORMAL LOW (ref >=9.2)
HDL-C: 62 mg/dL (ref >39)
LDL Particle Number: 2348 nmol/L — ABNORMAL HIGH (ref <1000)
LDL Size: 20.2 nm — ABNORMAL LOW (ref >20.5)
LDL Size: 20.2 nm — ABNORMAL LOW (ref >=20.8)
LDL-C (NIH Calc): 196 mg/dL — ABNORMAL HIGH (ref 0–99)
LP-IR Score: 64 — ABNORMAL HIGH (ref <=45)
Large HDL-P: 6 umol/L (ref >=4.8)
Large VLDL-P: 8.9 nmol/L — ABNORMAL HIGH (ref <=2.7)
Small LDL Particle Number: 1275 nmol/L — ABNORMAL HIGH (ref <=527)
Small LDL-P: 1275 nmol/L — ABNORMAL HIGH (ref <=527)
Triglycerides: 402 mg/dL — ABNORMAL HIGH (ref 0–149)
VLDL Size: 45.6 nm (ref <=46.6)

## 2024-03-20 LAB — B12 AND FOLATE PANEL
Folate: 7.5 ng/mL (ref >3.0)
Vitamin B-12: 412 pg/mL (ref 232–1245)

## 2024-03-20 LAB — RHEUMATOID ARTHRITIS PROFILE
Cyclic Citrullin Peptide Ab: 7 U (ref 0–19)
Rheumatoid fact SerPl-aCnc: 89.8 [IU]/mL — ABNORMAL HIGH (ref <14.0)

## 2024-03-20 LAB — CMP14+EGFR
ALT: 50 IU/L — ABNORMAL HIGH (ref 0–32)
AST: 30 IU/L (ref 0–40)
Albumin: 4.5 g/dL (ref 3.8–4.9)
Alkaline Phosphatase: 104 IU/L (ref 44–121)
BUN/Creatinine Ratio: 26 — ABNORMAL HIGH (ref 9–23)
BUN: 18 mg/dL (ref 6–24)
Bilirubin Total: 0.4 mg/dL (ref 0.0–1.2)
CO2: 20 mmol/L (ref 20–29)
Calcium: 9.6 mg/dL (ref 8.7–10.2)
Chloride: 101 mmol/L (ref 96–106)
Creatinine, Ser: 0.68 mg/dL (ref 0.57–1.00)
Globulin, Total: 2.5 g/dL (ref 1.5–4.5)
Glucose: 95 mg/dL (ref 70–99)
Potassium: 4.4 mmol/L (ref 3.5–5.2)
Sodium: 139 mmol/L (ref 134–144)
Total Protein: 7 g/dL (ref 6.0–8.5)
eGFR: 101 mL/min/1.73 (ref >59)

## 2024-03-20 LAB — VITAMIN D 25 HYDROXY (VIT D DEFICIENCY, FRACTURES): Vit D, 25-Hydroxy: 35 ng/mL (ref 30.0–100.0)

## 2024-03-20 LAB — TSH+FREE T4
Free T4: 1.09 ng/dL (ref 0.82–1.77)
TSH: 0.164 u[IU]/mL — ABNORMAL LOW (ref 0.450–4.500)

## 2024-03-20 LAB — LIPOPROTEIN A (LPA): Lipoprotein (a): 20.1 nmol/L (ref <75.0)

## 2024-03-20 LAB — ANA W/REFLEX IF POSITIVE: Anti Nuclear Antibody (ANA): NEGATIVE

## 2024-04-03 DIAGNOSIS — M545 Low back pain, unspecified: Secondary | ICD-10-CM | POA: Diagnosis not present

## 2024-04-03 DIAGNOSIS — M542 Cervicalgia: Secondary | ICD-10-CM | POA: Diagnosis not present

## 2024-04-10 DIAGNOSIS — C49A3 Gastrointestinal stromal tumor of small intestine: Secondary | ICD-10-CM | POA: Diagnosis not present

## 2024-04-10 DIAGNOSIS — K639 Disease of intestine, unspecified: Secondary | ICD-10-CM | POA: Diagnosis not present

## 2024-04-14 ENCOUNTER — Other Ambulatory Visit: Payer: Self-pay | Admitting: Nurse Practitioner

## 2024-04-14 DIAGNOSIS — J3089 Other allergic rhinitis: Secondary | ICD-10-CM

## 2024-05-02 ENCOUNTER — Ambulatory Visit: Admitting: Nurse Practitioner

## 2024-05-02 ENCOUNTER — Encounter: Payer: Self-pay | Admitting: Nurse Practitioner

## 2024-05-02 VITALS — BP 128/76 | HR 93 | Temp 98.4°F | Resp 16 | Ht 63.0 in | Wt 131.6 lb

## 2024-05-02 DIAGNOSIS — E781 Pure hyperglyceridemia: Secondary | ICD-10-CM

## 2024-05-02 DIAGNOSIS — E039 Hypothyroidism, unspecified: Secondary | ICD-10-CM

## 2024-05-02 DIAGNOSIS — R7989 Other specified abnormal findings of blood chemistry: Secondary | ICD-10-CM

## 2024-05-02 DIAGNOSIS — F411 Generalized anxiety disorder: Secondary | ICD-10-CM

## 2024-05-02 DIAGNOSIS — F988 Other specified behavioral and emotional disorders with onset usually occurring in childhood and adolescence: Secondary | ICD-10-CM

## 2024-05-02 DIAGNOSIS — M0580 Other rheumatoid arthritis with rheumatoid factor of unspecified site: Secondary | ICD-10-CM

## 2024-05-02 MED ORDER — AMPHETAMINE-DEXTROAMPHET ER 15 MG PO CP24: 15.0000 mg | ORAL_CAPSULE | ORAL | 0 refills | Status: DC

## 2024-05-02 MED ORDER — ALPRAZOLAM 0.5 MG PO TABS: 0.5000 mg | ORAL_TABLET | Freq: Every evening | ORAL | 2 refills | Status: DC | PRN

## 2024-05-02 MED ORDER — SYNTHROID 125 MCG PO TABS: 125.0000 ug | ORAL_TABLET | Freq: Every day | ORAL | 3 refills | Status: AC

## 2024-05-02 NOTE — Progress Notes (Signed)
 [facility] [address] [place], [place] [address]  Internal MEDICINE  Office Visit Note  Patient Name: Yesenia Ward  [id number]  [id number]  Date of Service: 05/02/2024  Chief Complaint  Patient presents with   Follow-up    HPI Yesenia Ward presents for a follow-up visit for lab results, ADHD and medication refills.  High cholesterol -- normal lipoprotein A level but very abnormal NMR lipid profile including significantly elevated LDL, LDL particle and triglycerides. The high level of these cholesterol values does direct concern for a familial cause.  Low vitamin D  is returned to normal range. ADHD -- heart rate and BP are normal. Denies any palpitations or other adverse side effects of the medication. She is due for refills but wants to increase the dose of adderall XR to 15 mg daily. RF is slightly more elevated than before, ANA remains negative. She does have arthritis in multiple joints and has flare ups periodically. Her previously xrays has not shown any signed of destruction of the joints yet. She does have a rheumatologist is is established with and can go see if she needs to.     Current Medication: Outpatient Encounter Medications as of 05/02/2024  Medication Sig   amphetamine -dextroamphetamine  (ADDERALL XR) 15 MG 24 hr capsule Take 1 capsule by mouth every morning.   [START ON 05/30/2024] amphetamine -dextroamphetamine  (ADDERALL XR) 15 MG 24 hr capsule Take 1 capsule by mouth every morning.   [START ON 06/27/2024] amphetamine -dextroamphetamine  (ADDERALL XR) 15 MG 24 hr capsule Take 1 capsule by mouth every morning.   SYNTHROID  125 MCG tablet Take 1 tablet (125 mcg total) by mouth daily before breakfast.   acetaminophen (TYLENOL) 500 MG tablet Take 500 mg by mouth every 6 (six) hours as needed.   ALLEGRA-D ALLERGY & CONGESTION 60-120 MG 12 hr tablet Take 1 tablet by mouth twice daily.   ALPRAZolam  (XANAX ) 0.5 MG tablet Take 1-2 tablets (0.5-1 mg total) by mouth at  bedtime as needed for anxiety. Take 1-2 tablets as needed at bedtime.   amphetamine -dextroamphetamine  (ADDERALL) 10 MG tablet Take 1 tablet (10 mg total) by mouth daily as needed (ADHD symptoms).   meloxicam (MOBIC) 15 MG tablet Take 15 mg by mouth daily.   methocarbamol (ROBAXIN) 750 MG tablet Take 750 mg by mouth 2 (two) times daily.   ondansetron  (ZOFRAN ) 4 MG tablet TAKE 1 TABLET(4 MG) BY MOUTH EVERY 8 HOURS AS NEEDED FOR NAUSEA OR VOMITING   Oxymetazoline HCl (UPNEEQ) 0.1 % SOLN Apply to eye.   valACYclovir  (VALTREX ) 1000 MG tablet Take 1 tablet (1,000 mg total) by mouth 2 (two) times daily. During outbreak/flare until resolved   [DISCONTINUED] ALPRAZolam  (XANAX ) 0.5 MG tablet Take 1-2 tablets (0.5-1 mg total) by mouth at bedtime as needed for anxiety. Take 1-2 tablets as needed at bedtime.   [DISCONTINUED] amphetamine -dextroamphetamine  (ADDERALL XR) 10 MG 24 hr capsule Take 1 capsule (10 mg total) by mouth daily.   [DISCONTINUED] amphetamine -dextroamphetamine  (ADDERALL XR) 10 MG 24 hr capsule Take 1 capsule (10 mg total) by mouth daily.   [DISCONTINUED] amphetamine -dextroamphetamine  (ADDERALL XR) 10 MG 24 hr capsule Take 1 capsule (10 mg total) by mouth daily.   [DISCONTINUED] levothyroxine  (SYNTHROID ) 125 MCG tablet Take 1 tablet (125 mcg total) by mouth daily before breakfast.   No facility-administered encounter medications on file as of 05/02/2024.    Surgical History: Past Surgical History:  Procedure Laterality Date   AUGMENTATION MAMMAPLASTY Bilateral 2005   BREAST BIOPSY Right 10/18/2022   Us  Core Bx,  Coil Clip - path pending   BREAST BIOPSY Right 10/18/2022   US  RT BREAST BX W LOC DEV 1ST LESION IMG BX SPEC US  GUIDE 10/18/2022 [facility]   gist tumor remooval     OVARIAN CYST REMOVAL Left    RESECTION SMALL BOWEL / CLOSURE ILEOSTOMY      Medical History: Past Medical History:  Diagnosis Date   GIST (gastrointestinal stromal tumor), non-malignant    Hypothyroid     Tumor     Family History: Family History  Problem Relation Age of Onset   Diabetes Mother    Coronary artery disease Mother    Cirrhosis Father    Cancer Maternal Uncle    Rectal cancer Paternal Aunt    Cancer Paternal Aunt     Social History   Socioeconomic History   Marital status: Married    Spouse name: Not on file   Number of children: Not on file   Years of education: Not on file   Highest education level: Not on file  Occupational History   Not on file  Tobacco Use   Smoking status: Former   Smokeless tobacco: Never  Vaping Use   Vaping status: Never Used  Substance and Sexual Activity   Alcohol use: Yes    Comment: occasionally   Drug use: Never   Sexual activity: Not on file  Other Topics Concern   Not on file  Social History Narrative   Not on file   Social Drivers of Health   Financial Resource Strain: Low Risk  (02/08/2024)   Received from [facility]   Overall Financial Resource Strain (CARDIA)    Difficulty of Paying Living Expenses: Not hard at all  Food Insecurity: No Food Insecurity (02/08/2024)   [facility] from [facility]   Hunger Vital Sign    Within the past 12 months, you worried that your food would run out before you got the money to buy more.: Never true    Within the past 12 months, the food you bought just didn't last and you didn't have money to get more.: Never true  Transportation Needs: No Transportation Needs (02/08/2024)   Received from [facility] - Transportation    In the past 12 months, has lack of transportation kept you from medical appointments or from getting medications?: No    Lack of Transportation (Non-Medical): No  Physical Activity: Not on file  Stress: Not on file  Social Connections: Not on file  Intimate Partner Violence: Not on file      Review of Systems  Constitutional:  Negative for chills, fatigue and unexpected weight change.  HENT:   Negative for congestion, rhinorrhea, sneezing and sore throat.   Eyes:  Negative for redness.  Respiratory:  Negative for cough, chest tightness and shortness of breath.   Cardiovascular:  Negative for chest pain and palpitations.  Gastrointestinal:  Negative for abdominal pain, constipation, diarrhea, nausea and vomiting.  Genitourinary:  Negative for dysuria and frequency.  Musculoskeletal:  Negative for arthralgias, back pain, joint swelling and neck pain.  Skin:  Negative for rash.  Neurological: Negative.  Negative for tremors and numbness.  Hematological:  Negative for adenopathy. Does not bruise/bleed easily.  Psychiatric/Behavioral:  Negative for behavioral problems (Depression), sleep disturbance and suicidal ideas. The patient is not nervous/anxious.     Vital Signs: BP 128/76   Pulse 93   Temp 98.4 F (36.9 C)   Resp 16   Ht  5' 3 (1.6 m)   Wt 131 lb 9.6 oz (59.7 kg)   SpO2 98%   BMI 23.31 kg/m    Physical Exam Vitals reviewed.  Constitutional:      General: She is not in acute distress.    Appearance: Normal appearance. She is normal weight. She is not ill-appearing.  HENT:     Head: Normocephalic and atraumatic.  Eyes:     Pupils: Pupils are equal, round, and reactive to light.  Cardiovascular:     Rate and Rhythm: Normal rate and regular rhythm.  Pulmonary:     Effort: Pulmonary effort is normal. No respiratory distress.  Neurological:     Mental Status: She is alert and oriented to person, place, and time.     Cranial Nerves: No cranial nerve deficit.     Coordination: Coordination normal.     Gait: Gait normal.  Psychiatric:        Mood and Affect: Mood normal.        Behavior: Behavior normal.        Assessment/Plan: 1. Hypertriglyceridemia (Primary) Referred to cardiology  - Ambulatory referral to Cardiology  2. High serum low density lipoprotein (LDL) cholesterol Referred to cardiology  - Ambulatory referral to Cardiology  3. Acquired  hypothyroidism Continue synthroid  as prescribed.  - SYNTHROID  125 MCG tablet; Take 1 tablet (125 mcg total) by mouth daily before breakfast.  Dispense: 90 tablet; Refill: 3  4. Polyarthritis with positive rheumatoid factor (HCC) Continue to monitor and patient is considering seeing rheumatology.   5. Generalized anxiety disorder Continue alprazolam  as prescribed.  - ALPRAZolam  (XANAX ) 0.5 MG tablet; Take 1-2 tablets (0.5-1 mg total) by mouth at bedtime as needed for anxiety. Take 1-2 tablets as needed at bedtime.  Dispense: 60 tablet; Refill: 2  6. Adult attention deficit disorder Continue adderall as prescribed, follow up in 3 months for additional refills.  - amphetamine -dextroamphetamine  (ADDERALL XR) 15 MG 24 hr capsule; Take 1 capsule by mouth every morning.  Dispense: 30 capsule; Refill: 0 - amphetamine -dextroamphetamine  (ADDERALL XR) 15 MG 24 hr capsule; Take 1 capsule by mouth every morning.  Dispense: 30 capsule; Refill: 0 - amphetamine -dextroamphetamine  (ADDERALL XR) 15 MG 24 hr capsule; Take 1 capsule by mouth every morning.  Dispense: 30 capsule; Refill: 0   General Counseling: Yesenia Ward verbalizes understanding of the findings of todays visit and agrees with plan of treatment. I have discussed any further diagnostic evaluation that may be needed or ordered today. We also reviewed her medications today. she has been encouraged to call the office with any questions or concerns that should arise related to todays visit.    Orders Placed This Encounter  Procedures   Ambulatory referral to Cardiology    Meds ordered this encounter  Medications   SYNTHROID  125 MCG tablet    Sig: Take 1 tablet (125 mcg total) by mouth daily before breakfast.    Dispense:  90 tablet    Refill:  3    Fill script today, do not run through insurance, patient will pay out of pocket.   ALPRAZolam  (XANAX ) 0.5 MG tablet    Sig: Take 1-2 tablets (0.5-1 mg total) by mouth at bedtime as needed for anxiety.  Take 1-2 tablets as needed at bedtime.    Dispense:  60 tablet    Refill:  2   amphetamine -dextroamphetamine  (ADDERALL XR) 15 MG 24 hr capsule    Sig: Take 1 capsule by mouth every morning.    Dispense:  30 capsule  Refill:  0    Note increased dose, fill script for September.   amphetamine -dextroamphetamine  (ADDERALL XR) 15 MG 24 hr capsule    Sig: Take 1 capsule by mouth every morning.    Dispense:  30 capsule    Refill:  0    Note increased dose, fill script for October 2   amphetamine -dextroamphetamine  (ADDERALL XR) 15 MG 24 hr capsule    Sig: Take 1 capsule by mouth every morning.    Dispense:  30 capsule    Refill:  0    Note increased dose, fill script for October 30    Return for previously scheduled, CPE, Karma Ansley PCP in mid december, may need adhd refill prior to visit.   Total time spent:30 Minutes Time spent includes review of chart, medications, test results, and follow up plan with the patient.   [place] Controlled Substance Database was reviewed by me.  This patient was seen by Mardy Maxin, FNP-C in collaboration with Dr. Sigrid Bathe as a part of collaborative care agreement.   Abdulrahim Siddiqi R. Maxin, MSN, FNP-C Internal medicine

## 2024-05-03 ENCOUNTER — Telehealth: Payer: Self-pay | Admitting: Nurse Practitioner

## 2024-05-03 NOTE — Telephone Encounter (Signed)
 Awaiting 05/02/24 office notes for Cardiology referral-Toni

## 2024-05-06 ENCOUNTER — Telehealth: Payer: Self-pay | Admitting: Nurse Practitioner

## 2024-05-06 ENCOUNTER — Encounter: Payer: Self-pay | Admitting: Nurse Practitioner

## 2024-05-06 NOTE — Telephone Encounter (Signed)
 Cardiology referral sent via [employer] to Dr. Ammon w/ [facility]. Notified patient. Gave telephone # [phone]

## 2024-05-13 ENCOUNTER — Telehealth: Payer: Self-pay | Admitting: Nurse Practitioner

## 2024-05-13 NOTE — Telephone Encounter (Signed)
 Cardiovascular appointment 05/21/2024 with [facility] -[place]

## 2024-05-21 DIAGNOSIS — E7800 Pure hypercholesterolemia, unspecified: Secondary | ICD-10-CM | POA: Diagnosis not present

## 2024-05-21 DIAGNOSIS — R079 Chest pain, unspecified: Secondary | ICD-10-CM | POA: Diagnosis not present

## 2024-06-18 ENCOUNTER — Encounter: Payer: Self-pay | Admitting: Nurse Practitioner

## 2024-06-18 ENCOUNTER — Ambulatory Visit: Admitting: Nurse Practitioner

## 2024-06-18 VITALS — BP 130/86 | HR 95 | Temp 97.8°F | Resp 16 | Ht 63.0 in | Wt 128.8 lb

## 2024-06-18 DIAGNOSIS — G4709 Other insomnia: Secondary | ICD-10-CM

## 2024-06-18 DIAGNOSIS — Z630 Problems in relationship with spouse or partner: Secondary | ICD-10-CM

## 2024-06-18 DIAGNOSIS — F418 Other specified anxiety disorders: Secondary | ICD-10-CM

## 2024-06-18 DIAGNOSIS — F321 Major depressive disorder, single episode, moderate: Secondary | ICD-10-CM | POA: Diagnosis not present

## 2024-06-18 MED ORDER — ZALEPLON 5 MG PO CAPS: 5.0000 mg | ORAL_CAPSULE | Freq: Every evening | ORAL | 2 refills | Status: AC | PRN

## 2024-06-18 MED ORDER — FLUOXETINE HCL 40 MG PO CAPS: 40.0000 mg | ORAL_CAPSULE | Freq: Every day | ORAL | 3 refills | Status: AC

## 2024-06-18 NOTE — Progress Notes (Signed)
 [facility] [address] [place], [place] [address]  Internal MEDICINE  Office Visit Note  Patient Name: Yesenia Ward  [id number]  [id number]  Date of Service: 06/18/2024  Chief Complaint  Patient presents with   Acute Visit    Nervous breakdown--- divorce      HPI Yesenia Ward presents for an acute sick visit for anxiety, depression and relationship issues.  --increased anxiety and depression made worse by increased stress due to relationship issues with her spouse.  Working through a divorce with her husband still living in the house.  Has appt with therapist tomorrow.  Wants to request FMLA.      Current Medication:  Outpatient Encounter Medications as of 06/18/2024  Medication Sig   FLUoxetine  (PROZAC ) 40 MG capsule Take 1 capsule (40 mg total) by mouth daily.   zaleplon (SONATA) 5 MG capsule Take 1 capsule (5 mg total) by mouth at bedtime as needed for sleep.   acetaminophen (TYLENOL) 500 MG tablet Take 500 mg by mouth every 6 (six) hours as needed.   ALLEGRA-D ALLERGY & CONGESTION 60-120 MG 12 hr tablet Take 1 tablet by mouth twice daily.   ALPRAZolam  (XANAX ) 0.5 MG tablet Take 1-2 tablets (0.5-1 mg total) by mouth at bedtime as needed for anxiety. Take 1-2 tablets as needed at bedtime.   amphetamine -dextroamphetamine  (ADDERALL XR) 15 MG 24 hr capsule Take 1 capsule by mouth every morning.   amphetamine -dextroamphetamine  (ADDERALL XR) 15 MG 24 hr capsule Take 1 capsule by mouth every morning.   [START ON 06/27/2024] amphetamine -dextroamphetamine  (ADDERALL XR) 15 MG 24 hr capsule Take 1 capsule by mouth every morning.   amphetamine -dextroamphetamine  (ADDERALL) 10 MG tablet Take 1 tablet (10 mg total) by mouth daily as needed (ADHD symptoms).   meloxicam (MOBIC) 15 MG tablet Take 15 mg by mouth daily.   methocarbamol (ROBAXIN) 750 MG tablet Take 750 mg by mouth 2 (two) times daily.   ondansetron  (ZOFRAN ) 4 MG tablet TAKE 1 TABLET(4 MG) BY MOUTH EVERY 8 HOURS AS  NEEDED FOR NAUSEA OR VOMITING   Oxymetazoline HCl (UPNEEQ) 0.1 % SOLN Apply to eye.   SYNTHROID  125 MCG tablet Take 1 tablet (125 mcg total) by mouth daily before breakfast.   valACYclovir  (VALTREX ) 1000 MG tablet Take 1 tablet (1,000 mg total) by mouth 2 (two) times daily. During outbreak/flare until resolved   No facility-administered encounter medications on file as of 06/18/2024.      Medical History: Past Medical History:  Diagnosis Date   GIST (gastrointestinal stromal tumor), non-malignant    Hypothyroid    Tumor      Vital Signs: BP 130/86   Pulse 95   Temp 97.8 F (36.6 C)   Resp 16   Ht 5' 3 (1.6 m)   Wt 128 lb 12.8 oz (58.4 kg)   SpO2 97%   BMI 22.82 kg/m    Review of Systems  Constitutional:  Positive for appetite change and fatigue. Negative for chills and unexpected weight change.  HENT: Negative.  Negative for congestion, postnasal drip, rhinorrhea, sneezing and sore throat.   Eyes:  Negative for redness.  Respiratory:  Negative for cough, chest tightness and shortness of breath.   Cardiovascular:  Negative for chest pain and palpitations.  Gastrointestinal:  Negative for abdominal pain, constipation, diarrhea, nausea and vomiting.  Genitourinary:  Negative for dysuria and frequency.  Musculoskeletal:  Negative for arthralgias, back pain, joint swelling and neck pain.  Skin:  Negative for rash.  Neurological: Negative.  Negative  for tremors and numbness.  Hematological:  Negative for adenopathy. Does not bruise/bleed easily.  Psychiatric/Behavioral:  Positive for behavioral problems (Depression), decreased concentration and sleep disturbance. Negative for self-injury and suicidal ideas. The patient is nervous/anxious.     Physical Exam Vitals reviewed.  Constitutional:      General: She is not in acute distress.    Appearance: Normal appearance. She is normal weight. She is not ill-appearing.  HENT:     Head: Normocephalic and atraumatic.  Eyes:      Pupils: Pupils are equal, round, and reactive to light.  Cardiovascular:     Rate and Rhythm: Normal rate and regular rhythm.  Pulmonary:     Effort: Pulmonary effort is normal. No respiratory distress.  Neurological:     Mental Status: She is alert and oriented to person, place, and time.  Psychiatric:        Attention and Perception: She is inattentive.        Mood and Affect: Mood is anxious and depressed. Affect is tearful.        Speech: Speech is delayed.        Behavior: Behavior is slowed and withdrawn. Behavior is cooperative.        Thought Content: Thought content is not paranoid or delusional. Thought content does not include homicidal or suicidal ideation.       Assessment/Plan: 1. Other insomnia (Primary) Will try zaleplon to help with sleep.  - zaleplon (SONATA) 5 MG capsule; Take 1 capsule (5 mg total) by mouth at bedtime as needed for sleep.  Dispense: 30 capsule; Refill: 2  2. Marital relationship problem Referred to psychiatry - Ambulatory referral to Psychiatry  3. Depression, major, single episode, moderate (HCC) Referred to psychiatry  - Ambulatory referral to Psychiatry - FLUoxetine  (PROZAC ) 40 MG capsule; Take 1 capsule (40 mg total) by mouth daily.  Dispense: 30 capsule; Refill: 3  4. Situational anxiety Referred to psychiatry  - Ambulatory referral to Psychiatry - FLUoxetine  (PROZAC ) 40 MG capsule; Take 1 capsule (40 mg total) by mouth daily.  Dispense: 30 capsule; Refill: 3   General Counseling: Yesenia Ward verbalizes understanding of the findings of todays visit and agrees with plan of treatment. I have discussed any further diagnostic evaluation that may be needed or ordered today. We also reviewed her medications today. she has been encouraged to call the office with any questions or concerns that should arise related to todays visit.    Counseling:    Orders Placed This Encounter  Procedures   Ambulatory referral to Psychiatry    Meds  ordered this encounter  Medications   FLUoxetine  (PROZAC ) 40 MG capsule    Sig: Take 1 capsule (40 mg total) by mouth daily.    Dispense:  30 capsule    Refill:  3    Fill new script today, note increased dose, discontinue 20 mg dose.   zaleplon (SONATA) 5 MG capsule    Sig: Take 1 capsule (5 mg total) by mouth at bedtime as needed for sleep.    Dispense:  30 capsule    Refill:  2    Fill new script today. Patient knows not to take with alprazolam .    Return in about 4 weeks (around 07/16/2024) for F/U, eval new med, anxiety med refill, Jeananne Bedwell PCP.  [place] Controlled Substance Database was reviewed by me for overdose risk score (ORS)  Time spent:30 Minutes Time spent with patient included reviewing progress notes, labs, imaging studies, and discussing plan for  follow up.   This patient was seen by Mardy Maxin, FNP-C in collaboration with Dr. Sigrid Bathe as a part of collaborative care agreement.  Carmaleta Youngers R. Maxin, MSN, FNP-C Internal Medicine

## 2024-06-26 DIAGNOSIS — F4323 Adjustment disorder with mixed anxiety and depressed mood: Secondary | ICD-10-CM | POA: Diagnosis not present

## 2024-06-27 ENCOUNTER — Telehealth: Payer: Self-pay | Admitting: Nurse Practitioner

## 2024-06-27 NOTE — Telephone Encounter (Signed)
 FMLA completed. Faxed back to [employer]; [phone]. Scanned. Lvm notifying patient. @ front desk for her to p/u-Toni

## 2024-07-03 DIAGNOSIS — F4323 Adjustment disorder with mixed anxiety and depressed mood: Secondary | ICD-10-CM | POA: Diagnosis not present

## 2024-07-13 ENCOUNTER — Encounter: Payer: Self-pay | Admitting: Nurse Practitioner

## 2024-07-13 DIAGNOSIS — F321 Major depressive disorder, single episode, moderate: Secondary | ICD-10-CM | POA: Insufficient documentation

## 2024-07-13 DIAGNOSIS — F418 Other specified anxiety disorders: Secondary | ICD-10-CM | POA: Insufficient documentation

## 2024-07-15 ENCOUNTER — Ambulatory Visit: Admitting: Nurse Practitioner

## 2024-07-30 ENCOUNTER — Other Ambulatory Visit: Payer: Self-pay

## 2024-08-02 ENCOUNTER — Other Ambulatory Visit: Payer: Self-pay | Admitting: Nurse Practitioner

## 2024-08-02 DIAGNOSIS — F988 Other specified behavioral and emotional disorders with onset usually occurring in childhood and adolescence: Secondary | ICD-10-CM

## 2024-08-02 DIAGNOSIS — F4323 Adjustment disorder with mixed anxiety and depressed mood: Secondary | ICD-10-CM | POA: Diagnosis not present

## 2024-08-13 ENCOUNTER — Telehealth: Payer: Self-pay | Admitting: Nurse Practitioner

## 2024-08-13 NOTE — Telephone Encounter (Signed)
 Lvm to move 08/15/24 appointment-Toni

## 2024-08-15 ENCOUNTER — Encounter: Payer: BC Managed Care – PPO | Admitting: Nurse Practitioner

## 2024-08-15 ENCOUNTER — Encounter: Admitting: Internal Medicine

## 2024-08-16 ENCOUNTER — Encounter: Admitting: Nurse Practitioner

## 2024-08-19 DIAGNOSIS — E785 Hyperlipidemia, unspecified: Secondary | ICD-10-CM | POA: Diagnosis not present

## 2024-08-26 DIAGNOSIS — F4323 Adjustment disorder with mixed anxiety and depressed mood: Secondary | ICD-10-CM | POA: Diagnosis not present

## 2024-09-04 ENCOUNTER — Encounter: Payer: Self-pay | Admitting: Nurse Practitioner

## 2024-09-04 ENCOUNTER — Ambulatory Visit (INDEPENDENT_AMBULATORY_CARE_PROVIDER_SITE_OTHER): Admitting: Nurse Practitioner

## 2024-09-04 VITALS — BP 136/88 | HR 98 | Temp 98.1°F | Resp 16 | Ht 63.0 in | Wt 127.4 lb

## 2024-09-04 DIAGNOSIS — E538 Deficiency of other specified B group vitamins: Secondary | ICD-10-CM

## 2024-09-04 DIAGNOSIS — R7303 Prediabetes: Secondary | ICD-10-CM

## 2024-09-04 DIAGNOSIS — F411 Generalized anxiety disorder: Secondary | ICD-10-CM | POA: Diagnosis not present

## 2024-09-04 DIAGNOSIS — E039 Hypothyroidism, unspecified: Secondary | ICD-10-CM | POA: Diagnosis not present

## 2024-09-04 DIAGNOSIS — R300 Dysuria: Secondary | ICD-10-CM | POA: Diagnosis not present

## 2024-09-04 DIAGNOSIS — E559 Vitamin D deficiency, unspecified: Secondary | ICD-10-CM

## 2024-09-04 DIAGNOSIS — Z1231 Encounter for screening mammogram for malignant neoplasm of breast: Secondary | ICD-10-CM

## 2024-09-04 DIAGNOSIS — R748 Abnormal levels of other serum enzymes: Secondary | ICD-10-CM

## 2024-09-04 DIAGNOSIS — F988 Other specified behavioral and emotional disorders with onset usually occurring in childhood and adolescence: Secondary | ICD-10-CM | POA: Diagnosis not present

## 2024-09-04 DIAGNOSIS — Z0001 Encounter for general adult medical examination with abnormal findings: Secondary | ICD-10-CM | POA: Diagnosis not present

## 2024-09-04 MED ORDER — METFORMIN HCL ER 750 MG PO TB24: 750.0000 mg | ORAL_TABLET | Freq: Every day | ORAL | 3 refills | Status: AC

## 2024-09-04 MED ORDER — AMPHETAMINE-DEXTROAMPHET ER 15 MG PO CP24: 15.0000 mg | ORAL_CAPSULE | ORAL | 0 refills | Status: AC

## 2024-09-04 MED ORDER — AMPHETAMINE-DEXTROAMPHETAMINE 10 MG PO TABS: 10.0000 mg | ORAL_TABLET | Freq: Every day | ORAL | 0 refills | Status: AC | PRN

## 2024-09-04 MED ORDER — ALPRAZOLAM 0.5 MG PO TABS: 0.5000 mg | ORAL_TABLET | Freq: Every evening | ORAL | 2 refills | Status: AC | PRN

## 2024-09-04 NOTE — Progress Notes (Signed)
 [facility] [address] [place], [place] [address]  Internal MEDICINE  Office Visit Note  Patient Name: Yesenia Ward  [id number]  [id number]  Date of Service: 09/04/2024  Chief Complaint  Patient presents with   Annual Exam    HPI Janila presents for an annual well visit and physical exam.  Well-appearing 59 y.o. female with hypothyroidism, GIST tumor s/p surgical removal, GERD, insomnia, iron deficiency anemia, GAD, low vitamin D  and ADD.  Routine CRC screening: due in 2029 Routine mammogram: due now  DEXA scan: not due yet Pap smear: goes to OBGYN, Dr. Verdon at [facility] clinic for women[facility]  Labs: due for routine labs  New or worsening pain: none  Other concerns: Fluoxetine  40 mg is working well, anxiety and panic has imrpoved  Going to counseling with husband.  Went to cardiologist -- will have repeat labs. Encouraged to stay on Synthroid  brand name and metformin .   Current Medication: Outpatient Encounter Medications as of 09/04/2024  Medication Sig   metFORMIN  (GLUCOPHAGE -XR) 750 MG 24 hr tablet Take 1 tablet (750 mg total) by mouth daily with breakfast.   acetaminophen (TYLENOL) 500 MG tablet Take 500 mg by mouth every 6 (six) hours as needed.   ALLEGRA-D ALLERGY & CONGESTION 60-120 MG 12 hr tablet Take 1 tablet by mouth twice daily.   ALPRAZolam  (XANAX ) 0.5 MG tablet Take 1-2 tablets (0.5-1 mg total) by mouth at bedtime as needed for anxiety. Take 1-2 tablets as needed at bedtime.   [START ON 10/30/2024] amphetamine -dextroamphetamine  (ADDERALL XR) 15 MG 24 hr capsule Take 1 capsule by mouth every morning.   [START ON 10/02/2024] amphetamine -dextroamphetamine  (ADDERALL XR) 15 MG 24 hr capsule Take 1 capsule by mouth every morning.   amphetamine -dextroamphetamine  (ADDERALL XR) 15 MG 24 hr capsule Take 1 capsule by mouth every morning.   amphetamine -dextroamphetamine  (ADDERALL) 10 MG tablet Take 1 tablet (10 mg total) by mouth daily as needed (ADHD  symptoms).   FLUoxetine  (PROZAC ) 40 MG capsule Take 1 capsule (40 mg total) by mouth daily.   meloxicam (MOBIC) 15 MG tablet Take 15 mg by mouth daily.   methocarbamol (ROBAXIN) 750 MG tablet Take 750 mg by mouth 2 (two) times daily.   ondansetron  (ZOFRAN ) 4 MG tablet TAKE 1 TABLET(4 MG) BY MOUTH EVERY 8 HOURS AS NEEDED FOR NAUSEA OR VOMITING   Oxymetazoline HCl (UPNEEQ) 0.1 % SOLN Apply to eye.   SYNTHROID  125 MCG tablet Take 1 tablet (125 mcg total) by mouth daily before breakfast.   valACYclovir  (VALTREX ) 1000 MG tablet Take 1 tablet (1,000 mg total) by mouth 2 (two) times daily. During outbreak/flare until resolved   zaleplon  (SONATA ) 5 MG capsule Take 1 capsule (5 mg total) by mouth at bedtime as needed for sleep.   [DISCONTINUED] ALPRAZolam  (XANAX ) 0.5 MG tablet Take 1-2 tablets (0.5-1 mg total) by mouth at bedtime as needed for anxiety. Take 1-2 tablets as needed at bedtime.   [DISCONTINUED] amphetamine -dextroamphetamine  (ADDERALL XR) 15 MG 24 hr capsule Take 1 capsule by mouth every morning.   [DISCONTINUED] amphetamine -dextroamphetamine  (ADDERALL XR) 15 MG 24 hr capsule Take 1 capsule by mouth every morning.   [DISCONTINUED] amphetamine -dextroamphetamine  (ADDERALL XR) 15 MG 24 hr capsule Take 1 capsule by mouth every morning.   [DISCONTINUED] amphetamine -dextroamphetamine  (ADDERALL) 10 MG tablet Take 1 tablet (10 mg total) by mouth daily as needed (ADHD symptoms).   No facility-administered encounter medications on file as of 09/04/2024.    Surgical History: Past Surgical History:  Procedure Laterality  Date   AUGMENTATION MAMMAPLASTY Bilateral 2005   BREAST BIOPSY Right 10/18/2022   Us  Core Bx, Coil Clip - path pending   BREAST BIOPSY Right 10/18/2022   US  RT BREAST BX W LOC DEV 1ST LESION IMG BX SPEC US  GUIDE 10/18/2022 [facility]   gist tumor remooval     OVARIAN CYST REMOVAL Left    RESECTION SMALL BOWEL / CLOSURE ILEOSTOMY      Medical History: Past Medical History:   Diagnosis Date   GIST (gastrointestinal stromal tumor), non-malignant    Hypothyroid    Tumor     Family History: Family History  Problem Relation Age of Onset   Diabetes Mother    Coronary artery disease Mother    Cirrhosis Father    Cancer Maternal Uncle    Rectal cancer Paternal Aunt    Cancer Paternal Aunt     Social History   Socioeconomic History   Marital status: Married    Spouse name: Not on file   Number of children: Not on file   Years of education: Not on file   Highest education level: Not on file  Occupational History   Not on file  Tobacco Use   Smoking status: Former   Smokeless tobacco: Never  Vaping Use   Vaping status: Never Used  Substance and Sexual Activity   Alcohol use: Yes    Comment: occasionally   Drug use: Never   Sexual activity: Not on file  Other Topics Concern   Not on file  Social History Narrative   Not on file   Social Drivers of Health   Tobacco Use: Medium Risk (09/04/2024)   Patient History    Smoking Tobacco Use: Former    Smokeless Tobacco Use: Never    Passive Exposure: Not on Actuary Strain: Low Risk  (05/21/2024)   Received from [facility]   Overall Financial Resource Strain (CARDIA)    Difficulty of Paying Living Expenses: Not hard at all  Food Insecurity: No Food Insecurity (05/21/2024)   Received from [facility]   Epic    Within the past 12 months, you worried that your food would run out before you got the money to buy more.: Never true    Within the past 12 months, the food you bought just didn't last and you didn't have money to get more.: Never true  Transportation Needs: No Transportation Needs (05/21/2024)   Received from [facility] - Transportation    In the past 12 months, has lack of transportation kept you from medical appointments or from getting medications?: No    Lack of Transportation (Non-Medical): No  Physical  Activity: Not on file  Stress: Not on file  Social Connections: Not on file  Intimate Partner Violence: Not on file  Depression (PHQ2-9): Low Risk (09/04/2024)   Depression (PHQ2-9)    PHQ-2 Score: 0  Alcohol Screen: Low Risk (05/16/2022)   Alcohol Screen    Last Alcohol Screening Score (AUDIT): 2  Housing: Low Risk  (05/21/2024)   Received from [facility]   Epic    In the last 12 months, was there a time when you were not able to pay the mortgage or rent on time?: No    In the past 12 months, how many times have you moved where you were living?: 0    At any time in the past 12 months, were you homeless or living  in a shelter (including now)?: No  Utilities: Not At Risk (05/21/2024)   Received from [facility]   Epic    In the past 12 months has the electric, gas, oil, or water company threatened to shut off services in your home?: No  Health Literacy: Not on file      Review of Systems  Constitutional:  Positive for appetite change and fatigue. Negative for activity change, chills, fever and unexpected weight change.  HENT: Negative.  Negative for congestion, ear pain, postnasal drip, rhinorrhea, sneezing, sore throat and trouble swallowing.   Eyes: Negative.  Negative for redness.  Respiratory: Negative.  Negative for cough, chest tightness, shortness of breath and wheezing.   Cardiovascular: Negative.  Negative for chest pain and palpitations.  Gastrointestinal:  Negative for abdominal pain, blood in stool, constipation, diarrhea, nausea and vomiting.       GIST tumor removed and is on Gleevec, gets yearly CT abdomen to assess for new tumors or mets.   Endocrine: Negative.   Genitourinary: Negative.  Negative for difficulty urinating, dysuria, frequency, hematuria and urgency.  Musculoskeletal:  Negative for arthralgias, back pain, joint swelling, myalgias and neck pain.  Skin: Negative.  Negative for rash and wound.  Allergic/Immunologic:  Negative.  Negative for immunocompromised state.  Neurological: Negative.  Negative for dizziness, tremors, seizures, light-headedness, numbness and headaches.  Hematological: Negative.  Negative for adenopathy. Does not bruise/bleed easily.  Psychiatric/Behavioral:  Positive for behavioral problems (Depression), decreased concentration and sleep disturbance. Negative for self-injury and suicidal ideas. The patient is nervous/anxious.     Vital Signs: BP (!) 136/98   Pulse 98   Temp 98.1 F (36.7 C)   Resp 16   Ht 5' 3 (1.6 m)   Wt 127 lb 6.4 oz (57.8 kg) Comment: at home  SpO2 98%   BMI 22.57 kg/m    Physical Exam Vitals reviewed.  Constitutional:      General: She is awake. She is not in acute distress.    Appearance: Normal appearance. She is well-developed, well-groomed and normal weight. She is not ill-appearing or diaphoretic.  HENT:     Head: Normocephalic and atraumatic.     Right Ear: Tympanic membrane, ear canal and external ear normal.     Left Ear: Tympanic membrane, ear canal and external ear normal.     Nose: Nose normal. No congestion or rhinorrhea.     Mouth/Throat:     Lips: Pink.     Mouth: Mucous membranes are moist.     Pharynx: Oropharynx is clear. Uvula midline. No oropharyngeal exudate or posterior oropharyngeal erythema.  Eyes:     General: Lids are normal. Vision grossly intact. Gaze aligned appropriately. No scleral icterus.       Right eye: No discharge.        Left eye: No discharge.     Extraocular Movements: Extraocular movements intact.     Conjunctiva/sclera: Conjunctivae normal.     Pupils: Pupils are equal, round, and reactive to light.     Funduscopic exam:    Right eye: Red reflex present.        Left eye: Red reflex present. Neck:     Thyroid : No thyromegaly.     Vascular: No JVD.     Trachea: Trachea and phonation normal. No tracheal deviation.  Cardiovascular:     Rate and Rhythm: Normal rate and regular rhythm.     Pulses:           Carotid  pulses are 3+ on the right side and 3+ on the left side.      Radial pulses are 2+ on the right side and 2+ on the left side.       Posterior tibial pulses are 1+ on the right side and 1+ on the left side.     Heart sounds: Normal heart sounds, S1 normal and S2 normal. No murmur heard.    No friction rub. No gallop.  Pulmonary:     Effort: Pulmonary effort is normal. No accessory muscle usage or respiratory distress.     Breath sounds: Normal breath sounds and air entry. No stridor. No wheezing or rales.  Chest:     Chest wall: No tenderness.     Comments: Declined clinical breast exam, will schedule mammogram.  Abdominal:     General: Bowel sounds are normal. There is no distension.     Palpations: Abdomen is soft. There is no mass.     Tenderness: There is no abdominal tenderness. There is no guarding or rebound.  Musculoskeletal:        General: No tenderness or deformity. Normal range of motion.     Cervical back: Normal range of motion and neck supple.     Right lower leg: No edema.     Left lower leg: No edema.  Lymphadenopathy:     Cervical: No cervical adenopathy.  Skin:    General: Skin is warm and dry.     Capillary Refill: Capillary refill takes less than 2 seconds.     Coloration: Skin is not pale.     Findings: No erythema or rash.  Neurological:     Mental Status: She is alert and oriented to person, place, and time.     Cranial Nerves: No cranial nerve deficit.     Motor: No abnormal muscle tone.     Coordination: Coordination normal.     Gait: Gait normal.     Deep Tendon Reflexes: Reflexes are normal and symmetric.  Psychiatric:        Attention and Perception: She is inattentive.        Mood and Affect: Mood is anxious and depressed. Affect is tearful.        Speech: Speech is delayed.        Behavior: Behavior is slowed and withdrawn. Behavior is cooperative.        Thought Content: Thought content normal. Thought content is not paranoid or  delusional. Thought content does not include homicidal or suicidal ideation.        Judgment: Judgment normal.        Assessment/Plan: 1. Encounter for routine adult health examination with abnormal findings Age-appropriate preventive screenings and vaccinations discussed, annual physical exam completed. Routine labs for health maintenance ordered, see below. PHM updated.   - TSH + free T4 - Hgb A1C w/o eAG - B12 and Folate Panel - Vitamin D  (25 hydroxy) - Iron, TIBC and Ferritin Panel - CBC with Differential/Platelet - Hepatic function panel  2. Acquired hypothyroidism Routine labs ordered  - TSH + free T4 - Iron, TIBC and Ferritin Panel - CBC with Differential/Platelet  3. Prediabetes Metformin  dose increased. Routine lab ordered  - Hgb A1C w/o eAG - CBC with Differential/Platelet - Hepatic function panel - metFORMIN  (GLUCOPHAGE -XR) 750 MG 24 hr tablet; Take 1 tablet (750 mg total) by mouth daily with breakfast.  Dispense: 90 tablet; Refill: 3  4. Elevated liver enzymes Routine lab ordered  - Hepatic function panel  5. Vitamin D  deficiency Routine lab ordered  - Vitamin D  (25 hydroxy)  6. B12 deficiency Routine lab ordered  - B12 and Folate Panel - Iron, TIBC and Ferritin Panel - CBC with Differential/Platelet  7. Generalized anxiety disorder Continue prn alprazolam  as prescribed.  - ALPRAZolam  (XANAX ) 0.5 MG tablet; Take 1-2 tablets (0.5-1 mg total) by mouth at bedtime as needed for anxiety. Take 1-2 tablets as needed at bedtime.  Dispense: 60 tablet; Refill: 2  8. Adult attention deficit disorder Continue adderall as prescribed. Follow up in 3 months for additional refills.  - amphetamine -dextroamphetamine  (ADDERALL XR) 15 MG 24 hr capsule; Take 1 capsule by mouth every morning.  Dispense: 30 capsule; Refill: 0 - amphetamine -dextroamphetamine  (ADDERALL XR) 15 MG 24 hr capsule; Take 1 capsule by mouth every morning.  Dispense: 30 capsule; Refill: 0 -  amphetamine -dextroamphetamine  (ADDERALL XR) 15 MG 24 hr capsule; Take 1 capsule by mouth every morning.  Dispense: 30 capsule; Refill: 0 - amphetamine -dextroamphetamine  (ADDERALL) 10 MG tablet; Take 1 tablet (10 mg total) by mouth daily as needed (ADHD symptoms).  Dispense: 90 tablet; Refill: 0  9. Dysuria Urine sent to lab - UA/M w/rflx Culture, Routine - Microscopic Examination  10. Encounter for screening mammogram for malignant neoplasm of breast (Primary) Routine mammogram ordered  - MM 3D SCREEN BREAST W/IMPLANT BILATERAL; Future      General Counseling: cynthya yam understanding of the findings of todays visit and agrees with plan of treatment. I have discussed any further diagnostic evaluation that may be needed or ordered today. We also reviewed her medications today. she has been encouraged to call the office with any questions or concerns that should arise related to todays visit.    Orders Placed This Encounter  Procedures   MM DIAG BREAST W/IMPLANT BILATERAL   TSH + free T4   Hgb A1C w/o eAG   B12 and Folate Panel   Vitamin D  (25 hydroxy)   Iron, TIBC and Ferritin Panel   CBC with Differential/Platelet   Hepatic function panel    Meds ordered this encounter  Medications   ALPRAZolam  (XANAX ) 0.5 MG tablet    Sig: Take 1-2 tablets (0.5-1 mg total) by mouth at bedtime as needed for anxiety. Take 1-2 tablets as needed at bedtime.    Dispense:  60 tablet    Refill:  2   amphetamine -dextroamphetamine  (ADDERALL XR) 15 MG 24 hr capsule    Sig: Take 1 capsule by mouth every morning.    Dispense:  30 capsule    Refill:  0    Note increased dose, fill script for march.   amphetamine -dextroamphetamine  (ADDERALL XR) 15 MG 24 hr capsule    Sig: Take 1 capsule by mouth every morning.    Dispense:  30 capsule    Refill:  0    Note increased dose, fill script for february   amphetamine -dextroamphetamine  (ADDERALL XR) 15 MG 24 hr capsule    Sig: Take 1 capsule by  mouth every morning.    Dispense:  30 capsule    Refill:  0    Note increased dose, fill script forjanuary   amphetamine -dextroamphetamine  (ADDERALL) 10 MG tablet    Sig: Take 1 tablet (10 mg total) by mouth daily as needed (ADHD symptoms).    Dispense:  90 tablet    Refill:  0    3 month supply of extra additional dose to be taken after taking her 20 mg XR adderall dose,   metFORMIN  (GLUCOPHAGE -XR) 750 MG 24  hr tablet    Sig: Take 1 tablet (750 mg total) by mouth daily with breakfast.    Dispense:  90 tablet    Refill:  3    Note increased dose, fill new script today, discontinue 500 mg dose.    Return in about 3 months (around 11/22/2024) for F/U, ADHD med check, Harland Aguiniga PCP.   Total time spent:30 Minutes Time spent includes review of chart, medications, test results, and follow up plan with the patient.   [place] Controlled Substance Database was reviewed by me.  This patient was seen by Mardy Maxin, FNP-C in collaboration with Dr. Sigrid Bathe as a part of collaborative care agreement.  Rosan Calbert R. Maxin, MSN, FNP-C Internal medicine

## 2024-09-05 ENCOUNTER — Encounter: Payer: Self-pay | Admitting: Nurse Practitioner

## 2024-09-05 LAB — UA/M W/RFLX CULTURE, ROUTINE
Bilirubin, UA: NEGATIVE
Glucose, UA: NEGATIVE
Ketones, UA: NEGATIVE
Leukocytes,UA: NEGATIVE
Nitrite, UA: NEGATIVE
RBC, UA: NEGATIVE
Urobilinogen, Ur: 0.2 mg/dL (ref 0.2–1.0)
pH, UA: 5 (ref 5.0–7.5)

## 2024-09-05 LAB — MICROSCOPIC EXAMINATION
Casts: NONE SEEN /LPF
RBC, Urine: NONE SEEN /HPF (ref 0–2)

## 2024-09-16 ENCOUNTER — Other Ambulatory Visit: Payer: Self-pay

## 2024-09-16 DIAGNOSIS — J3089 Other allergic rhinitis: Secondary | ICD-10-CM

## 2024-09-16 MED ORDER — FEXOFENADINE-PSEUDOEPHED ER 60-120 MG PO TB12: 1.0000 | ORAL_TABLET | Freq: Two times a day (BID) | ORAL | 2 refills | Status: AC

## 2024-09-18 ENCOUNTER — Encounter: Payer: Self-pay | Admitting: Nurse Practitioner

## 2024-09-19 MED ORDER — BIJUVA 1-100 MG PO CAPS: 1.0000 | ORAL_CAPSULE | Freq: Every day | ORAL | 5 refills | Status: AC

## 2024-11-18 ENCOUNTER — Ambulatory Visit: Admitting: Nurse Practitioner

## 2025-09-08 ENCOUNTER — Encounter: Admitting: Nurse Practitioner
